# Patient Record
Sex: Female | Born: 1950 | Race: Black or African American | Hispanic: No | Marital: Married | Smoking: Never smoker
Health system: Southern US, Community
[De-identification: ages and names within clinical notes are randomized; demographics above are authoritative.]

## PROBLEM LIST (undated history)

## (undated) DIAGNOSIS — I10 Essential (primary) hypertension: Secondary | ICD-10-CM

## (undated) DIAGNOSIS — E7800 Pure hypercholesterolemia, unspecified: Secondary | ICD-10-CM

## (undated) HISTORY — PX: TUBAL LIGATION: SHX77

## (undated) HISTORY — PX: CHOLECYSTECTOMY: SHX55

## (undated) HISTORY — DX: Pure hypercholesterolemia, unspecified: E78.00

---

## 1999-02-07 ENCOUNTER — Ambulatory Visit (HOSPITAL_COMMUNITY): Admission: RE | Admit: 1999-02-07 | Discharge: 1999-02-07 | Payer: Self-pay

## 2000-01-11 ENCOUNTER — Other Ambulatory Visit: Admission: RE | Admit: 2000-01-11 | Discharge: 2000-01-11 | Payer: Self-pay | Admitting: Obstetrics and Gynecology

## 2000-03-31 ENCOUNTER — Encounter: Payer: Self-pay | Admitting: Obstetrics and Gynecology

## 2000-03-31 ENCOUNTER — Ambulatory Visit (HOSPITAL_COMMUNITY): Admission: RE | Admit: 2000-03-31 | Discharge: 2000-03-31 | Payer: Self-pay | Admitting: Obstetrics and Gynecology

## 2001-11-26 ENCOUNTER — Encounter: Payer: Self-pay | Admitting: Obstetrics and Gynecology

## 2001-11-26 ENCOUNTER — Ambulatory Visit (HOSPITAL_COMMUNITY): Admission: RE | Admit: 2001-11-26 | Discharge: 2001-11-26 | Payer: Self-pay | Admitting: Obstetrics and Gynecology

## 2001-12-11 ENCOUNTER — Other Ambulatory Visit: Admission: RE | Admit: 2001-12-11 | Discharge: 2001-12-11 | Payer: Self-pay | Admitting: *Deleted

## 2002-08-28 ENCOUNTER — Inpatient Hospital Stay (HOSPITAL_COMMUNITY): Admission: EM | Admit: 2002-08-28 | Discharge: 2002-08-31 | Payer: Self-pay | Admitting: Emergency Medicine

## 2002-08-30 ENCOUNTER — Encounter: Payer: Self-pay | Admitting: Cardiology

## 2003-12-07 ENCOUNTER — Other Ambulatory Visit: Admission: RE | Admit: 2003-12-07 | Discharge: 2003-12-07 | Payer: Self-pay | Admitting: Obstetrics and Gynecology

## 2004-08-08 ENCOUNTER — Ambulatory Visit (HOSPITAL_COMMUNITY): Admission: RE | Admit: 2004-08-08 | Discharge: 2004-08-08 | Payer: Self-pay | Admitting: Nurse Practitioner

## 2004-10-25 ENCOUNTER — Encounter: Admission: RE | Admit: 2004-10-25 | Discharge: 2004-10-25 | Payer: Self-pay | Admitting: Family Medicine

## 2004-12-05 ENCOUNTER — Ambulatory Visit (HOSPITAL_COMMUNITY): Admission: RE | Admit: 2004-12-05 | Discharge: 2004-12-05 | Payer: Self-pay | Admitting: Gastroenterology

## 2004-12-05 ENCOUNTER — Encounter (INDEPENDENT_AMBULATORY_CARE_PROVIDER_SITE_OTHER): Payer: Self-pay | Admitting: *Deleted

## 2005-06-23 ENCOUNTER — Emergency Department (HOSPITAL_COMMUNITY): Admission: EM | Admit: 2005-06-23 | Discharge: 2005-06-23 | Payer: Self-pay | Admitting: Family Medicine

## 2005-07-22 ENCOUNTER — Ambulatory Visit (HOSPITAL_COMMUNITY): Admission: RE | Admit: 2005-07-22 | Discharge: 2005-07-22 | Payer: Self-pay | Admitting: Family Medicine

## 2005-08-26 ENCOUNTER — Other Ambulatory Visit: Admission: RE | Admit: 2005-08-26 | Discharge: 2005-08-26 | Payer: Self-pay | Admitting: Obstetrics and Gynecology

## 2006-02-03 ENCOUNTER — Ambulatory Visit (HOSPITAL_COMMUNITY): Admission: RE | Admit: 2006-02-03 | Discharge: 2006-02-03 | Payer: Self-pay | Admitting: Family Medicine

## 2006-10-27 ENCOUNTER — Other Ambulatory Visit: Admission: RE | Admit: 2006-10-27 | Discharge: 2006-10-27 | Payer: Self-pay | Admitting: Obstetrics & Gynecology

## 2007-05-18 ENCOUNTER — Ambulatory Visit (HOSPITAL_COMMUNITY): Admission: RE | Admit: 2007-05-18 | Discharge: 2007-05-18 | Payer: Self-pay | Admitting: Family Medicine

## 2007-06-24 ENCOUNTER — Ambulatory Visit: Payer: Self-pay | Admitting: Cardiology

## 2007-07-01 ENCOUNTER — Ambulatory Visit: Payer: Self-pay

## 2007-10-20 ENCOUNTER — Other Ambulatory Visit: Admission: RE | Admit: 2007-10-20 | Discharge: 2007-10-20 | Payer: Self-pay | Admitting: Obstetrics and Gynecology

## 2008-04-15 ENCOUNTER — Emergency Department (HOSPITAL_COMMUNITY): Admission: EM | Admit: 2008-04-15 | Discharge: 2008-04-15 | Payer: Self-pay | Admitting: Emergency Medicine

## 2008-05-18 ENCOUNTER — Ambulatory Visit (HOSPITAL_COMMUNITY): Admission: RE | Admit: 2008-05-18 | Discharge: 2008-05-18 | Payer: Self-pay | Admitting: Internal Medicine

## 2009-06-23 ENCOUNTER — Ambulatory Visit (HOSPITAL_COMMUNITY): Admission: RE | Admit: 2009-06-23 | Discharge: 2009-06-23 | Payer: Self-pay | Admitting: Internal Medicine

## 2010-03-01 ENCOUNTER — Telehealth (INDEPENDENT_AMBULATORY_CARE_PROVIDER_SITE_OTHER): Payer: Self-pay | Admitting: *Deleted

## 2010-07-25 ENCOUNTER — Ambulatory Visit (HOSPITAL_COMMUNITY): Admission: RE | Admit: 2010-07-25 | Discharge: 2010-07-25 | Payer: Self-pay | Admitting: Internal Medicine

## 2010-11-29 NOTE — Progress Notes (Signed)
  Faxed Stress over to [facility] [phone] [facility]  Mar 01, 2010 2:20 PM

## 2011-03-12 NOTE — Assessment & Plan Note (Signed)
[facility]                            CARDIOLOGY OFFICE NOTE   Kathryn Mendez, Kathryn Mendez                MRN:          [id number]  DATE:06/24/2007                            DOB:          Aug 18, 1951    Kathryn Mendez has had chest discomfort.  She is referred by Dr.  Andi Devon for cardiac evaluation.  In the past, Kathryn Mendez  has had some chest pain.  There is no proven coronary disease.  She had  a Cardiolite in the past and a 2D echo.  Also, she has a history of  chronically elevated troponins in the range of 0.18.  She has a history  of hypertension, and peripheral edema, and has been on a diuretic.  The  patient does do physical work.  Recently, she has had localized chest  discomfort in the left anterior chest.  It comes and goes.  It may last  for a few minutes.  There was a question of some radiation to the left  arm.  She has had no nausea, vomiting, or diaphoresis.  There has been  no significant shortness of breath with it.  An EKG done last week  showed no diagnostic changes.  She is now here for further evaluation.  Other risk factors include her history of hypertension.  It is of  interest that she historically has a high HDL.   PAST MEDICAL HISTORY:   ALLERGIES:  No known drug allergies.   MEDICATIONS:  1. Aspirin 81 every other day.  2. Potassium 20.  3. Lotrel 10/20.  4. Furosemide 20 daily.  5. Toprol XL 100.  6. Vitamin D.  7. Teveten 600 mg daily.   OTHER MEDICAL PROBLEMS:  See the list below.   REVIEW OF SYSTEMS:  Other than the chest discomfort described in the  history of present illness, her review of systems is negative.   PHYSICAL EXAMINATION:  Blood pressure is 130/74 with a pulse of 64.  The patient is oriented to person, time, and place.  Affect is normal.  HEENT:  Reveals no xanthelasma.  She has no carotid bruits.  There is no  jugular venous distension.  LUNGS:  Clear.  Respiratory effort is  not labored.  CARDIAC EXAM:  Reveals an S1 with an S2.  There are no clicks or  significant murmurs.  Her abdomen is soft.  There are no masses or bruits.  She has normal  bowel sounds.  She does have 1+ edema, slightly greater in the right ankle than the  left ankle.   Her EKG from June 11, 2007 shows no acute abnormalities.   I also have labs that had been done earlier dated May 08, 2007.  Her  total LDL was 164.  Her HDL was 70.  Triglycerides were 58.  BUN was 19  with a creatinine of 0.87, potassium was 3.7, hemoglobin was 13.7.   PROBLEMS:  1. History of a chronically elevated troponin in the range of 0.18.  2. History of labile hypertension.  Blood pressure is nicely      controlled at this time.  3. History of chronic  peripheral edema, on diuretics, and this is      relatively stable.  4. History of elevated HDL.  5. Most recent elevated LDL, and this will need followup.  6. History in the past of normal left ventricular function.  7. Current chest discomfort that she has had intermittently for      several weeks.  At this point, I do not believe she has unstable      cardiac pain.  However, we need to assess further whether this      current pain is ischemic or not.  I believe she is stable for an      exercise test.  We will arrange for an adenosine Myoview scan.  I      will then see her for followup after this.     Luis Abed, MD, [facility]  Electronically Signed    JDK/MedQ  DD: 06/24/2007  DT: 06/25/2007  Job #: [id number]   cc:   Merlene Laughter. Renae Gloss, M.D.

## 2011-03-15 NOTE — Assessment & Plan Note (Signed)
[facility]                            CARDIOLOGY OFFICE NOTE   [name], [name]                MRN:          [id number]  DATE:07/01/2007                            DOB:          [birth year]    The patient has had a nuclear scan which shows no ischemia.  Review of  the raw data of her study reveals that there is uptake in her left  breast.  Etiology is not known but this is a concerning finding.  I have  called Dr. Selena Batten Shelton's office and spoken to her nurse.  I explained  this finding and the nurse will let Dr. Renae Gloss know who will follow up  next week concerning further evaluation of the patient's breast.   We are also in the process of being in touch with the patient, herself,  this afternoon to make sure that she knows that she will need breast  followup through Dr. Mathews Robinsons office.     Luis Abed, MD, [facility]  Electronically Signed    JDK/MedQ  DD: 07/02/2007  DT: 07/02/2007  Job #: [id number]

## 2011-03-15 NOTE — H&P (Signed)
NAME:  Kathryn Mendez, Kathryn Mendez NO.:  [id number]   MEDICAL RECORD NO.:  [id number]                   PATIENT TYPE:  EMS   LOCATION:  MAJO                                 FACILITY:  [facility]   PHYSICIAN:  Willa Rough, M.D. [facility]              DATE OF BIRTH:  04-02-1951   DATE OF ADMISSION:  08/27/2002  DATE OF DISCHARGE:                                HISTORY & PHYSICAL   HISTORY:  The patient has had some chest discomfort for two weeks.  She was  encouraged to come to the emergency room midday today and came in this  evening.  While here, her blood pressure is 200/100 and she is volume  overloaded with significant peripheral edema.  There is no evidence of an  acute MI. However, considering all of the issues above, it is necessary to  admit her for further evaluation and treatment.   PAST MEDICAL HISTORY:  A. ALLERGIES:  NONE KNOWN.  B.  MEDICATIONS:  She is currently on Allegra, Singulair, Prinizide  (combination of Lisinopril and Hydrochlorothiazide), and Toprol XL 25 mg  started today.  C.  Old Medical problems:  See the complete problem list below.   SOCIAL HISTORY:  The patient does not smoke or use street drugs.  She does  have occasional alcohol.   FAMILY HISTORY:  There is a family history of coronary disease with the  father having an MI in his 16s.   REVIEW OF SYSTEMS:  She has had no fevers or chills.  There has been no  obvious cough.  She has had ankle swelling.  She has had no major headaches  and no syncopal or presyncopal changes.  She has had no problems with her  vision.  There have been no major GI or GU problems.  The remainder of her  review of systems is negative.   PHYSICAL EXAMINATION:  VITAL SIGNS:  Blood pressure in the emergency room is  198/100.  Temperature is 97.8, respirations are 18, and her pulse rate is  85.  GENERAL:  Overall, she is stable.  She seems minimally uncomfortable from  her chest discomfort and has a hard time  describing exactly what her  problems are this evening.  LUNGS:  Reveal no obvious rales.  NECK:  No bruits.  CARDIAC:  S1 with an S2, but no clicks or significant murmurs.  ABDOMEN:  Mildly distended, but soft.  Bowel sounds are normal.  EXTREMITIES:  She does have 2+ peripheral edema in the lower extremities.   Chest x-ray is pending at this time.  EKG reveals nonspecific ST changes.  All of her blood studies are still pending.   IMPRESSION:  1. Hypertension.  The patient is on hypertensive meds.  She claims that her     pressure became worse when her Prinizide was changed to generic     Lisinopril/Hydrochlorothiazide and possibly worse when she was started on  Allegra and Singulair.  I did speak with her about salt and fluid intake.     She does drink an excessive amount of fluid and this may play a role in     some of her problems.  2. Allergic symptoms.  For this she was started on Allegra and Singulair.  3. Chest pressure for two weeks.  This does not seem classic for cardiac     ischemia, but will require further workup.  It is difficult to decide if     the pain is worse sitting up or lying down.  4. Peripheral edema.  This is worse over the last two weeks.  I am not sure     if this is related to changing cardiac function or purely her salt and     fluid intake and variation in her medications.  5. Significant family history of coronary disease.   The patient has the above constellation of issues.  It is still not clear to  me what her primary issue is this evening.  However, we do need to rule out  an MI and to treat her blood pressure.  Blood pressure will be treated.  She  will need other medications along with more powerful diuretics.  We will  check a series of cardiac enzymes.  Chest x-ray will be done.  Cardiolite  scan will be ordered for 08/29/2002, and a 2-D echo for 08/30/2002.  We will  talk to her about watching her salt and fluid intake over time.  When we   have the above studies done, we can decide if further cardiac workup is  necessary or merely adjusting medications and then sending her home for  careful follow up.                                                Willa Rough, M.D. [facility]    JK/MEDQ  D:  08/28/2002  T:  08/28/2002  Job:  [id number]   cc:   Olena Leatherwood [facility]

## 2011-03-15 NOTE — Discharge Summary (Signed)
NAME:  Kathryn Mendez, Kathryn Mendez NO.:  [id number]   MEDICAL RECORD NO.:  [id number]                   PATIENT TYPE:  INP   LOCATION:  3736                                 FACILITY:  [facility]   PHYSICIAN:  Willa Rough, M.D. [facility]              DATE OF BIRTH:  Sep 16, 1951   DATE OF ADMISSION:  08/27/2002  DATE OF DISCHARGE:  08/31/2002                           DISCHARGE SUMMARY - REFERRING   PROCEDURES:  Adenosine Cardiolite 08/30/2002.   REASON FOR ADMISSION:  The patient is a 60 year old female with no prior  history of heart disease who presented to [facility] Room with  progressive chest discomfort over the preceding two weeks.  She was found to  have elevated blood pressure on admission (200/100) and evidence of  significant peripheral edema.  She was admitted for rule MI and management  of uncontrolled hypertension as well as further diagnostic evaluation.  Please refer to dictated admission not for full details.   LABORATORY DATA:  CBC normal.  Sodium 134, potassium 3.0 on admission;  sodium 138, potassium 3.6 at discharge.  Normal renal function.  Glucose 137  on admission, followup within normal limits.  Normal liver enzymes.  Cardiac  enzymes: CPK-MB normal x 3; peak troponin I was 0.18 (on admission).  TSH  3.27.   Admission chest x-ray showed no active disease.   HOSPITAL COURSE:  Serial cardiac enzymes were performed revealing normal CPK-  MB levels with mildly elevated troponin I (peak 0.18) on admission.  Initial  recommendation was to proceed with noninvasive diagnostic testing: 2-D  echocardiography and pharmacologic stress testing. However, low threshold  was maintained to proceed with diagnostic coronary angiography if there were  any abnormality on the perfusion imaging study.   The 2-D echocardiogram revealed normal left ventricular function with no  significant valvular abnormalities.  The right ventricle was also within  normal  limits.   Adenosine Cardiolite was negative for ischemia, ejection fraction 81%.  There was the question of mild anterolateral graft attenuation.   The patient had no return of chest pain following admission and was  ambulating without complaints by time of discharge on hospital day #3.   Medications were adjusted at the outset with discontinuation of Prinizide  and subsequently treated with stronger diuretic, Lasix.  The patient was  initially treated with IV Lasix 40 mg and subsequently continued on oral  dose.  Recommendation at time of discharge was to cut this back to a  maintenance dose of 20 mEq q.d.   Hypokalemia (3.0) was corrected with supplementation.  Potassium was 3.6 at  time of discharge.   The patient was also started on Norvasc at 5 mg q.d.  Her blood pressure  responded nicely to these therapeutic interventions with normalization of  her blood pressure at time of discharge.   The patient also had significant improvement in her lower extremity edema  with no significant swelling noted at time of  scheduled discharge.   Final recommendations at time of discharge were to proceed with a followup  metabolic profile in one week for monitoring of potassium levels.   Following full review of all of the diagnostic test results, no further  cardiac workup was recommended.  Recommendations are to proceed with  aggressive management of blood pressure and volume status.   DISCHARGE MEDICATIONS:  1. Norvasc 5 mg q.d. (new).  2. Lasix 20 mg q.d. (new).  3. K-Dur 20 mEq q.d. (new).  4. Lisinopril 20 mg q.d.  5. Toprol XL 25 mg q.d.  6. Coated aspirin 81 mg q.d. (new).  7. Allegra 60 mg as directed.   DISCHARGE INSTRUCTIONS:  The patient is to return to baseline level of  activity as tolerated.  She is to refrain from adding salt to her diet.   The patient is instructed to follow up with Dr. Clydie Braun L. Hal Hope, of [employer], in approximately one week.   She will need a followup  metabolic profile at that time for monitoring of hypokalemia.  The patient  is scheduled to follow up with Dr. Willa Rough on Monday, November 24, at  10:45 a.m.   DISCHARGE DIAGNOSES:  1. Nonischemic chest pain.     a. Normal adenosine Cardiolite; ejection fraction 81% on November 3.     b. Normal 2-D echocardiogram.     c. Mildly elevated troponin markers of undetermined etiology.  2. Labile hypertension.  3. Peripheral edema.  4. History of elevated high density lipoprotein (HDL).     Gene Serpe, P.A. [facility]                      Willa Rough, M.D. [facility]    GS/MEDQ  D:  08/31/2002  T:  08/31/2002  Job:  [id number]   cc:   Clydie Braun L. Hal Hope, M.D.  [address] [place]  [place] [address]  Fax: [phone]

## 2011-03-15 NOTE — Op Note (Signed)
NAME:  Kathryn Mendez, Kathryn Mendez         ACCOUNT NO.:  [id number]   MEDICAL RECORD NO.:  [id number]          PATIENT TYPE:  AMB   LOCATION:  ENDO                         FACILITY:  [facility]   PHYSICIAN:  Anselmo Rod, M.D.  DATE OF BIRTH:  July 02, 1951   DATE OF PROCEDURE:  12/05/2004  DATE OF DISCHARGE:                                 OPERATIVE REPORT   PROCEDURE PERFORMED:  Colonoscopy with cold biopsies x4.   ENDOSCOPIST:  Anselmo Rod, M.D.   INSTRUMENT USED:  Olympus video colonoscope.   INDICATIONS FOR PROCEDURE:  A 60 year old, African-American female underwent  screening colonoscopy to rule out colonic polyps, masses, etc.   PREPROCEDURE PREPARATION:  Informed consent was procured from the patient.  The patient was fasted for eight hours prior to the procedure and prepped  with a bottle of magnesium citrate and a gallon of GoLYTELY the night prior  to the procedure.  Risks and benefits of the procedure, including a 10% miss  rate of cancer and polyps, were discussed with the patient as well.  The  patient received 1 g of Ancef for prophylaxis prior to the procedure.   PREPROCEDURE PHYSICAL:  VITAL SIGNS:  The patient had stable vital signs.  NECK:  Supple.  CHEST:  Clear to auscultation.  CARDIOVASCULAR:  S1, S2 regular.  ABDOMEN:  Soft, with normal bowel sounds.   DESCRIPTION OF THE PROCEDURE:  The patient was placed in the left lateral  decubitus position, sedated with 50 mg of Demerol and 5 mg of Versed in slow  incremental doses.  Once the patient was adequately sedated and maintained  on low-flow oxygen and continuous cardiac monitoring, the Olympus video  colonoscope was advanced from the rectum to the cecum.  The appendiceal  orifice and the ileocecal valve were clearly visualized and photographed.  A  small sessile polyp was biopsied from the mid-right colon, another small  sessile polyp biopsied from the rectum.  Prominent internal hemorrhoids were  appreciated on  retroflexion in the rectum.  The patient tolerated the  procedure well, without immediate complications.   IMPRESSION:  1.  Two polyps removed from the colon, one from the mid-right colon, one      from the rectum.  2.  Prominent internal hemorrhoids.  3.  No large masses or polyps seen.  4.  No evidence of diverticulosis.   RECOMMENDATIONS:  1.  Await pathology results.  2.  Avoid nonsteroidals, including aspirin, for the next two weeks.  3.  Repeat colonoscopy depending on pathology results.      JNM/MEDQ  D:  12/05/2004  T:  12/05/2004  Job:  [id number]   cc:   Saul Fordyce, PA-C   Ernestina Penna, M.D.  [address] [place]  [place] [address]  Fax: [phone]

## 2012-02-05 ENCOUNTER — Other Ambulatory Visit (HOSPITAL_COMMUNITY): Payer: Self-pay | Admitting: Internal Medicine

## 2012-02-05 DIAGNOSIS — Z1231 Encounter for screening mammogram for malignant neoplasm of breast: Secondary | ICD-10-CM

## 2012-03-03 ENCOUNTER — Ambulatory Visit (HOSPITAL_COMMUNITY)
Admission: RE | Admit: 2012-03-03 | Discharge: 2012-03-03 | Disposition: A | Discharge status: Home / Self Care | Payer: BC Managed Care – PPO | Source: Ambulatory Visit | Attending: Internal Medicine | Admitting: Internal Medicine

## 2012-03-03 DIAGNOSIS — Z1231 Encounter for screening mammogram for malignant neoplasm of breast: Secondary | ICD-10-CM | POA: Insufficient documentation

## 2013-01-22 ENCOUNTER — Other Ambulatory Visit (HOSPITAL_COMMUNITY): Payer: Self-pay | Admitting: Internal Medicine

## 2013-01-22 DIAGNOSIS — Z1231 Encounter for screening mammogram for malignant neoplasm of breast: Secondary | ICD-10-CM

## 2013-02-11 DIAGNOSIS — R739 Hyperglycemia, unspecified: Secondary | ICD-10-CM | POA: Insufficient documentation

## 2013-03-05 ENCOUNTER — Ambulatory Visit (HOSPITAL_COMMUNITY)
Admission: RE | Admit: 2013-03-05 | Discharge: 2013-03-05 | Disposition: A | Discharge status: Home / Self Care | Payer: BC Managed Care – PPO | Source: Ambulatory Visit | Attending: Internal Medicine | Admitting: Internal Medicine

## 2013-03-05 DIAGNOSIS — Z1231 Encounter for screening mammogram for malignant neoplasm of breast: Secondary | ICD-10-CM | POA: Insufficient documentation

## 2013-11-20 ENCOUNTER — Emergency Department (HOSPITAL_COMMUNITY)
Admission: EM | Admit: 2013-11-20 | Discharge: 2013-11-20 | Disposition: A | Discharge status: Home / Self Care | Payer: BC Managed Care – PPO | Source: Home / Self Care | Attending: Emergency Medicine | Admitting: Emergency Medicine

## 2013-11-20 ENCOUNTER — Encounter (HOSPITAL_COMMUNITY): Payer: Self-pay | Admitting: Emergency Medicine

## 2013-11-20 DIAGNOSIS — L509 Urticaria, unspecified: Secondary | ICD-10-CM

## 2013-11-20 HISTORY — DX: Essential (primary) hypertension: I10

## 2013-11-20 MED ORDER — PREDNISONE 10 MG PO KIT: PACK | ORAL | Status: DC

## 2013-11-20 MED ORDER — CETIRIZINE HCL 10 MG PO TABS
10.0000 mg | ORAL_TABLET | Freq: Two times a day (BID) | ORAL | Status: DC | PRN
Start: 2013-11-20 — End: 2018-11-24

## 2013-11-20 MED ORDER — PREDNISONE 20 MG PO TABS
60.0000 mg | ORAL_TABLET | Freq: Once | ORAL | Status: AC
  Administered 2013-11-20: 60 mg via ORAL

## 2013-11-20 MED ORDER — PREDNISONE 20 MG PO TABS
ORAL_TABLET | ORAL | Status: AC
  Filled 2013-11-20: qty 3

## 2013-11-20 MED ORDER — RANITIDINE HCL 150 MG PO CAPS: 150.0000 mg | ORAL_CAPSULE | Freq: Two times a day (BID) | ORAL | Status: DC | PRN

## 2013-11-20 NOTE — ED Notes (Signed)
Started with raised, pruritic rash to entire body on Friday; has bathed in [facility] and applied rubbing alcohol.  Denies any changes in soaps, lotions, etc.

## 2013-11-20 NOTE — ED Provider Notes (Signed)
Medical screening examination/treatment/procedure(s) were performed by non-physician practitioner and as supervising physician I was immediately available for consultation/collaboration.  Philipp Deputy, M.D.   Harden Mo, MD 11/20/13 [phone]

## 2013-11-20 NOTE — ED Provider Notes (Signed)
CSN: [id number]     Arrival date & time 11/20/13  0901 History   First MD Initiated Contact with Patient 11/20/13 [phone]     Chief Complaint  Patient presents with  . Urticaria   (Consider location/radiation/quality/duration/timing/severity/associated sxs/prior Treatment) HPI Comments: 63 year old female presents complaining of rash. For 2 days, she has had an itchy, raised, red rash all over her skin. It started on her arms and has since spread over her entire trunk, arms, legs. It is red and itchy. She denies shortness of breath, wheezing, diarrhea. She has no known exposure to any allergens. She has never had this before, although she did get a mild rash due to a detergent years ago.  Patient is a 63 y.o. female presenting with urticaria.  Urticaria Pertinent negatives include no chest pain, no abdominal pain and no shortness of breath.    Past Medical History  Diagnosis Date  . Hypertension    Past Surgical History  Procedure Laterality Date  . Cholecystectomy    . Tubal ligation     History reviewed. No pertinent family history. History  Substance Use Topics  . Smoking status: Never Smoker   . Smokeless tobacco: Not on file  . Alcohol Use: Yes     Comment: occasional   OB History   Grav Para Term Preterm Abortions TAB SAB Ect Mult Living                 Review of Systems  Constitutional: Negative for fever and chills.  Eyes: Negative for visual disturbance.  Respiratory: Negative for cough and shortness of breath.   Cardiovascular: Negative for chest pain, palpitations and leg swelling.  Gastrointestinal: Negative for nausea, vomiting and abdominal pain.  Endocrine: Negative for polydipsia and polyuria.  Genitourinary: Negative for dysuria, urgency and frequency.  Musculoskeletal: Negative for arthralgias and myalgias.  Skin: Positive for rash.  Neurological: Negative for dizziness, weakness and light-headedness.    Allergies  Other  Home Medications    Current Outpatient Rx  Name  Route  Sig  Dispense  Refill  . amLODipine (NORVASC) 5 MG tablet   Oral   Take 5 mg by mouth daily.         Marland Kitchen aspirin 81 MG tablet   Oral   Take 81 mg by mouth daily.         . metoprolol succinate (TOPROL-XL) 100 MG 24 hr tablet   Oral   Take 100 mg by mouth daily. Take with or immediately following a meal.         . valsartan-hydrochlorothiazide (DIOVAN-HCT) 320-25 MG per tablet   Oral   Take 1 tablet by mouth daily.         . cetirizine (ZYRTEC) 10 MG tablet   Oral   Take 1 tablet (10 mg total) by mouth 2 (two) times daily as needed (for itching or rash).   30 tablet   0   . PredniSONE 10 MG KIT      12 day taper dose pack. Use as directed   48 each   0   . ranitidine (ZANTAC) 150 MG capsule   Oral   Take 1 capsule (150 mg total) by mouth 2 (two) times daily as needed (for itching or rash).   30 capsule   0    BP 170/75  Pulse 95  Temp(Src) 97.6 F (36.4 C) (Oral)  Resp 16  SpO2 96% Physical Exam  Nursing note and vitals reviewed. Constitutional: She is oriented to  person, place, and time. Vital signs are normal. She appears well-developed and well-nourished. No distress.  HENT:  Head: Normocephalic and atraumatic.  Pulmonary/Chest: Effort normal and breath sounds normal. No accessory muscle usage. Not tachypneic. No respiratory distress. She has no wheezes.  Neurological: She is alert and oriented to person, place, and time. She has normal strength. Coordination normal.  Skin: Skin is warm and dry. Rash noted. Rash is urticarial (diffuse). She is not diaphoretic.  Psychiatric: She has a normal mood and affect. Judgment normal.    ED Course  Procedures (including critical care time) Labs Review Labs Reviewed - No data to display Imaging Review No results found.    MDM   1. Urticaria    Giving 60 mg of prednisone here, discharge and with prednisone, Zyrtec, Zantac. Followup if there is any worsening.  New  Prescriptions   CETIRIZINE (ZYRTEC) 10 MG TABLET    Take 1 tablet (10 mg total) by mouth 2 (two) times daily as needed (for itching or rash).   PREDNISONE 10 MG KIT    12 day taper dose pack. Use as directed   RANITIDINE (ZANTAC) 150 MG CAPSULE    Take 1 capsule (150 mg total) by mouth 2 (two) times daily as needed (for itching or rash).     Liam Graham, PA-C 11/20/13 [phone]

## 2013-11-20 NOTE — Discharge Instructions (Signed)

## 2014-02-15 ENCOUNTER — Other Ambulatory Visit (HOSPITAL_COMMUNITY): Payer: Self-pay | Admitting: Internal Medicine

## 2014-02-15 DIAGNOSIS — Z1231 Encounter for screening mammogram for malignant neoplasm of breast: Secondary | ICD-10-CM

## 2014-03-07 ENCOUNTER — Ambulatory Visit (HOSPITAL_COMMUNITY)
Admission: RE | Admit: 2014-03-07 | Discharge: 2014-03-07 | Disposition: A | Discharge status: Home / Self Care | Payer: BC Managed Care – PPO | Source: Ambulatory Visit | Attending: Internal Medicine | Admitting: Internal Medicine

## 2014-03-07 DIAGNOSIS — Z1231 Encounter for screening mammogram for malignant neoplasm of breast: Secondary | ICD-10-CM | POA: Insufficient documentation

## 2015-03-07 ENCOUNTER — Other Ambulatory Visit (HOSPITAL_COMMUNITY): Payer: Self-pay | Admitting: Internal Medicine

## 2015-03-07 DIAGNOSIS — Z1231 Encounter for screening mammogram for malignant neoplasm of breast: Secondary | ICD-10-CM

## 2015-03-13 ENCOUNTER — Ambulatory Visit (HOSPITAL_COMMUNITY)
Admission: RE | Admit: 2015-03-13 | Discharge: 2015-03-13 | Disposition: A | Discharge status: Home / Self Care | Payer: BLUE CROSS/BLUE SHIELD | Source: Ambulatory Visit | Attending: Internal Medicine | Admitting: Internal Medicine

## 2015-03-13 ENCOUNTER — Encounter (INDEPENDENT_AMBULATORY_CARE_PROVIDER_SITE_OTHER): Payer: Self-pay

## 2015-03-13 DIAGNOSIS — Z1231 Encounter for screening mammogram for malignant neoplasm of breast: Secondary | ICD-10-CM

## 2016-02-02 ENCOUNTER — Other Ambulatory Visit: Payer: Self-pay

## 2016-02-02 DIAGNOSIS — Z1231 Encounter for screening mammogram for malignant neoplasm of breast: Secondary | ICD-10-CM

## 2016-03-13 ENCOUNTER — Ambulatory Visit
Admission: RE | Admit: 2016-03-13 | Discharge: 2016-03-13 | Disposition: A | Discharge status: Home / Self Care | Payer: BLUE CROSS/BLUE SHIELD | Source: Ambulatory Visit

## 2016-03-13 DIAGNOSIS — Z1231 Encounter for screening mammogram for malignant neoplasm of breast: Secondary | ICD-10-CM

## 2016-08-19 ENCOUNTER — Other Ambulatory Visit: Payer: Self-pay | Admitting: Obstetrics and Gynecology

## 2016-08-19 DIAGNOSIS — D259 Leiomyoma of uterus, unspecified: Secondary | ICD-10-CM | POA: Insufficient documentation

## 2016-09-01 ENCOUNTER — Encounter (HOSPITAL_COMMUNITY): Payer: Self-pay | Admitting: Family Medicine

## 2016-09-01 ENCOUNTER — Ambulatory Visit (HOSPITAL_COMMUNITY)
Admission: EM | Admit: 2016-09-01 | Discharge: 2016-09-01 | Disposition: A | Discharge status: Home / Self Care | Payer: BLUE CROSS/BLUE SHIELD | Attending: Emergency Medicine | Admitting: Emergency Medicine

## 2016-09-01 DIAGNOSIS — R221 Localized swelling, mass and lump, neck: Secondary | ICD-10-CM

## 2016-09-01 DIAGNOSIS — R220 Localized swelling, mass and lump, head: Secondary | ICD-10-CM

## 2016-09-01 DIAGNOSIS — T783XXA Angioneurotic edema, initial encounter: Secondary | ICD-10-CM

## 2016-09-01 MED ORDER — DEXAMETHASONE SODIUM PHOSPHATE 10 MG/ML IJ SOLN
INTRAMUSCULAR | Status: AC
  Filled 2016-09-01: qty 1

## 2016-09-01 MED ORDER — DEXAMETHASONE SODIUM PHOSPHATE 10 MG/ML IJ SOLN
8.0000 mg | Freq: Once | INTRAMUSCULAR | Status: AC
  Administered 2016-09-01: 8 mg via INTRAMUSCULAR

## 2016-09-01 MED ORDER — DIPHENHYDRAMINE HCL 50 MG/ML IJ SOLN
25.0000 mg | Freq: Once | INTRAMUSCULAR | Status: AC
  Administered 2016-09-01: 25 mg via INTRAMUSCULAR

## 2016-09-01 MED ORDER — METHYLPREDNISOLONE ACETATE 40 MG/ML IJ SUSP
INTRAMUSCULAR | Status: AC
  Filled 2016-09-01: qty 1

## 2016-09-01 MED ORDER — METHYLPREDNISOLONE ACETATE 40 MG/ML IJ SUSP
40.0000 mg | Freq: Once | INTRAMUSCULAR | Status: AC
  Administered 2016-09-01: 40 mg via INTRAMUSCULAR

## 2016-09-01 MED ORDER — DIPHENHYDRAMINE HCL 50 MG/ML IJ SOLN
INTRAMUSCULAR | Status: AC
Start: 2016-09-01 — End: 2016-09-01
  Filled 2016-09-01: qty 1

## 2016-09-01 MED ORDER — PREDNISONE 10 MG PO TABS: 20.0000 mg | ORAL_TABLET | Freq: Every day | ORAL | 0 refills | Status: DC

## 2016-09-01 NOTE — ED Provider Notes (Signed)
CSN: [id number]     Arrival date & time 09/01/16  1159 History   First MD Initiated Contact with Patient 09/01/16 1215     Chief Complaint  Patient presents with  . Oral Swelling   (Consider location/radiation/quality/duration/timing/severity/associated sxs/prior Treatment) 65 year old female states that this morning she developed a sensation of swelling of the tongue and in the upper throat. Denies cough or trouble breathing. Denies sense of wheezing or peripheral swelling. Denies chest pain, shortness of breath or rash. She states a similar sensation of swelling occurred about a week ago and lasted 2-3 days. She states it was not quite as bad as she perceives this to be today. She has a history of hypertension and takes 3 medications, amlodipine, Toprol XL 100 mg and valsartan/hydrochlorothiazide 320-25. She denies ever having a similar reaction to medications, foods or other known substances. She otherwise feels generally well.      Past Medical History:  Diagnosis Date  . Hypertension    Past Surgical History:  Procedure Laterality Date  . CHOLECYSTECTOMY    . TUBAL LIGATION     History reviewed. No pertinent family history. Social History  Substance Use Topics  . Smoking status: Never Smoker  . Smokeless tobacco: Never Used  . Alcohol use Yes     Comment: occasional   OB History    No data available     Review of Systems  Constitutional: Negative.  Negative for fatigue and fever.  HENT: Negative for congestion, ear discharge, mouth sores, nosebleeds, sinus pressure, sore throat and trouble swallowing.        Sense of swelling to the right lower jaw upper right anterior neck.  Eyes: Negative.   Respiratory: Negative.  Negative for cough, choking, chest tightness, shortness of breath, wheezing and stridor.   Cardiovascular: Negative for chest pain and leg swelling.  Gastrointestinal: Negative.   Skin: Negative.  Negative for rash.  Neurological: Negative.      Allergies  Other  Home Medications   Prior to Admission medications   Medication Sig Start Date End Date Taking? Authorizing Provider  amLODipine (NORVASC) 5 MG tablet Take 5 mg by mouth daily.    Historical Provider, MD  aspirin 81 MG tablet Take 81 mg by mouth daily.    Historical Provider, MD  cetirizine (ZYRTEC) 10 MG tablet Take 1 tablet (10 mg total) by mouth 2 (two) times daily as needed (for itching or rash). 11/20/13   Liam Graham, PA-C  metoprolol succinate (TOPROL-XL) 100 MG 24 hr tablet Take 100 mg by mouth daily. Take with or immediately following a meal.    Historical Provider, MD  PredniSONE 10 MG KIT 12 day taper dose pack. Use as directed 11/20/13   Liam Graham, PA-C  ranitidine (ZANTAC) 150 MG capsule Take 1 capsule (150 mg total) by mouth 2 (two) times daily as needed (for itching or rash). 11/20/13   Freeman Caldron Baker, PA-C  valsartan-hydrochlorothiazide (DIOVAN-HCT) 320-25 MG per tablet Take 1 tablet by mouth daily.    Historical Provider, MD   Meds Ordered and Administered this Visit   Medications  dexamethasone (DECADRON) injection 8 mg (8 mg Intramuscular Given 09/01/16 1331)  methylPREDNISolone acetate (DEPO-MEDROL) injection 40 mg (40 mg Intramuscular Given 09/01/16 1332)  diphenhydrAMINE (BENADRYL) injection 25 mg (25 mg Intramuscular Given 09/01/16 1333)    BP 165/84   Pulse 68   Temp 97.5 F (36.4 C)   Resp 18   SpO2 100%  No data found.  Physical Exam  Constitutional: She is oriented to person, place, and time. She appears well-developed and well-nourished. No distress.  HENT:  Head: Normocephalic and atraumatic.  Right Ear: External ear normal.  Left Ear: External ear normal.  Nose: Nose normal.  Mouth/Throat: Oropharynx is clear and moist. No oropharyngeal exudate.  Initial exam of the tongue reveals no obvious swelling. More detailed exam revealed slight widening of the tongue on the left and right, greater on the right. This is  relatively nominal. The oropharynx is clear with evidence of clear PND. Soft palate without swelling erythema. The uvula with no edema, swelling or enlargement. Airway is widely patent. No stridor or other abnormal airway sounds. No swelling of the lips.  Eyes: Conjunctivae and EOM are normal.  Neck: Normal range of motion. Neck supple. No tracheal deviation present. No thyromegaly present.  No observed swelling or masses to the anterior neck.  Cardiovascular: Normal rate, regular rhythm, normal heart sounds and intact distal pulses.   Pulmonary/Chest: Effort normal and breath sounds normal. No respiratory distress. She has no wheezes. She has no rales. She exhibits no tenderness.  Musculoskeletal: She exhibits no edema.  Lymphadenopathy:    She has no cervical adenopathy.  Neurological: She is alert and oriented to person, place, and time. She exhibits normal muscle tone.  Skin: Skin is warm and dry. Capillary refill takes less than 2 seconds.  Psychiatric: She has a normal mood and affect. Her behavior is normal.  Nursing note and vitals reviewed.   Urgent Care Course   Clinical Course     Procedures (including critical care time)  Labs Review Labs Reviewed - No data to display  Imaging Review No results found.   Visual Acuity Review  Right Eye Distance:   Left Eye Distance:   Bilateral Distance:    Right Eye Near:   Left Eye Near:    Bilateral Near:         MDM   1. Angioedema, initial encounter    Prior to the patient's leaving she was observed standing at the counter for several minutes. The provider was performing a procedure and could not discharge at that time. The patient decided to leave without additional instructions. She was stable. No problems with breathing or airway. No additional symptoms. After the patient was examined approximately 30 minutes after sitting on the first time she was advised that it may be her medication causing the swelling of the  tongue and throat. She was asked to discuss this with her PCP as to whether she should continue this medicine or not. She was advised not to take it today or tomorrow. No other known stimulus, medication or food could be recalled for this reaction. To take prednisone daily for the next 4-5 days. 4 increase in swelling, tongue thickening, trouble breathing, cough, fever, rash or any respiratory difficulty seek medical attention promptly, call 911 or go to the emergency department. These instructions were given verbally however she did not wait to receive the written instructions.     Janne Napoleon, NP 09/01/16 1419

## 2016-09-01 NOTE — Discharge Instructions (Addendum)
After the patient was examined approximately 30 minutes after sitting on the first time she was advised that it may be her medication causing the swelling of the tongue and throat. She was asked to discuss this with her PCP as to whether she should continue this medicine or not. She was advised not to take it today or tomorrow. No other known stimulus, medication or food could be recalled for this reaction. To take prednisone daily for the next 4-5 days. 4 increase in swelling, tongue thickening, trouble breathing, cough, fever, rash or any respiratory difficulty seek medical attention promptly, call 911 or go to the emergency department.

## 2016-09-01 NOTE — ED Triage Notes (Addendum)
Pt here for tongue and throat swelling. sts started at church this am. sts that she used some new toothpaste this am. Denies any new meds. Denies eating this am. sts that she has a similar episode last week after eating a dried blueberry sts that it is hard to breathe and swallow.

## 2016-11-07 DIAGNOSIS — N182 Chronic kidney disease, stage 2 (mild): Secondary | ICD-10-CM | POA: Diagnosis not present

## 2016-11-07 DIAGNOSIS — H2513 Age-related nuclear cataract, bilateral: Secondary | ICD-10-CM | POA: Diagnosis not present

## 2016-11-07 DIAGNOSIS — I129 Hypertensive chronic kidney disease with stage 1 through stage 4 chronic kidney disease, or unspecified chronic kidney disease: Secondary | ICD-10-CM | POA: Diagnosis not present

## 2016-11-07 DIAGNOSIS — H524 Presbyopia: Secondary | ICD-10-CM | POA: Diagnosis not present

## 2016-12-25 DIAGNOSIS — R252 Cramp and spasm: Secondary | ICD-10-CM | POA: Diagnosis not present

## 2016-12-25 DIAGNOSIS — I129 Hypertensive chronic kidney disease with stage 1 through stage 4 chronic kidney disease, or unspecified chronic kidney disease: Secondary | ICD-10-CM | POA: Diagnosis not present

## 2016-12-25 DIAGNOSIS — Z79899 Other long term (current) drug therapy: Secondary | ICD-10-CM | POA: Diagnosis not present

## 2016-12-25 DIAGNOSIS — N182 Chronic kidney disease, stage 2 (mild): Secondary | ICD-10-CM | POA: Diagnosis not present

## 2017-03-12 DIAGNOSIS — I129 Hypertensive chronic kidney disease with stage 1 through stage 4 chronic kidney disease, or unspecified chronic kidney disease: Secondary | ICD-10-CM | POA: Diagnosis not present

## 2017-03-12 DIAGNOSIS — Z6831 Body mass index (BMI) 31.0-31.9, adult: Secondary | ICD-10-CM | POA: Diagnosis not present

## 2017-03-12 DIAGNOSIS — N182 Chronic kidney disease, stage 2 (mild): Secondary | ICD-10-CM | POA: Diagnosis not present

## 2017-03-12 DIAGNOSIS — R7309 Other abnormal glucose: Secondary | ICD-10-CM | POA: Diagnosis not present

## 2017-08-05 DIAGNOSIS — Z23 Encounter for immunization: Secondary | ICD-10-CM | POA: Diagnosis not present

## 2017-08-05 DIAGNOSIS — Z0000 Encounter for general adult medical examination without abnormal findings: Secondary | ICD-10-CM | POA: Diagnosis not present

## 2017-08-05 DIAGNOSIS — N182 Chronic kidney disease, stage 2 (mild): Secondary | ICD-10-CM | POA: Diagnosis not present

## 2017-08-05 DIAGNOSIS — R7309 Other abnormal glucose: Secondary | ICD-10-CM | POA: Diagnosis not present

## 2017-08-05 DIAGNOSIS — I129 Hypertensive chronic kidney disease with stage 1 through stage 4 chronic kidney disease, or unspecified chronic kidney disease: Secondary | ICD-10-CM | POA: Diagnosis not present

## 2017-08-05 DIAGNOSIS — E559 Vitamin D deficiency, unspecified: Secondary | ICD-10-CM | POA: Diagnosis not present

## 2017-08-13 ENCOUNTER — Other Ambulatory Visit: Payer: Self-pay | Admitting: Internal Medicine

## 2017-08-13 DIAGNOSIS — E2839 Other primary ovarian failure: Secondary | ICD-10-CM

## 2017-08-13 DIAGNOSIS — Z1231 Encounter for screening mammogram for malignant neoplasm of breast: Secondary | ICD-10-CM

## 2017-09-04 ENCOUNTER — Ambulatory Visit
Admission: RE | Admit: 2017-09-04 | Discharge: 2017-09-04 | Disposition: A | Discharge status: Home / Self Care | Payer: Medicare Other | Source: Ambulatory Visit | Attending: Internal Medicine | Admitting: Internal Medicine

## 2017-09-04 DIAGNOSIS — Z1231 Encounter for screening mammogram for malignant neoplasm of breast: Secondary | ICD-10-CM | POA: Diagnosis not present

## 2017-09-04 DIAGNOSIS — E2839 Other primary ovarian failure: Secondary | ICD-10-CM

## 2017-09-04 DIAGNOSIS — Z780 Asymptomatic menopausal state: Secondary | ICD-10-CM | POA: Diagnosis not present

## 2017-09-23 DIAGNOSIS — Z79899 Other long term (current) drug therapy: Secondary | ICD-10-CM | POA: Diagnosis not present

## 2017-09-23 DIAGNOSIS — Z7982 Long term (current) use of aspirin: Secondary | ICD-10-CM | POA: Diagnosis not present

## 2017-09-23 DIAGNOSIS — E785 Hyperlipidemia, unspecified: Secondary | ICD-10-CM | POA: Diagnosis not present

## 2017-11-10 DIAGNOSIS — H2513 Age-related nuclear cataract, bilateral: Secondary | ICD-10-CM | POA: Diagnosis not present

## 2017-11-10 DIAGNOSIS — H524 Presbyopia: Secondary | ICD-10-CM | POA: Diagnosis not present

## 2018-02-03 DIAGNOSIS — M79672 Pain in left foot: Secondary | ICD-10-CM | POA: Diagnosis not present

## 2018-02-03 DIAGNOSIS — E785 Hyperlipidemia, unspecified: Secondary | ICD-10-CM | POA: Diagnosis not present

## 2018-02-03 DIAGNOSIS — M79675 Pain in left toe(s): Secondary | ICD-10-CM | POA: Diagnosis not present

## 2018-02-03 DIAGNOSIS — R7309 Other abnormal glucose: Secondary | ICD-10-CM | POA: Diagnosis not present

## 2018-02-03 DIAGNOSIS — I10 Essential (primary) hypertension: Secondary | ICD-10-CM | POA: Diagnosis not present

## 2018-04-09 DIAGNOSIS — Z86010 Personal history of colonic polyps: Secondary | ICD-10-CM | POA: Diagnosis not present

## 2018-04-09 DIAGNOSIS — Z1211 Encounter for screening for malignant neoplasm of colon: Secondary | ICD-10-CM | POA: Diagnosis not present

## 2018-04-09 DIAGNOSIS — K5730 Diverticulosis of large intestine without perforation or abscess without bleeding: Secondary | ICD-10-CM | POA: Diagnosis not present

## 2018-04-15 DIAGNOSIS — D125 Benign neoplasm of sigmoid colon: Secondary | ICD-10-CM | POA: Diagnosis not present

## 2018-04-15 DIAGNOSIS — Z86010 Personal history of colonic polyps: Secondary | ICD-10-CM | POA: Diagnosis not present

## 2018-04-15 DIAGNOSIS — K5730 Diverticulosis of large intestine without perforation or abscess without bleeding: Secondary | ICD-10-CM | POA: Diagnosis not present

## 2018-04-15 DIAGNOSIS — Z1211 Encounter for screening for malignant neoplasm of colon: Secondary | ICD-10-CM | POA: Diagnosis not present

## 2018-04-15 DIAGNOSIS — K635 Polyp of colon: Secondary | ICD-10-CM | POA: Diagnosis not present

## 2018-05-05 DIAGNOSIS — K641 Second degree hemorrhoids: Secondary | ICD-10-CM | POA: Diagnosis not present

## 2018-08-17 ENCOUNTER — Other Ambulatory Visit: Payer: Self-pay | Admitting: Internal Medicine

## 2018-08-17 DIAGNOSIS — Z1231 Encounter for screening mammogram for malignant neoplasm of breast: Secondary | ICD-10-CM

## 2018-08-31 DIAGNOSIS — Z23 Encounter for immunization: Secondary | ICD-10-CM | POA: Diagnosis not present

## 2018-09-10 ENCOUNTER — Ambulatory Visit (INDEPENDENT_AMBULATORY_CARE_PROVIDER_SITE_OTHER): Payer: Medicare Other | Admitting: Internal Medicine

## 2018-09-10 ENCOUNTER — Ambulatory Visit (INDEPENDENT_AMBULATORY_CARE_PROVIDER_SITE_OTHER): Payer: Medicare Other

## 2018-09-10 ENCOUNTER — Encounter: Payer: Self-pay | Admitting: Internal Medicine

## 2018-09-10 VITALS — BP 142/82 | HR 76 | Temp 97.8°F | Ht 61.5 in | Wt 178.8 lb

## 2018-09-10 DIAGNOSIS — Z0000 Encounter for general adult medical examination without abnormal findings: Secondary | ICD-10-CM | POA: Diagnosis not present

## 2018-09-10 DIAGNOSIS — I10 Essential (primary) hypertension: Secondary | ICD-10-CM

## 2018-09-10 DIAGNOSIS — Z7982 Long term (current) use of aspirin: Secondary | ICD-10-CM | POA: Diagnosis not present

## 2018-09-10 DIAGNOSIS — I119 Hypertensive heart disease without heart failure: Secondary | ICD-10-CM | POA: Insufficient documentation

## 2018-09-10 DIAGNOSIS — Z23 Encounter for immunization: Secondary | ICD-10-CM | POA: Diagnosis not present

## 2018-09-10 DIAGNOSIS — J309 Allergic rhinitis, unspecified: Secondary | ICD-10-CM | POA: Insufficient documentation

## 2018-09-10 DIAGNOSIS — E782 Mixed hyperlipidemia: Secondary | ICD-10-CM | POA: Insufficient documentation

## 2018-09-10 LAB — CMP14 + ANION GAP
ALBUMIN: 4 g/dL (ref 3.6–4.8)
ALT: 22 IU/L (ref 0–32)
AST: 19 IU/L (ref 0–40)
Albumin/Globulin Ratio: 1.4 (ref 1.2–2.2)
Alkaline Phosphatase: 80 IU/L (ref 39–117)
Anion Gap: 17 mmol/L (ref 10.0–18.0)
BILIRUBIN TOTAL: 0.5 mg/dL (ref 0.0–1.2)
BUN / CREAT RATIO: 14 (ref 12–28)
BUN: 14 mg/dL (ref 8–27)
CO2: 26 mmol/L (ref 20–29)
Calcium: 9 mg/dL (ref 8.7–10.3)
Chloride: 103 mmol/L (ref 96–106)
Creatinine, Ser: 1 mg/dL (ref 0.57–1.00)
GFR calc non Af Amer: 58 mL/min/{1.73_m2} — ABNORMAL LOW (ref >59)
GFR, EST AFRICAN AMERICAN: 67 mL/min/{1.73_m2} (ref >59)
GLOBULIN, TOTAL: 2.9 g/dL (ref 1.5–4.5)
Glucose: 100 mg/dL — ABNORMAL HIGH (ref 65–99)
POTASSIUM: 3.5 mmol/L (ref 3.5–5.2)
SODIUM: 146 mmol/L — AB (ref 134–144)
TOTAL PROTEIN: 6.9 g/dL (ref 6.0–8.5)

## 2018-09-10 LAB — CBC
Hematocrit: 41.4 % (ref 34.0–46.6)
Hemoglobin: 13.8 g/dL (ref 11.1–15.9)
MCH: 27.8 pg (ref 26.6–33.0)
MCHC: 33.3 g/dL (ref 31.5–35.7)
MCV: 84 fL (ref 79–97)
PLATELETS: 356 10*3/uL (ref 150–450)
RBC: 4.96 x10E6/uL (ref 3.77–5.28)
RDW: 12.6 % (ref 12.3–15.4)
WBC: 5.5 10*3/uL (ref 3.4–10.8)

## 2018-09-10 LAB — POCT UA - MICROALBUMIN
Albumin/Creatinine Ratio, Urine, POC: 300
Creatinine, POC: 200 mg/dL
Microalbumin Ur, POC: 150 mg/L

## 2018-09-10 LAB — LIPID PANEL
CHOLESTEROL TOTAL: 224 mg/dL — AB (ref 100–199)
Chol/HDL Ratio: 2.8 ratio (ref 0.0–4.4)
HDL: 79 mg/dL (ref >39)
LDL CALC: 128 mg/dL — AB (ref 0–99)
TRIGLYCERIDES: 83 mg/dL (ref 0–149)
VLDL CHOLESTEROL CAL: 17 mg/dL (ref 5–40)

## 2018-09-10 LAB — POCT URINALYSIS DIPSTICK
BILIRUBIN UA: NEGATIVE
Glucose, UA: NEGATIVE
KETONES UA: NEGATIVE
Leukocytes, UA: NEGATIVE
Nitrite, UA: NEGATIVE
PH UA: 7 (ref 5.0–8.0)
Protein, UA: POSITIVE — AB
RBC UA: NEGATIVE
Spec Grav, UA: 1.02 (ref 1.010–1.025)
UROBILINOGEN UA: 0.2 U/dL

## 2018-09-10 NOTE — Progress Notes (Signed)
Subjective:   Kathryn Mendez is a 67 y.o. female who presents for Medicare Annual (Subsequent) preventive examination.  Review of Systems:  n/a Cardiac Risk Factors include: advanced age (>88men, >54 women);hypertension;obesity (BMI >30kg/m2)     Objective:     Vitals: BP (!) 142/82 (BP Location: Left Arm)   Pulse 76   Temp 97.8 F (36.6 C)   Ht 5' 1.5" (1.562 m)   Wt 178 lb 12.8 oz (81.1 kg)   SpO2 95%   BMI 33.24 kg/m   Body mass index is 33.24 kg/m.  Advanced Directives 09/10/2018  Does Patient Have a Medical Advance Directive? No  Would patient like information on creating a medical advance directive? Yes (MAU/Ambulatory/Procedural Areas - Information given)    Tobacco Social History   Tobacco Use  Smoking Status Never Smoker  Smokeless Tobacco Never Used     Counseling given: Not Answered   Clinical Intake:  Pre-visit preparation completed: Yes  Pain : No/denies pain Pain Score: 0-No pain     Nutritional Status: BMI > 30  Obese Nutritional Risks: None Diabetes: No  How often do you need to have someone help you when you read instructions, pamphlets, or other written materials from your doctor or pharmacy?: 1 - Never What is the last grade level you completed in school?: 11th grade  Interpreter Needed?: No  Information entered by :: NAllen LPN  Past Medical History:  Diagnosis Date  . Hypertension    Past Surgical History:  Procedure Laterality Date  . CHOLECYSTECTOMY    . TUBAL LIGATION     Family History  Problem Relation Age of Onset  . Diabetes Mother   . Hypertension Mother   . Diabetes Sister    Social History   Socioeconomic History  . Marital status: Married    Spouse name: Not on file  . Number of children: Not on file  . Years of education: Not on file  . Highest education level: Not on file  Occupational History  . Not on file  Social Needs  . Financial resource strain: Not hard at all  . Food insecurity:    Worry: Never true    Inability: Never true  . Transportation needs:    Medical: No    Non-medical: No  Tobacco Use  . Smoking status: Never Smoker  . Smokeless tobacco: Never Used  Substance and Sexual Activity  . Alcohol use: Not Currently    Comment: occasional  . Drug use: No  . Sexual activity: Not Currently  Lifestyle  . Physical activity:    Days per week: 3 days    Minutes per session: 50 min  . Stress: Not at all  Relationships  . Social connections:    Talks on phone: Not on file    Gets together: Not on file    Attends religious service: Not on file    Active member of club or organization: Not on file    Attends meetings of clubs or organizations: Not on file    Relationship status: Not on file  Other Topics Concern  . Not on file  Social History Narrative  . Not on file    Outpatient Encounter Medications as of 09/10/2018  Medication Sig  . amLODipine (NORVASC) 5 MG tablet Take 5 mg by mouth daily.  Marland Kitchen aspirin 81 MG tablet Take 81 mg by mouth daily.  . cetirizine (ZYRTEC) 10 MG tablet Take 1 tablet (10 mg total) by mouth 2 (two) times daily  as needed (for itching or rash).  . metoprolol succinate (TOPROL-XL) 100 MG 24 hr tablet Take 100 mg by mouth daily. Take with or immediately following a meal.  . rosuvastatin (CRESTOR) 5 MG tablet Take 5 mg by mouth daily.  . valsartan-hydrochlorothiazide (DIOVAN-HCT) 320-25 MG per tablet Take 1 tablet by mouth daily.  . [DISCONTINUED] predniSONE (DELTASONE) 10 MG tablet Take 2 tablets (20 mg total) by mouth daily.  . [DISCONTINUED] ranitidine (ZANTAC) 150 MG capsule Take 1 capsule (150 mg total) by mouth 2 (two) times daily as needed (for itching or rash).   No facility-administered encounter medications on file as of 09/10/2018.     Activities of Daily Living In your present state of health, do you have any difficulty performing the following activities: 09/10/2018  Hearing? N  Vision? N  Difficulty concentrating or  making decisions? N  Walking or climbing stairs? N  Dressing or bathing? N  Doing errands, shopping? N  Preparing Food and eating ? N  Using the Toilet? N  In the past six months, have you accidently leaked urine? N  Do you have problems with loss of bowel control? N  Managing your Medications? N  Managing your Finances? N  Housekeeping or managing your Housekeeping? N  Some recent data might be hidden    Patient Care Team: Glendale Chard, MD as PCP - General (Internal Medicine) Rutherford Guys, MD as Consulting Physician (Ophthalmology)    Assessment:   This is a routine wellness examination for Laneshia.  Exercise Activities and Dietary recommendations Current Exercise Habits: Home exercise routine, Type of exercise: walking, Time (Minutes): 45, Frequency (Times/Week): 3, Weekly Exercise (Minutes/Week): 135, Intensity: Moderate, Exercise limited by: None identified  Goals    . Weight (lb) < 200 lb (90.7 kg) (pt-stated)     Just watching weight       Fall Risk Fall Risk  09/10/2018  Falls in the past year? 0  Risk for fall due to : Medication side effect   Is the patient's home free of loose throw rugs in walkways, pet beds, electrical cords, etc?   yes      Grab bars in the bathroom? no      Handrails on the stairs? n/a      Adequate lighting?   yes  Timed Get Up and Go performed: n/a  Depression Screen PHQ 2/9 Scores 09/10/2018  PHQ - 2 Score 0     Cognitive Function     6CIT Screen 09/10/2018  What Year? 0 points  What month? 0 points  What time? 0 points  Count back from 20 0 points  Months in reverse 4 points  Repeat phrase 2 points  Total Score 6    There is no immunization history for the selected administration types on file for this patient.  Qualifies for Shingles Vaccine? yes  Screening Tests Health Maintenance  Topic Date Due  . Hepatitis C Screening  27-May-1951  . PNA vac Low Risk Adult (1 of 2 - PCV13) 07/05/2016  . MAMMOGRAM   09/05/2019  . TETANUS/TDAP  08/03/2023  . COLONOSCOPY  04/15/2028  . INFLUENZA VACCINE  Completed  . DEXA SCAN  Completed    Cancer Screenings: Lung: Low Dose CT Chest recommended if Age 53-80 years, 30 pack-year currently smoking OR have quit w/in 15years. Patient does not qualify. Breast:  Up to date on Mammogram? Yes   Up to date of Bone Density/Dexa? Yes Colorectal: up to date  Additional Screenings: : Hepatitis  C Screening:      Plan:    Prevnar 13 sent to pharmacy. Patient wants to watch weight.  I have personally reviewed and noted the following in the patient's chart:   . Medical and social history . Use of alcohol, tobacco or illicit drugs  . Current medications and supplements . Functional ability and status . Nutritional status . Physical activity . Advanced directives . List of other physicians . Hospitalizations, surgeries, and ER visits in previous 12 months . Vitals . Screenings to include cognitive, depression, and falls . Referrals and appointments  In addition, I have reviewed and discussed with patient certain preventive protocols, quality metrics, and best practice recommendations. A written personalized care plan for preventive services as well as general preventive health recommendations were provided to patient.     Kellie Simmering, LPN  16/07/9603

## 2018-09-10 NOTE — Patient Instructions (Addendum)
1- Make sure you always take your blood pressure medication 1st thing in the morning, since this is the time heart attacks and strokes occur.  2-  CALL us TOMORROW WITH A BLOOD PRESSURE READING AND AGAIN NEXT WEEK WITH 2 BLOOD PRESSURE READING AFTER YOU TAKE YOUR MEDICATION  3- check your blood pressure at least ones a week 3 hours or after taking your medication

## 2018-09-10 NOTE — Addendum Note (Signed)
Addended by: Luana Shu on: 09/10/2018 12:06 PM   Modules accepted: Orders

## 2018-09-10 NOTE — Patient Instructions (Signed)
Ms. Kathryn Mendez , Thank you for taking time to come for your Medicare Wellness Visit. I appreciate your ongoing commitment to your health goals. Please review the following plan we discussed and let me know if I can assist you in the future.   Screening recommendations/referrals: Colonoscopy: 03/2018 Mammogram: 08/2017 Bone Density: 08/2017 Recommended yearly ophthalmology/optometry visit for glaucoma screening and checkup Recommended yearly dental visit for hygiene and checkup  Vaccinations: Influenza vaccine: 08/2018 Pneumococcal vaccine: prevnar 13 sent to pharmacy Tdap vaccine: 07/2013 Shingles vaccine: dec    Advanced directives: Advance directive discussed with you today. I have provided a copy for you to complete at home and have notarized. Once this is complete please bring a copy in to our office so we can scan it into your chart.   Conditions/risks identified: Obesity: walks regularly. Goal to watch weight Next appointment: 12/14/2018 at [facility] 67 Years and Older, Female Preventive care refers to lifestyle choices and visits with your health care provider that can promote health and wellness. What does preventive care include?  A yearly physical exam. This is also called an annual well check.  Dental exams once or twice a year.  Routine eye exams. Ask your health care provider how often you should have your eyes checked.  Personal lifestyle choices, including:  Daily care of your teeth and gums.  Regular physical activity.  Eating a healthy diet.  Avoiding tobacco and drug use.  Limiting alcohol use.  Practicing safe sex.  Taking low-dose aspirin every day.  Taking vitamin and mineral supplements as recommended by your health care provider. What happens during an annual well check? The services and screenings done by your health care provider during your annual well check will depend on your age, overall health, lifestyle risk factors, and  family history of disease. Counseling  Your health care provider may ask you questions about your:  Alcohol use.  Tobacco use.  Drug use.  Emotional well-being.  Home and relationship well-being.  Sexual activity.  Eating habits.  History of falls.  Memory and ability to understand (cognition).  Work and work Statistician.  Reproductive health. Screening  You may have the following tests or measurements:  Height, weight, and BMI.  Blood pressure.  Lipid and cholesterol levels. These may be checked every 5 years, or more frequently if you are over 67 years old.  Skin check.  Lung cancer screening. You may have this screening every year starting at age 67 if you have a 30-pack-year history of smoking and currently smoke or have quit within the past 15 years.  Fecal occult blood test (FOBT) of the stool. You may have this test every year starting at age 67.  Flexible sigmoidoscopy or colonoscopy. You may have a sigmoidoscopy every 5 years or a colonoscopy every 10 years starting at age 67.  Hepatitis C blood test.  Hepatitis B blood test.  Sexually transmitted disease (STD) testing.  Diabetes screening. This is done by checking your blood sugar (glucose) after you have not eaten for a while (fasting). You may have this done every 1-3 years.  Bone density scan. This is done to screen for osteoporosis. You may have this done starting at age 67.  Mammogram. This may be done every 1-2 years. Talk to your health care provider about how often you should have regular mammograms. Talk with your health care provider about your test results, treatment options, and if necessary, the need for more tests. Vaccines  Your health  care provider may recommend certain vaccines, such as:  Influenza vaccine. This is recommended every year.  Tetanus, diphtheria, and acellular pertussis (Tdap, Td) vaccine. You may need a Td booster every 10 years.  Zoster vaccine. You may need this  after age 67.  Pneumococcal 13-valent conjugate (PCV13) vaccine. One dose is recommended after age 67.  Pneumococcal polysaccharide (PPSV23) vaccine. One dose is recommended after age 67. Talk to your health care provider about which screenings and vaccines you need and how often you need them. This information is not intended to replace advice given to you by your health care provider. Make sure you discuss any questions you have with your health care provider. Document Released: 11/10/2015 Document Revised: 07/03/2016 Document Reviewed: 08/15/2015 Elsevier Interactive Patient Education  2017 [place] Prevention in the Home Falls can cause injuries. They can happen to people of all ages. There are many things you can do to make your home safe and to help prevent falls. What can I do on the outside of my home?  Regularly fix the edges of walkways and driveways and fix any cracks.  Remove anything that might make you trip as you walk through a door, such as a raised step or threshold.  Trim any bushes or trees on the path to your home.  Use bright outdoor lighting.  Clear any walking paths of anything that might make someone trip, such as rocks or tools.  Regularly check to see if handrails are loose or broken. Make sure that both sides of any steps have handrails.  Any raised decks and porches should have guardrails on the edges.  Have any leaves, snow, or ice cleared regularly.  Use sand or salt on walking paths during winter.  Clean up any spills in your garage right away. This includes oil or grease spills. What can I do in the bathroom?  Use night lights.  Install grab bars by the toilet and in the tub and shower. Do not use towel bars as grab bars.  Use non-skid mats or decals in the tub or shower.  If you need to sit down in the shower, use a plastic, non-slip stool.  Keep the floor dry. Clean up any water that spills on the floor as soon as it  happens.  Remove soap buildup in the tub or shower regularly.  Attach bath mats securely with double-sided non-slip rug tape.  Do not have throw rugs and other things on the floor that can make you trip. What can I do in the bedroom?  Use night lights.  Make sure that you have a light by your bed that is easy to reach.  Do not use any sheets or blankets that are too big for your bed. They should not hang down onto the floor.  Have a firm chair that has side arms. You can use this for support while you get dressed.  Do not have throw rugs and other things on the floor that can make you trip. What can I do in the kitchen?  Clean up any spills right away.  Avoid walking on wet floors.  Keep items that you use a lot in easy-to-reach places.  If you need to reach something above you, use a strong step stool that has a grab bar.  Keep electrical cords out of the way.  Do not use floor polish or wax that makes floors slippery. If you must use wax, use non-skid floor wax.  Do not have  throw rugs and other things on the floor that can make you trip. What can I do with my stairs?  Do not leave any items on the stairs.  Make sure that there are handrails on both sides of the stairs and use them. Fix handrails that are broken or loose. Make sure that handrails are as long as the stairways.  Check any carpeting to make sure that it is firmly attached to the stairs. Fix any carpet that is loose or worn.  Avoid having throw rugs at the top or bottom of the stairs. If you do have throw rugs, attach them to the floor with carpet tape.  Make sure that you have a light switch at the top of the stairs and the bottom of the stairs. If you do not have them, ask someone to add them for you. What else can I do to help prevent falls?  Wear shoes that:  Do not have high heels.  Have rubber bottoms.  Are comfortable and fit you well.  Are closed at the toe. Do not wear sandals.  If you  use a stepladder:  Make sure that it is fully opened. Do not climb a closed stepladder.  Make sure that both sides of the stepladder are locked into place.  Ask someone to hold it for you, if possible.  Clearly mark and make sure that you can see:  Any grab bars or handrails.  First and last steps.  Where the edge of each step is.  Use tools that help you move around (mobility aids) if they are needed. These include:  Canes.  Walkers.  Scooters.  Crutches.  Turn on the lights when you go into a dark area. Replace any light bulbs as soon as they burn out.  Set up your furniture so you have a clear path. Avoid moving your furniture around.  If any of your floors are uneven, fix them.  If there are any pets around you, be aware of where they are.  Review your medicines with your doctor. Some medicines can make you feel dizzy. This can increase your chance of falling. Ask your doctor what other things that you can do to help prevent falls. This information is not intended to replace advice given to you by your health care provider. Make sure you discuss any questions you have with your health care provider. Document Released: 08/10/2009 Document Revised: 03/21/2016 Document Reviewed: 11/18/2014 Elsevier Interactive Patient Education  2017 [employer].

## 2018-09-10 NOTE — Progress Notes (Signed)
Subjective:     Patient ID: Kathryn Mendez , female    DOB: August 04, 1951 , 67 y.o.   MRN: [id number]   CC- HTN check  HPI  BP's ran around 120's/ 80's. She did not take her blood pressure medicine this am  Past Medical History:  Diagnosis Date  . Hypertension      Family History  Problem Relation Age of Onset  . Diabetes Mother   . Hypertension Mother   . Diabetes Sister      Current Outpatient Medications:  .  amLODipine (NORVASC) 5 MG tablet, Take 5 mg by mouth daily., Disp: , Rfl:  .  aspirin 81 MG tablet, Take 81 mg by mouth daily., Disp: , Rfl:  .  cetirizine (ZYRTEC) 10 MG tablet, Take 1 tablet (10 mg total) by mouth 2 (two) times daily as needed (for itching or rash)., Disp: 30 tablet, Rfl: 0 .  metoprolol succinate (TOPROL-XL) 100 MG 24 hr tablet, Take 100 mg by mouth daily. Take with or immediately following a meal., Disp: , Rfl:  .  predniSONE (DELTASONE) 10 MG tablet, Take 2 tablets (20 mg total) by mouth daily. (Patient not taking: Reported on 09/10/2018), Disp: 12 tablet, Rfl: 0 .  ranitidine (ZANTAC) 150 MG capsule, Take 1 capsule (150 mg total) by mouth 2 (two) times daily as needed (for itching or rash). (Patient not taking: Reported on 09/10/2018), Disp: 30 capsule, Rfl: 0 .  rosuvastatin (CRESTOR) 5 MG tablet, Take 5 mg by mouth daily., Disp: , Rfl:  .  valsartan-hydrochlorothiazide (DIOVAN-HCT) 320-25 MG per tablet, Take 1 tablet by mouth daily., Disp: , Rfl:    Allergies  Allergen Reactions  . Other     Food allergies     Review of Systems  Constitutional: Negative for diaphoresis.  HENT: Negative for nosebleeds and tinnitus.   Respiratory: Negative for chest tightness and shortness of breath.   Cardiovascular: Negative for chest pain, palpitations and leg swelling.  Gastrointestinal: Negative for constipation and diarrhea.  Endocrine: Negative for polydipsia and polyphagia.  Genitourinary: Negative for dysuria, frequency and urgency.   Neurological: Negative for facial asymmetry, weakness and headaches.     Today's Vitals   09/10/18 0912  BP: (!) 142/82  Pulse: 76  Temp: 97.8 F (36.6 C)  TempSrc: Oral  SpO2: 95%  Weight: 178 lb 12.8 oz (81.1 kg)  Height: 5' 1.5" (1.562 m)   Body mass index is 33.24 kg/m.   Objective:  Physical Exam  Constitutional: She is oriented to person, place, and time. She appears well-developed and well-nourished. No distress.  HENT:  Head: Normocephalic and atraumatic.  Right Ear: External ear normal.  Left Ear: External ear normal.  Nose: Nose normal.  Eyes: Conjunctivae are normal. Right eye exhibits no discharge. Left eye exhibits no discharge. No scleral icterus.  Neck: Neck supple. No thyromegaly present.  No carotid bruits bilaterally  Cardiovascular: Normal rate and regular rhythm.  No murmur heard. Pulmonary/Chest: Effort normal and breath sounds normal. No respiratory distress.  Musculoskeletal: Normal range of motion. She exhibits no edema.  Lymphadenopathy:    She has no cervical adenopathy.  Neurological: She is alert and oriented to person, place, and time.  Skin: Skin is warm and dry. Capillary refill takes less than 2 seconds. No rash noted. She is not diaphoretic.  Psychiatric: She has a normal mood and affect. Her behavior is normal. Judgment and thought content normal.  Nursing note reviewed.   Assessment And Plan:  1. Essential  hypertension-  Not well controlled right now. She was asked to call us with 3 BP readings starting tomorrow. - Lipid Profile - CMP14 + Anion Gap - CBC no Diff  FU in 3 months. May stay on current dose for now.  Roshawn Ayala RODRIGUEZ-SOUTHWORTH, PA-C

## 2018-09-14 ENCOUNTER — Other Ambulatory Visit: Payer: Self-pay

## 2018-09-15 ENCOUNTER — Encounter: Payer: Self-pay | Admitting: Internal Medicine

## 2018-09-23 ENCOUNTER — Ambulatory Visit: Payer: Medicare Other

## 2018-09-28 ENCOUNTER — Other Ambulatory Visit: Payer: Self-pay | Admitting: Internal Medicine

## 2018-10-27 ENCOUNTER — Other Ambulatory Visit: Payer: Self-pay | Admitting: Internal Medicine

## 2018-11-04 ENCOUNTER — Ambulatory Visit (INDEPENDENT_AMBULATORY_CARE_PROVIDER_SITE_OTHER): Payer: Medicare Other | Admitting: Nurse Practitioner

## 2018-11-04 ENCOUNTER — Encounter: Payer: Self-pay | Admitting: Nurse Practitioner

## 2018-11-04 VITALS — BP 132/70 | HR 65 | Temp 97.4°F | Ht 62.2 in | Wt 178.0 lb

## 2018-11-04 DIAGNOSIS — M545 Low back pain, unspecified: Secondary | ICD-10-CM

## 2018-11-04 DIAGNOSIS — S39012A Strain of muscle, fascia and tendon of lower back, initial encounter: Secondary | ICD-10-CM | POA: Diagnosis not present

## 2018-11-04 DIAGNOSIS — T148XXA Other injury of unspecified body region, initial encounter: Secondary | ICD-10-CM

## 2018-11-04 LAB — POCT URINALYSIS DIPSTICK
BILIRUBIN UA: NEGATIVE
GLUCOSE UA: NEGATIVE
KETONES UA: NEGATIVE
Leukocytes, UA: NEGATIVE
Nitrite, UA: NEGATIVE
Protein, UA: POSITIVE — AB
SPEC GRAV UA: 1.015 (ref 1.010–1.025)
Urobilinogen, UA: NEGATIVE E.U./dL — AB
pH, UA: 7 (ref 5.0–8.0)

## 2018-11-04 MED ORDER — CYCLOBENZAPRINE HCL 10 MG PO TABS: 10.0000 mg | ORAL_TABLET | Freq: Three times a day (TID) | ORAL | 0 refills | Status: DC | PRN

## 2018-11-04 MED ORDER — TRIAMCINOLONE ACETONIDE 40 MG/ML IJ SUSP
40.0000 mg | Freq: Once | INTRAMUSCULAR | Status: AC
  Administered 2018-11-04: 40 mg via INTRAMUSCULAR

## 2018-11-04 MED ORDER — KETOROLAC TROMETHAMINE 60 MG/2ML IM SOLN
60.0000 mg | Freq: Once | INTRAMUSCULAR | Status: AC
  Administered 2018-11-04: 60 mg via INTRAMUSCULAR

## 2018-11-04 NOTE — Progress Notes (Signed)
Subjective:     Patient ID: Kathryn Mendez , female    DOB: [birth year] , 68 y.o.   MRN: [id number]   Chief Complaint  Patient presents with  . Back Pain    HPI  Back Pain  The current episode started in the past 7 days. The problem occurs constantly. The problem has been gradually worsening since onset. The pain is present in the lumbar spine. Quality: "just hurting pain" The pain is the same all the time. The symptoms are aggravated by lying down, sitting and twisting. Pertinent negatives include no fever, headaches, paresthesias or tingling. Risk factors include sedentary lifestyle. She has tried nothing for the symptoms.     Past Medical History:  Diagnosis Date  . Hypertension      Family History  Problem Relation Age of Onset  . Diabetes Mother   . Hypertension Mother   . Diabetes Sister      Current Outpatient Medications:  .  amLODipine (NORVASC) 5 MG tablet, TAKE 1 TABLET BY MOUTH DAILY, Disp: 90 tablet, Rfl: 0 .  aspirin 81 MG tablet, Take 81 mg by mouth daily., Disp: , Rfl:  .  cetirizine (ZYRTEC) 10 MG tablet, Take 1 tablet (10 mg total) by mouth 2 (two) times daily as needed (for itching or rash)., Disp: 30 tablet, Rfl: 0 .  hydrochlorothiazide (HYDRODIURIL) 25 MG tablet, TAKE 1 TABLET BY MOUTH EVERY DAY, Disp: 90 tablet, Rfl: 0 .  metoprolol succinate (TOPROL-XL) 100 MG 24 hr tablet, Take 100 mg by mouth daily. Take with or immediately following a meal., Disp: , Rfl:  .  rosuvastatin (CRESTOR) 5 MG tablet, Take 5 mg by mouth daily., Disp: , Rfl:    Allergies  Allergen Reactions  . Other     Food allergies     Review of Systems  Constitutional: Negative for fever.  Musculoskeletal: Positive for back pain. Negative for arthralgias and myalgias.  Neurological: Negative for tingling, headaches and paresthesias.      Today's Vitals   11/04/18 1210  BP: 132/70  Pulse: 65  Temp: (!) 97.4 F (36.3 C)  TempSrc: Oral  SpO2: 98%  Weight: 178 lb (80.7  kg)  Height: 5' 2.2" (1.58 m)  PainSc: 10-Worst pain ever  PainLoc: Back   Body mass index is 32.35 kg/m.   Objective:  Physical Exam Constitutional:      Appearance: Normal appearance.  Cardiovascular:     Rate and Rhythm: Normal rate and regular rhythm.     Pulses: Normal pulses.     Heart sounds: Normal heart sounds.  Pulmonary:     Effort: Pulmonary effort is normal.     Breath sounds: Normal breath sounds.  Musculoskeletal:        General: Tenderness (left upper and lower back with slight tension) present.  Neurological:     General: No focal deficit present.     Mental Status: She is alert and oriented to person, place, and time.         Assessment And Plan:     1. Muscle strain  Tenderness to left lateral back with tension to shoulder areas  Encouraged to stretch regularly and provided limited amount of muscle relaxers.  - ketorolac (TORADOL) injection 60 mg - triamcinolone acetonide (KENALOG-40) injection 40 mg - cyclobenzaprine (FLEXERIL) 10 MG tablet; Take 1 tablet (10 mg total) by mouth 3 (three) times daily as needed for muscle spasms.  Dispense: 30 tablet; Refill: 0    Minette Brine, FNP

## 2018-11-04 NOTE — Patient Instructions (Signed)
Muscle Strain A muscle strain is an injury that happens when a muscle is stretched longer than normal. This can happen during a fall, sports, or lifting. This can tear some muscle fibers. Usually, recovery from muscle strain takes 1-2 weeks. Complete healing normally takes 5-6 weeks. This condition is first treated with PRICE therapy. This involves:  Protecting your muscle from being injured again.  Resting your injured muscle.  Icing your injured muscle.  Applying pressure (compression) to your injured muscle. This may be done with a splint or elastic bandage.  Raising (elevating) your injured muscle. Your doctor may also recommend medicine for pain. Follow these instructions at home: If you have a splint:  Wear the splint as told by your doctor. Take it off only as told by your doctor.  Loosen the splint if your fingers or toes tingle, get numb, or turn cold and blue.  Keep the splint clean.  If the splint is not waterproof: ? Do not let it get wet. ? Cover it with a watertight covering when you take a bath or a shower. Managing pain, stiffness, and swelling   If directed, put ice on your injured area. ? If you have a removable splint, take it off as told by your doctor. ? Put ice in a plastic bag. ? Place a towel between your skin and the bag. ? Leave the ice on for 20 minutes, 2-3 times a day.  Move your fingers or toes often. This helps to avoid stiffness and lessen swelling.  Raise your injured area above the level of your heart while you are sitting or lying down.  Wear an elastic bandage as told by your doctor. Make sure it is not too tight. General instructions  Take over-the-counter and prescription medicines only as told by your doctor.  Limit your activity. Rest your injured muscle as told by your doctor. Your doctor may say that gentle movements are okay.  If physical therapy was prescribed, do exercises as told by your doctor.  Do not put pressure on any  part of the splint until it is fully hardened. This may take many hours.  Do not use any products that contain nicotine or tobacco, such as cigarettes and e-cigarettes. These can delay bone healing. If you need help quitting, ask your doctor.  Warm up before you exercise. This helps to prevent more muscle strains.  Ask your doctor when it is safe to drive if you have a splint.  Keep all follow-up visits as told by your doctor. This is important. Contact a doctor if:  You have more pain or swelling in your injured area. Get help right away if:  You have any of these problems in your injured area: ? You have numbness. ? You have tingling. ? You lose a lot of strength. Summary  A muscle strain is an injury that happens when a muscle is stretched longer than normal.  This condition is first treated with PRICE therapy. This includes protecting, resting, icing, adding pressure, and raising your injury.  Limit your activity. Rest your injured muscle as told by your doctor. Your doctor may say that gentle movements are okay.  Warm up before you exercise. This helps to prevent more muscle strains. This information is not intended to replace advice given to you by your health care provider. Make sure you discuss any questions you have with your health care provider. Document Released: 07/23/2008 Document Revised: 11/20/2016 Document Reviewed: 11/20/2016 Elsevier Interactive Patient Education  2019 Elsevier   Inc.  

## 2018-11-04 NOTE — Addendum Note (Signed)
Addended by: Luana Shu on: 11/04/2018 04:50 PM   Modules accepted: Orders

## 2018-11-06 ENCOUNTER — Ambulatory Visit
Admission: RE | Admit: 2018-11-06 | Discharge: 2018-11-06 | Disposition: A | Discharge status: Home / Self Care | Payer: Medicare Other | Source: Ambulatory Visit | Attending: Internal Medicine | Admitting: Internal Medicine

## 2018-11-06 DIAGNOSIS — Z1231 Encounter for screening mammogram for malignant neoplasm of breast: Secondary | ICD-10-CM | POA: Diagnosis not present

## 2018-11-10 ENCOUNTER — Telehealth: Payer: Self-pay

## 2018-11-10 DIAGNOSIS — H2513 Age-related nuclear cataract, bilateral: Secondary | ICD-10-CM | POA: Diagnosis not present

## 2018-11-10 DIAGNOSIS — H524 Presbyopia: Secondary | ICD-10-CM | POA: Diagnosis not present

## 2018-11-10 NOTE — Telephone Encounter (Signed)
Patient called stating her back pain as not improved and she wants to know of she should come back in or what do you suggest? Please advise   Returned pt call and advised her we can refer her to physical therapy. Pt stated she has started the muscle relaxer and the pan has improved some so she would like to give it a couple ore days. YRL,RMA

## 2018-11-13 NOTE — Telephone Encounter (Signed)
Okay noted

## 2018-11-24 ENCOUNTER — Encounter: Payer: Self-pay | Admitting: Internal Medicine

## 2018-11-24 ENCOUNTER — Ambulatory Visit (INDEPENDENT_AMBULATORY_CARE_PROVIDER_SITE_OTHER): Payer: Medicare Other | Admitting: Internal Medicine

## 2018-11-24 VITALS — BP 112/74 | HR 72 | Temp 97.6°F | Ht 61.8 in | Wt 176.2 lb

## 2018-11-24 DIAGNOSIS — K640 First degree hemorrhoids: Secondary | ICD-10-CM

## 2018-11-24 DIAGNOSIS — R195 Other fecal abnormalities: Secondary | ICD-10-CM | POA: Diagnosis not present

## 2018-11-24 DIAGNOSIS — K921 Melena: Secondary | ICD-10-CM | POA: Diagnosis not present

## 2018-11-24 DIAGNOSIS — R109 Unspecified abdominal pain: Secondary | ICD-10-CM | POA: Diagnosis not present

## 2018-11-24 DIAGNOSIS — Z1211 Encounter for screening for malignant neoplasm of colon: Secondary | ICD-10-CM

## 2018-11-24 LAB — POCT URINALYSIS DIPSTICK
Bilirubin, UA: NEGATIVE
Blood, UA: NEGATIVE
GLUCOSE UA: NEGATIVE
KETONES UA: NEGATIVE
Leukocytes, UA: NEGATIVE
NITRITE UA: NEGATIVE
PROTEIN UA: NEGATIVE
Spec Grav, UA: 1.02 (ref 1.010–1.025)
Urobilinogen, UA: 0.2 E.U./dL
pH, UA: 6.5 (ref 5.0–8.0)

## 2018-11-24 LAB — CBC
HEMOGLOBIN: 14 g/dL (ref 11.1–15.9)
Hematocrit: 42.6 % (ref 34.0–46.6)
MCH: 27.6 pg (ref 26.6–33.0)
MCHC: 32.9 g/dL (ref 31.5–35.7)
MCV: 84 fL (ref 79–97)
PLATELETS: 386 10*3/uL (ref 150–450)
RBC: 5.07 x10E6/uL (ref 3.77–5.28)
RDW: 12.6 % (ref 11.7–15.4)
WBC: 6.8 10*3/uL (ref 3.4–10.8)

## 2018-11-24 LAB — POC HEMOCCULT BLD/STL (OFFICE/1-CARD/DIAGNOSTIC)
FECAL OCCULT BLD: POSITIVE — AB
OCCULT BLOOD DATE: 3302020

## 2018-11-24 MED ORDER — HYDROCORTISONE 2.5 % RE CREA: TOPICAL_CREAM | RECTAL | 1 refills | Status: AC

## 2018-11-24 NOTE — Progress Notes (Signed)
Subjective:     Patient ID: Kathryn Mendez , female    DOB: 03/17/1951 , 68 y.o.   MRN: [id number]   Chief Complaint  Patient presents with  . Blood In Stools    HPI  She is here today for further evaluation of blood in her stools.  She first noticed this over the weekend and then again today. She denies recent constipation and/or diarrhea. She reports having bowel movement on Saturday, she noticed blood both on the toilet paper and in the toilet. There was no associated abdominal discomfort/cramping. She admits to eating a salad with nuts over the weekend. She is not sure if this contributed to her symptoms.     Past Medical History:  Diagnosis Date  . Hypertension      Family History  Problem Relation Age of Onset  . Diabetes Mother   . Hypertension Mother   . Diabetes Sister      Current Outpatient Medications:  .  amLODipine (NORVASC) 5 MG tablet, TAKE 1 TABLET BY MOUTH DAILY, Disp: 90 tablet, Rfl: 0 .  aspirin 81 MG tablet, Take 81 mg by mouth daily., Disp: , Rfl:  .  cyclobenzaprine (FLEXERIL) 10 MG tablet, Take 1 tablet (10 mg total) by mouth 3 (three) times daily as needed for muscle spasms., Disp: 30 tablet, Rfl: 0 .  hydrochlorothiazide (HYDRODIURIL) 25 MG tablet, TAKE 1 TABLET BY MOUTH EVERY DAY, Disp: 90 tablet, Rfl: 0 .  metoprolol succinate (TOPROL-XL) 100 MG 24 hr tablet, Take 100 mg by mouth daily. Take with or immediately following a meal., Disp: , Rfl:  .  rosuvastatin (CRESTOR) 5 MG tablet, Take 5 mg by mouth daily., Disp: , Rfl:  .  hydrocortisone (ANUSOL-HC) 2.5 % rectal cream, Apply rectally 2 times daily, Disp: 30 g, Rfl: 1   Allergies  Allergen Reactions  . Other     Food allergies     Review of Systems  Constitutional: Negative.   Respiratory: Negative.   Cardiovascular: Negative.   Gastrointestinal: Positive for blood in stool.  Neurological: Negative.   Psychiatric/Behavioral: Negative.      Today's Vitals   11/24/18 1456  BP:  112/74  Pulse: 72  Temp: 97.6 F (36.4 C)  TempSrc: Oral  Weight: 176 lb 3.2 oz (79.9 kg)  Height: 5' 1.8" (1.57 m)   Body mass index is 32.44 kg/m.   Objective:  Physical Exam Vitals signs and nursing note reviewed.  Constitutional:      Appearance: Normal appearance.  HENT:     Head: Normocephalic and atraumatic.  Cardiovascular:     Rate and Rhythm: Normal rate and regular rhythm.     Heart sounds: Normal heart sounds.  Pulmonary:     Effort: Pulmonary effort is normal.     Breath sounds: Normal breath sounds.  Genitourinary:    Rectum: Guaiac result positive.     Comments: hemorrhoids Skin:    General: Skin is warm.  Neurological:     General: No focal deficit present.     Mental Status: She is alert.         Assessment And Plan:     1. Hematochezia  I will check CBC today. Her sx are suggestive of diverticular disease. There was diverticulosis, along with small polyp noted on her June 2019 colonoscopy. Her results were reviewed in full detail during her visit.  I will check labs as listed below. I do also plan to refer her to GI for further evluation.   -  CBC no Diff - POC Hemoccult Bld/Stl (1-Cd Office Dx)  2. Grade I hemorrhoids  Rx anusol was sent to the pharmacy. She is encouraged to use as directed.   3. Right flank pain  I will check urinalysis today. If hematuria is present, I will schedule her for renal u/s.  4. Encounter for Hemoccult screening  Stool is guaiac positive.  [name], MD

## 2018-11-26 NOTE — Addendum Note (Signed)
Addended by: Michelle Nasuti on: 11/26/2018 07:58 AM   Modules accepted: Orders

## 2018-12-09 ENCOUNTER — Telehealth: Payer: Self-pay

## 2018-12-09 NOTE — Telephone Encounter (Signed)
Left the pt a message to call back for her lab results. 

## 2018-12-09 NOTE — Telephone Encounter (Signed)
-----   Message from Glendale Chard, MD sent at 12/03/2018  8:07 AM EST ----- Your blood count is normal. How are you feeling? Your urine was normal as well.

## 2018-12-14 ENCOUNTER — Other Ambulatory Visit: Payer: Self-pay

## 2018-12-14 ENCOUNTER — Ambulatory Visit (INDEPENDENT_AMBULATORY_CARE_PROVIDER_SITE_OTHER): Payer: Medicare Other | Admitting: Internal Medicine

## 2018-12-14 ENCOUNTER — Encounter: Payer: Self-pay | Admitting: Internal Medicine

## 2018-12-14 VITALS — BP 136/84 | HR 71 | Temp 97.7°F | Ht 61.8 in | Wt 177.6 lb

## 2018-12-14 DIAGNOSIS — R0683 Snoring: Secondary | ICD-10-CM | POA: Insufficient documentation

## 2018-12-14 DIAGNOSIS — N182 Chronic kidney disease, stage 2 (mild): Secondary | ICD-10-CM

## 2018-12-14 DIAGNOSIS — Z6832 Body mass index (BMI) 32.0-32.9, adult: Secondary | ICD-10-CM

## 2018-12-14 DIAGNOSIS — E6609 Other obesity due to excess calories: Secondary | ICD-10-CM | POA: Diagnosis not present

## 2018-12-14 DIAGNOSIS — R5383 Other fatigue: Secondary | ICD-10-CM | POA: Diagnosis not present

## 2018-12-14 DIAGNOSIS — I129 Hypertensive chronic kidney disease with stage 1 through stage 4 chronic kidney disease, or unspecified chronic kidney disease: Secondary | ICD-10-CM | POA: Diagnosis not present

## 2018-12-14 MED ORDER — ROSUVASTATIN CALCIUM 5 MG PO TABS: 5.0000 mg | ORAL_TABLET | Freq: Every day | ORAL | 1 refills | Status: DC

## 2018-12-14 NOTE — Patient Instructions (Signed)

## 2018-12-14 NOTE — Progress Notes (Signed)
Subjective:     Patient ID: Kathryn Mendez , female    DOB: 12-10-1950 , 68 y.o.   MRN: [id number]   Chief Complaint  Patient presents with  . Hypertension    HPI  Hypertension  This is a chronic problem. The current episode started more than 1 year ago. The problem has been gradually improving since onset. The problem is controlled. Associated symptoms include malaise/fatigue. Hypertensive end-organ damage includes kidney disease.     Past Medical History:  Diagnosis Date  . Hypertension      Family History  Problem Relation Age of Onset  . Diabetes Mother   . Hypertension Mother   . Diabetes Sister      Current Outpatient Medications:  .  amLODipine (NORVASC) 5 MG tablet, TAKE 1 TABLET BY MOUTH DAILY, Disp: 90 tablet, Rfl: 0 .  aspirin 81 MG tablet, Take 81 mg by mouth daily., Disp: , Rfl:  .  cyclobenzaprine (FLEXERIL) 10 MG tablet, Take 1 tablet (10 mg total) by mouth 3 (three) times daily as needed for muscle spasms., Disp: 30 tablet, Rfl: 0 .  hydrochlorothiazide (HYDRODIURIL) 25 MG tablet, TAKE 1 TABLET BY MOUTH EVERY DAY, Disp: 90 tablet, Rfl: 0 .  hydrocortisone (ANUSOL-HC) 2.5 % rectal cream, Apply rectally 2 times daily, Disp: 30 g, Rfl: 1 .  metoprolol succinate (TOPROL-XL) 100 MG 24 hr tablet, Take 100 mg by mouth daily. Take with or immediately following a meal., Disp: , Rfl:  .  rosuvastatin (CRESTOR) 5 MG tablet, Take 1 tablet (5 mg total) by mouth daily., Disp: 90 tablet, Rfl: 1   Allergies  Allergen Reactions  . Other     Food allergies       Review of Systems  Constitutional: Positive for malaise/fatigue.  Respiratory: Negative.   Cardiovascular: Negative.   Gastrointestinal: Negative.   Neurological: Negative.   Psychiatric/Behavioral: Negative.      Today's Vitals   12/14/18 0905  BP: 136/84  Pulse: 71  Temp: 97.7 F (36.5 C)  TempSrc: Oral  Weight: 177 lb 9.6 oz (80.6 kg)  Height: 5' 1.8" (1.57 m)  PainSc: 0-No pain   Body  mass index is 32.69 kg/m.   Objective:  Physical Exam Vitals signs and nursing note reviewed.  Constitutional:      Appearance: Normal appearance.  HENT:     Head: Normocephalic and atraumatic.  Cardiovascular:     Rate and Rhythm: Normal rate and regular rhythm.     Heart sounds: Normal heart sounds.  Pulmonary:     Effort: Pulmonary effort is normal.     Breath sounds: Normal breath sounds.  Skin:    General: Skin is warm.  Neurological:     General: No focal deficit present.     Mental Status: She is alert.  Psychiatric:        Mood and Affect: Mood normal.        Behavior: Behavior normal.         Assessment And Plan:     1. Hypertensive nephropathy  Controlled. She will continue with current meds for now. She verbalizes understanding that optimal bp is less than 130/80.  She is encouraged to incorporate more exercise into her daily routine.   2. Chronic renal disease, stage II  Chronic. I will check bmet at her next visit.   3. Snoring  I will refer her for sleep study.   - Ambulatory referral to Neurology  4. Fatigue, unspecified type  Possibly related to  OSA. She is encouraged to stay well hydrated. She is agreeable with pursuing sleep study.   5. Class 1 obesity due to excess calories with serious comorbidity and body mass index (BMI) of 32.0 to 32.9 in adult  Importance of achieving optimal weight to decrease risk of cardiovascular disease and cancers was discussed with the patient in full detail. She is encouraged to start slowly - start with 10 minutes twice daily at least three to four days per week and to gradually build to 30 minutes five days weekly. She was given tips to incorporate more activity into her daily routine - take stairs when possible, park farther away from her job, grocery stores, etc.      [name], MD

## 2018-12-31 ENCOUNTER — Other Ambulatory Visit: Payer: Self-pay | Admitting: Internal Medicine

## 2019-01-19 ENCOUNTER — Institutional Professional Consult (permissible substitution): Payer: Medicare Other | Admitting: Neurology

## 2019-02-02 ENCOUNTER — Telehealth: Payer: Self-pay | Admitting: Neurology

## 2019-02-02 NOTE — Telephone Encounter (Signed)
Due to current COVID 19 pandemic, our office is severely reducing in office visits for at least the next 2 weeks, in order to minimize the risk to our patients and healthcare providers. Our staff will contact you for next steps.I called the pt to switch her appt to a virtual visit. Pt declined and stated she would rather push her appt out.

## 2019-02-03 ENCOUNTER — Other Ambulatory Visit: Payer: Self-pay

## 2019-02-03 MED ORDER — AMLODIPINE BESYLATE 5 MG PO TABS: 5.0000 mg | ORAL_TABLET | Freq: Every day | ORAL | 0 refills | Status: DC

## 2019-02-04 ENCOUNTER — Other Ambulatory Visit: Payer: Self-pay | Admitting: Internal Medicine

## 2019-03-01 ENCOUNTER — Institutional Professional Consult (permissible substitution): Payer: Medicare Other | Admitting: Neurology

## 2019-03-15 ENCOUNTER — Ambulatory Visit (INDEPENDENT_AMBULATORY_CARE_PROVIDER_SITE_OTHER): Payer: Medicare Other | Admitting: Internal Medicine

## 2019-03-15 ENCOUNTER — Other Ambulatory Visit: Payer: Self-pay

## 2019-03-15 ENCOUNTER — Encounter: Payer: Self-pay | Admitting: Internal Medicine

## 2019-03-15 VITALS — BP 126/84 | HR 74 | Temp 97.6°F | Ht 61.8 in | Wt 171.4 lb

## 2019-03-15 DIAGNOSIS — E6609 Other obesity due to excess calories: Secondary | ICD-10-CM

## 2019-03-15 DIAGNOSIS — N182 Chronic kidney disease, stage 2 (mild): Secondary | ICD-10-CM | POA: Diagnosis not present

## 2019-03-15 DIAGNOSIS — R7309 Other abnormal glucose: Secondary | ICD-10-CM

## 2019-03-15 DIAGNOSIS — E7800 Pure hypercholesterolemia, unspecified: Secondary | ICD-10-CM | POA: Diagnosis not present

## 2019-03-15 DIAGNOSIS — I129 Hypertensive chronic kidney disease with stage 1 through stage 4 chronic kidney disease, or unspecified chronic kidney disease: Secondary | ICD-10-CM | POA: Diagnosis not present

## 2019-03-15 DIAGNOSIS — Z6831 Body mass index (BMI) 31.0-31.9, adult: Secondary | ICD-10-CM

## 2019-03-15 NOTE — Progress Notes (Signed)
Subjective:     Patient ID: Kathryn Mendez , female    DOB: Mar 07, 1951 , 68 y.o.   MRN: [id number]   Chief Complaint  Patient presents with  . Hypertension    HPI  Hypertension  This is a chronic problem. The current episode started more than 1 year ago. The problem has been gradually improving since onset. The problem is controlled. Pertinent negatives include no blurred vision, chest pain, palpitations or shortness of breath. Risk factors for coronary artery disease include dyslipidemia, post-menopausal state, sedentary lifestyle and obesity. The current treatment provides moderate improvement. Hypertensive end-organ damage includes kidney disease.     Past Medical History:  Diagnosis Date  . Hypertension      Family History  Problem Relation Age of Onset  . Diabetes Mother   . Hypertension Mother   . Diabetes Sister      Current Outpatient Medications:  .  amLODipine (NORVASC) 5 MG tablet, Take 1 tablet (5 mg total) by mouth daily., Disp: 90 tablet, Rfl: 0 .  aspirin 81 MG tablet, Take 81 mg by mouth daily., Disp: , Rfl:  .  hydrochlorothiazide (HYDRODIURIL) 25 MG tablet, TAKE 1 TABLET BY MOUTH EVERY DAY, Disp: 90 tablet, Rfl: 0 .  hydrocortisone (ANUSOL-HC) 2.5 % rectal cream, Apply rectally 2 times daily, Disp: 30 g, Rfl: 1 .  metoprolol succinate (TOPROL-XL) 100 MG 24 hr tablet, TAKE 1 TABLET BY MOUTH DAILY, Disp: 90 tablet, Rfl: 0 .  rosuvastatin (CRESTOR) 5 MG tablet, Take 1 tablet (5 mg total) by mouth daily., Disp: 90 tablet, Rfl: 1 .  cyclobenzaprine (FLEXERIL) 10 MG tablet, Take 1 tablet (10 mg total) by mouth 3 (three) times daily as needed for muscle spasms. (Patient not taking: Reported on 03/15/2019), Disp: 30 tablet, Rfl: 0   Allergies  Allergen Reactions  . Other     Food allergies     Review of Systems  Constitutional: Negative.   Eyes: Negative for blurred vision.  Respiratory: Negative.  Negative for shortness of breath.   Cardiovascular:  Negative.  Negative for chest pain and palpitations.  Gastrointestinal: Negative.   Neurological: Negative.   Psychiatric/Behavioral: Negative.      Today's Vitals   03/15/19 0925  BP: 126/84  Pulse: 74  Temp: 97.6 F (36.4 C)  TempSrc: Oral  Weight: 171 lb 6.4 oz (77.7 kg)  Height: 5' 1.8" (1.57 m)  PainSc: 0-No pain   Body mass index is 31.55 kg/m.   Objective:  Physical Exam Vitals signs and nursing note reviewed.  Constitutional:      Appearance: Normal appearance.  HENT:     Head: Normocephalic and atraumatic.  Cardiovascular:     Rate and Rhythm: Normal rate and regular rhythm.     Heart sounds: Normal heart sounds.  Pulmonary:     Effort: Pulmonary effort is normal.     Breath sounds: Normal breath sounds.  Skin:    General: Skin is warm.  Neurological:     General: No focal deficit present.     Mental Status: She is alert.  Psychiatric:        Mood and Affect: Mood normal.        Behavior: Behavior normal.         Assessment And Plan:     1. Hypertensive nephropathy  Well controlled.  She will continue with current meds. She is encouraged to avoid adding salt to her foods.   - Lipid panel - CMP14+EGFR  2. Chronic renal  disease, stage II  Chronic. I will check a GFR, Cr today.  She is encouraged to stay well hydrated.   3. Pure hypercholesterolemia  I will check fasting lipid panel and LFTs. She will continue with current meds. She is encouraged to avoid fried foods and to incorporate more exercise into her daily routine.   4. Other abnormal glucose  HER A1C HAS BEEN ELEVATED IN THE PAST. I WILL CHECK AN A1C, BMET TODAY. SHE WAS ENCOURAGED TO AVOID SUGARY BEVERAGES AND PROCESSED FOODS INCLUDNG BREADS, RICE AND PASTA.  - Hemoglobin A1c  5. Class 1 obesity due to excess calories with serious comorbidity and body mass index (BMI) of 31.0 to 31.9 in adult  She was congratulated on her 6 pound weight loss since Feb 2020. She is encouraged to  keep up the great work. She is advised to strive for 150 minutes per week of regular exercise.   [name], MD    THE PATIENT IS ENCOURAGED TO PRACTICE SOCIAL DISTANCING DUE TO THE COVID-19 PANDEMIC.

## 2019-03-15 NOTE — Patient Instructions (Signed)

## 2019-03-16 LAB — CMP14+EGFR
ALT: 16 IU/L (ref 0–32)
AST: 17 IU/L (ref 0–40)
Albumin/Globulin Ratio: 1.5 (ref 1.2–2.2)
Albumin: 4 g/dL (ref 3.8–4.8)
Alkaline Phosphatase: 66 IU/L (ref 39–117)
BUN/Creatinine Ratio: 14 (ref 12–28)
BUN: 12 mg/dL (ref 8–27)
Bilirubin Total: 0.5 mg/dL (ref 0.0–1.2)
CO2: 24 mmol/L (ref 20–29)
Calcium: 9.2 mg/dL (ref 8.7–10.3)
Chloride: 103 mmol/L (ref 96–106)
Creatinine, Ser: 0.86 mg/dL (ref 0.57–1.00)
GFR calc Af Amer: 81 mL/min/{1.73_m2} (ref >59)
GFR calc non Af Amer: 70 mL/min/{1.73_m2} (ref >59)
Globulin, Total: 2.6 g/dL (ref 1.5–4.5)
Glucose: 99 mg/dL (ref 65–99)
Potassium: 3.5 mmol/L (ref 3.5–5.2)
Sodium: 140 mmol/L (ref 134–144)
Total Protein: 6.6 g/dL (ref 6.0–8.5)

## 2019-03-16 LAB — LIPID PANEL
Chol/HDL Ratio: 2.5 ratio (ref 0.0–4.4)
Cholesterol, Total: 203 mg/dL — ABNORMAL HIGH (ref 100–199)
HDL: 82 mg/dL (ref >39)
LDL Calculated: 109 mg/dL — ABNORMAL HIGH (ref 0–99)
Triglycerides: 60 mg/dL (ref 0–149)
VLDL Cholesterol Cal: 12 mg/dL (ref 5–40)

## 2019-03-16 LAB — HEMOGLOBIN A1C
Est. average glucose Bld gHb Est-mCnc: 114 mg/dL
Hgb A1c MFr Bld: 5.6 % (ref 4.8–5.6)

## 2019-03-17 ENCOUNTER — Telehealth: Payer: Self-pay | Admitting: Neurology

## 2019-03-17 NOTE — Telephone Encounter (Signed)
Due to current COVID 19 pandemic, our office is severely reducing in office visits until further notice, in order to minimize the risk to our patients and healthcare providers.   Called patient to offer an in office visit since she has previously declined a virtual visit. Patient accepted an in office visit for 5/27 at 9 am with some precautions. Patient understands that upon arrival she will have her temperature checked/will be screened for COVID. Patient is aware that only one person is allowed in office with her if necessary and will also have temp checked.

## 2019-03-24 ENCOUNTER — Other Ambulatory Visit: Payer: Self-pay

## 2019-03-24 ENCOUNTER — Encounter: Payer: Self-pay | Admitting: Neurology

## 2019-03-24 ENCOUNTER — Ambulatory Visit (INDEPENDENT_AMBULATORY_CARE_PROVIDER_SITE_OTHER): Payer: Medicare Other | Admitting: Neurology

## 2019-03-24 VITALS — BP 190/87 | HR 71 | Temp 97.3°F | Ht 62.0 in | Wt 173.0 lb

## 2019-03-24 DIAGNOSIS — E669 Obesity, unspecified: Secondary | ICD-10-CM | POA: Diagnosis not present

## 2019-03-24 DIAGNOSIS — R0683 Snoring: Secondary | ICD-10-CM

## 2019-03-24 DIAGNOSIS — G478 Other sleep disorders: Secondary | ICD-10-CM

## 2019-03-24 DIAGNOSIS — G479 Sleep disorder, unspecified: Secondary | ICD-10-CM

## 2019-03-24 DIAGNOSIS — R51 Headache: Secondary | ICD-10-CM | POA: Diagnosis not present

## 2019-03-24 DIAGNOSIS — R519 Headache, unspecified: Secondary | ICD-10-CM

## 2019-03-24 DIAGNOSIS — G4700 Insomnia, unspecified: Secondary | ICD-10-CM

## 2019-03-24 NOTE — Progress Notes (Signed)
Subjective:    Patient ID: TRISTON LISANTI is a 68 y.o. female.  HPI     Star Age, MD, PhD [facility] [address] [place], [place] [address]  Dear Dr. Baird Cancer, I saw your patient, Austyn Perriello, upon your kind request in my sleep clinic today for initial consultation of her sleep disorder, in particular, concern for underlying obstructive sleep apnea.  The patient is unaccompanied today.  As you know, Ms. Westerhold is a 68 year old right-handed woman with an underlying medical history of hypertension, hyperlipidemia and mild obesity, who reports snoring and excessive daytime somnolence.  I reviewed your office note from 12/14/2018. Her Epworth sleepiness score is 5 out of 24, fatigue severity score is 23 out of 63.  She is married and lives with her husband.  They have 2 children.  She is retired.  She is a non-smoker and does not utilize alcohol and drinks caffeine in the form of coffee, 1 cup/day on average.  She is retired from working in [employer] information [employer] manager for about 40 years.  She has no pets at the house, she does not currently have a TV in her bedroom, she sleeps in a separate bedroom and then her husband who snores and has sleep apnea.  Bedtime is around 12:30 AM or later.  Her rise time is 8 AM or sooner.  She does not have night to night nocturia but has had the occasional morning headache.  She occasionally naps during the day.  She is not aware of any family history of OSA.  She has trouble going to sleep sometimes and trouble maintaining sleep.  She does not typically wake up fully rested.  She is on 3 blood pressure medications, of note her blood pressure is elevated today.    Her Past Medical History Is Significant For: Past Medical History:  Diagnosis Date  . High cholesterol   . Hypertension     Her Past Surgical History Is Significant For: Past Surgical History:  Procedure Laterality Date  . CHOLECYSTECTOMY    . TUBAL  LIGATION      Her Family History Is Significant For: Family History  Problem Relation Age of Onset  . Diabetes Mother   . Hypertension Mother   . Diabetes Sister     Her Social History Is Significant For: Social History   Socioeconomic History  . Marital status: Married    Spouse name: Not on file  . Number of children: Not on file  . Years of education: Not on file  . Highest education level: Not on file  Occupational History  . Not on file  Social Needs  . Financial resource strain: Not hard at all  . Food insecurity:    Worry: Never true    Inability: Never true  . Transportation needs:    Medical: No    Non-medical: No  Tobacco Use  . Smoking status: Never Smoker  . Smokeless tobacco: Never Used  Substance and Sexual Activity  . Alcohol use: Not Currently    Comment: occasional  . Drug use: No  . Sexual activity: Not Currently  Lifestyle  . Physical activity:    Days per week: 3 days    Minutes per session: 50 min  . Stress: Not at all  Relationships  . Social connections:    Talks on phone: Not on file    Gets together: Not on file    Attends religious service: Not on file    Active  member of club or organization: Not on file    Attends meetings of clubs or organizations: Not on file    Relationship status: Not on file  Other Topics Concern  . Not on file  Social History Narrative  . Not on file    Her Allergies Are:  Allergies  Allergen Reactions  . Other     Food allergies  :   Her Current Medications Are:  Outpatient Encounter Medications as of 03/24/2019  Medication Sig  . amLODipine (NORVASC) 5 MG tablet Take 1 tablet (5 mg total) by mouth daily.  Marland Kitchen aspirin 81 MG tablet Take 81 mg by mouth daily.  . cyclobenzaprine (FLEXERIL) 10 MG tablet Take 1 tablet (10 mg total) by mouth 3 (three) times daily as needed for muscle spasms.  . hydrochlorothiazide (HYDRODIURIL) 25 MG tablet TAKE 1 TABLET BY MOUTH EVERY DAY  . hydrocortisone (ANUSOL-HC)  2.5 % rectal cream Apply rectally 2 times daily  . metoprolol succinate (TOPROL-XL) 100 MG 24 hr tablet TAKE 1 TABLET BY MOUTH DAILY  . rosuvastatin (CRESTOR) 5 MG tablet Take 1 tablet (5 mg total) by mouth daily.   No facility-administered encounter medications on file as of 68/27/2020.   :  Review of Systems:  Out of a complete 14 point review of systems, all are reviewed and negative with the exception of these symptoms as listed below:  Review of Systems  Neurological:       Pt presents today to discuss her sleep. Pt has never had a sleep study but does endorse snoring.  Epworth Sleepiness Scale 0= would never doze 1= slight chance of dozing 2= moderate chance of dozing 3= high chance of dozing  Sitting and reading: 2 Watching TV: 1 Sitting inactive in a public place (ex. Theater or meeting): 0 As a passenger in a car for an hour without a break: 0 Lying down to rest in the afternoon: 2 Sitting and talking to someone: 0 Sitting quietly after lunch (no alcohol): 0 In a car, while stopped in traffic: 0 Total: 5     Objective:  Neurological Exam  Physical Exam Physical Examination:   Vitals:   03/24/19 0905  BP: (!) 190/87  Pulse: 71  Temp: (!) 97.3 F (36.3 C)    General Examination: The patient is a very pleasant 68 y.o. female in no acute distress. She appears well-developed and well-nourished and well groomed.   HEENT: Normocephalic, atraumatic, pupils are equal, round and reactive to light and accommodation. She wears corrective eyeglasses. Extraocular tracking is good without limitation to gaze excursion or nystagmus noted. Normal smooth pursuit is noted. Hearing is grossly intact. Face is symmetric with normal facial animation. Speech is clear with no dysarthria noted. There is no hypophonia. There is no lip, neck/head, jaw or voice tremor. Neck is supple with full range of passive and active motion. There are no carotid bruits on auscultation. Oropharynx exam  reveals: mild mouth dryness, adequate dental hygiene and moderate airway crowding, due to Small airway entry, redundant soft palate, longer Tongue.  Mallampati is class III.  Tonsils are on the smaller side.  She has a mild overbite.  Tongue protrudes centrally in palate elevates symmetrically. Neck size is 13.25 inches. Nasal inspection reveals no significant nasal mucosal bogginess or redness and no septal deviation.   Chest: Clear to auscultation without wheezing, rhonchi or crackles noted.  Heart: S1+S2+0, regular and normal without murmurs, rubs or gallops noted.   Abdomen: Soft, non-tender and non-distended with  normal bowel sounds appreciated on auscultation.  Extremities: There is trace Puffiness around both ankles.  Skin: Warm and dry without trophic changes noted.  Musculoskeletal: exam reveals no obvious joint deformities, tenderness or joint swelling or erythema.   Neurologically:  Mental status: The patient is awake, alert and oriented in all 4 spheres. Her immediate and remote memory, attention, language skills and fund of knowledge are appropriate. There is no evidence of aphasia, agnosia, apraxia or anomia. Speech is clear with normal prosody and enunciation. Thought process is linear. Mood is normal and affect is normal.  Cranial nerves II - XII are as described above under HEENT exam. In addition: shoulder shrug is normal with equal shoulder height noted. Motor exam: Normal bulk, strength and tone is noted. There is no drift, tremor or rebound. Romberg is negative. Fine motor skills and coordination: intact with normal finger taps, normal hand movements, normal rapid alternating patting, normal foot taps and normal foot agility.  Cerebellar testing: No dysmetria or intention tremor on finger to nose testing. Heel to shin is unremarkable bilaterally. There is no truncal or gait ataxia.  Sensory exam: intact to light touch in the upper and lower extremities.  Gait, station and  balance: She stands easily. No veering to one side is noted. No leaning to one side is noted. Posture is age-appropriate and stance is narrow based. Gait shows normal stride length and normal pace. No problems turning are noted. Tandem walk is unremarkable.            Assessment and Plan:  In summary, CHASTA DESHPANDE is a very pleasant 68 y.o.-year old female with an underlying medical history of hypertension, hyperlipidemia and mild obesity, whose history and physical exam are concerning for obstructive sleep apnea (OSA). I had a long chat with the patient about my findings and the diagnosis of OSA, its prognosis and treatment options. We talked about medical treatments, surgical interventions and non-pharmacological approaches. I explained in particular the risks and ramifications of untreated moderate to severe OSA, especially with respect to developing cardiovascular disease down the Road, including congestive heart failure, difficult to treat hypertension, cardiac arrhythmias, or stroke. Even type 2 diabetes has, in part, been linked to untreated OSA. Symptoms of untreated OSA include daytime sleepiness, memory problems, mood irritability and mood disorder such as depression and anxiety, lack of energy, as well as recurrent headaches, especially morning headaches. We talked about trying to maintain a healthy lifestyle in general, as well as the importance of weight control. I encouraged the patient to eat healthy, exercise daily and keep well hydrated, to keep a scheduled bedtime and wake time routine, to not skip any meals and eat healthy snacks in between meals. I advised the patient not to drive when feeling sleepy. I recommended the following at this time: sleep study.   I explained the sleep test procedure to the patient and also outlined possible surgical and non-surgical treatment options of OSA, including the use of a custom-made dental device (which would require a referral to a specialist  dentist or oral surgeon), upper airway surgical options, such as pillar implants, radiofrequency surgery, tongue base surgery, and UPPP (which would involve a referral to an ENT surgeon). Rarely, jaw surgery such as mandibular advancement may be considered.  I also explained the CPAP treatment option to the patient, who indicated that she would be willing to try CPAP if the need arises. I explained the importance of being compliant with PAP treatment, not only for insurance  purposes but primarily to improve Her symptoms, and for the patient's long term health benefit, including to reduce Her cardiovascular risks. I answered all her questions today and the patient was in agreement. I plan to see her back after the sleep study is completed and encouraged her to call with any interim questions, concerns, problems or updates.   Thank you very much for allowing me to participate in the care of this nice patient. If I can be of any further assistance to you please do not hesitate to call me at [phone].  Sincerely,   Star Age, MD, PhD

## 2019-03-24 NOTE — Patient Instructions (Signed)

## 2019-04-05 ENCOUNTER — Institutional Professional Consult (permissible substitution): Payer: Medicare Other | Admitting: Neurology

## 2019-04-07 ENCOUNTER — Other Ambulatory Visit: Payer: Self-pay | Admitting: Internal Medicine

## 2019-04-09 ENCOUNTER — Telehealth: Payer: Self-pay

## 2019-04-09 NOTE — Telephone Encounter (Signed)
Patient called requesting a refill on HCTZ 25mg  I returned pt call and notified Dr.Sanders sent over the refill on 06/10. YRL,RMA

## 2019-04-29 ENCOUNTER — Ambulatory Visit (INDEPENDENT_AMBULATORY_CARE_PROVIDER_SITE_OTHER): Payer: Medicare Other | Admitting: Neurology

## 2019-04-29 ENCOUNTER — Other Ambulatory Visit: Payer: Self-pay

## 2019-04-29 DIAGNOSIS — G4733 Obstructive sleep apnea (adult) (pediatric): Secondary | ICD-10-CM | POA: Diagnosis not present

## 2019-04-29 DIAGNOSIS — G479 Sleep disorder, unspecified: Secondary | ICD-10-CM

## 2019-04-29 DIAGNOSIS — R0683 Snoring: Secondary | ICD-10-CM

## 2019-04-29 DIAGNOSIS — E669 Obesity, unspecified: Secondary | ICD-10-CM

## 2019-04-29 DIAGNOSIS — G478 Other sleep disorders: Secondary | ICD-10-CM

## 2019-04-29 DIAGNOSIS — G4700 Insomnia, unspecified: Secondary | ICD-10-CM

## 2019-04-29 DIAGNOSIS — G4720 Circadian rhythm sleep disorder, unspecified type: Secondary | ICD-10-CM

## 2019-04-29 DIAGNOSIS — R519 Headache, unspecified: Secondary | ICD-10-CM

## 2019-05-05 ENCOUNTER — Telehealth: Payer: Self-pay

## 2019-05-05 NOTE — Procedures (Signed)
PATIENT'S NAME:  Kathryn Mendez, Kathryn Mendez DOB:      [birth year]      MR#:    [id number]     DATE OF RECORDING: 04/29/2019 REFERRING M.D.:  Glendale Chard MD Study Performed:   Baseline Polysomnogram HISTORY: 68 year old right-handed woman with an underlying medical history of hypertension, hyperlipidemia and mild obesity, who reports snoring and excessive daytime somnolence. The patient endorsed the Epworth Sleepiness Scale at 5 points. The patient's weight 173 pounds with a height of 62 (inches), resulting in a BMI of 31.6 kg/m2. The patient's neck circumference measured 13.2 inches.  CURRENT MEDICATIONS: Norvasc, ASA 81mg , Flexeril, Hydrodiuril, Toprol-XL, Crestor   PROCEDURE:  This is a multichannel digital polysomnogram utilizing the Somnostar 11.2 system.  Electrodes and sensors were applied and monitored per AASM Specifications.   EEG, EOG, Chin and Limb EMG, were sampled at 200 Hz.  ECG, Snore and Nasal Pressure, Thermal Airflow, Respiratory Effort, CPAP Flow and Pressure, Oximetry was sampled at 50 Hz. Digital video and audio were recorded.      BASELINE STUDY  Lights Out was at 22:01 and Lights On at 05:06.  Total recording time (TRT) was 426 minutes, with a total sleep time (TST) of 202.5 minutes.   The patient's sleep latency was 79 minutes, which is delayed. REM latency was 180.5 minutes, which is delayed.  The sleep efficiency was 47.5%, which is markedly reduced.     SLEEP ARCHITECTURE: WASO (Wake after sleep onset) was 144.5 minutes with several longer periods of wakefulness. There were 19 minutes in Stage N1, 100 minutes Stage N2, 62 minutes Stage N3 and 21.5 minutes in Stage REM.  The percentage of Stage N1 was 9.4%, which is slightly increased, Stage N2 was 49.4%, which is normal, Stage N3 was 30.6%, which is increased, and Stage R (REM sleep) was 10.6%, which is reduced. The arousals were noted as: 4 were spontaneous, 0 were associated with PLMs, 1 were associated with respiratory  events.  RESPIRATORY ANALYSIS:  There were a total of 18 respiratory events:  13 obstructive apneas, 0 central apneas and 0 mixed apneas with a total of 13 apneas and an apnea index (AI) of 3.9 /hour. There were 5 hypopneas with a hypopnea index of 1.5 /hour. The patient also had 0 respiratory event related arousals (RERAs).      The total APNEA/HYPOPNEA INDEX (AHI) was 5.3 /hour and the total RESPIRATORY DISTURBANCE INDEX was 5.3 /hour.  16 events occurred in REM sleep and 0 events in NREM. The REM AHI was 44.7 /hour, versus a non-REM AHI of .7. The patient spent 0 minutes of total sleep time in the supine position and 203 minutes in non-supine.. The supine AHI was 0.0 versus a non-supine AHI of 5.4.  OXYGEN SATURATION & C02:  The Wake baseline 02 saturation was 97%, with the lowest being 86%. Time spent below 89% saturation equaled 5 minutes.  PERIODIC LIMB MOVEMENTS: The patient had a total of 0 Periodic Limb Movements.  The Periodic Limb Movement (PLM) index was 0 and the PLM Arousal index was 0/hour.  Audio and video analysis did not show any abnormal or unusual movements, behaviors, phonations or vocalizations. The patient took 1 bathroom break. Intermittent mild snoring was noted. The EKG was in keeping with normal sinus rhythm (NSR).   Post-study, the patient indicated that sleep was worse than usual.   IMPRESSION:  1. Obstructive Sleep Apnea (OSA) 2. Dysfunctions associated with sleep stages or arousal from sleep  RECOMMENDATIONS:  1. This sleep  study shows overall borderline obstructive sleep apnea, with an AHI of 5.3/hour and O2 nadir of 86%. She has, essentially, isolated REM related OSA. The absence of supine sleep and reduced percentage of REM sleep may underestimate her AHI and O2 nadir. Treatment with positive airway pressure can be considered with autoPAP, if desired by patient. Treatment options otherwise include weight loss and avoidance of the supine sleep position or a  dental device. These different avenues will be discussed with the patient. The patient will be seen in follow up in sleep clinic, if necessary.  2. This study shows sleep fragmentation and abnormal sleep stage percentages; these are nonspecific findings and per se do not signify an intrinsic sleep disorder or a cause for the patient's sleep-related symptoms. Causes include (but are not limited to) the first night effect of the sleep study, circadian rhythm disturbances, medication effect or an underlying mood disorder or medical problem.  3. The patient should be cautioned not to drive, work at heights, or operate dangerous or heavy equipment when tired or sleepy. Review and reiteration of good sleep hygiene measures should be pursued with any patient. 4. The patient will be advised to follow up with the referring provider, who will be notified of the test results.  I certify that I have reviewed the entire raw data recording prior to the issuance of this report in accordance with the Standards of Accreditation of the American Academy of Sleep Medicine (AASM)    Star Age, MD, PhD Diplomat, American Board of Neurology and Sleep Medicine (Neurology and Sleep Medicine)

## 2019-05-05 NOTE — Progress Notes (Signed)
Patient referred by Dr. Baird Cancer, seen by me on 03/24/19, diagnostic PSG on 04/29/19.    Please call and notify the patient that the recent sleep study did confirm the diagnosis of obstructive sleep apnea. OSA is overall borderline, with an AHI of 5.3/hour and O2 nadir of 86%. She has, essentially, isolated REM related OSA. The absence of supine sleep and reduced percentage of REM sleep may underestimate her AHI and O2 nadir. Treatment with positive airway pressure can be considered with autoPAP, if desired by patient. Treatment options otherwise include weight loss and avoidance of the supine sleep position or a dental device.  We can start autoPAP to see if she feels better after treatment. I have not put an order in yet. Please let me know, if she would like to have an autoPAP machine at home, through a DME company. The DME representative will educate her on how to use the machine, how to put the mask on, etc. We would need a FU in sleep clinic for 10 weeks post-PAP set up, please arrange that with me or one of our NPs. Thanks,   Star Age, MD, PhD [facility])

## 2019-05-05 NOTE — Telephone Encounter (Signed)
-----   Message from Star Age, MD sent at 05/05/2019  9:32 AM EDT ----- Patient referred by Dr. Baird Cancer, seen by me on 03/24/19, diagnostic PSG on 04/29/19.    Please call and notify the patient that the recent sleep study did confirm the diagnosis of obstructive sleep apnea. OSA is overall borderline, with an AHI of 5.3/hour and O2 nadir of 86%. She has, essentially, isolated REM related OSA. The absence of supine sleep and reduced percentage of REM sleep may underestimate her AHI and O2 nadir. Treatment with positive airway pressure can be considered with autoPAP, if desired by patient. Treatment options otherwise include weight loss and avoidance of the supine sleep position or a dental device.  We can start autoPAP to see if she feels better after treatment. I have not put an order in yet. Please let me know, if she would like to have an autoPAP machine at home, through a DME company. The DME representative will educate her on how to use the machine, how to put the mask on, etc. We would need a FU in sleep clinic for 10 weeks post-PAP set up, please arrange that with me or one of our NPs. Thanks,   Star Age, MD, PhD [facility])

## 2019-05-05 NOTE — Telephone Encounter (Signed)
I called pt, discussed her sleep study results and recommendations. Pt will consider an auto pap and let us know if she decides to pursue that option. Pt verbalized understanding of results. Pt had no questions at this time but was encouraged to call back if questions arise.

## 2019-05-07 ENCOUNTER — Other Ambulatory Visit: Payer: Self-pay

## 2019-05-07 MED ORDER — AMLODIPINE BESYLATE 5 MG PO TABS: 5.0000 mg | ORAL_TABLET | Freq: Every day | ORAL | 1 refills | Status: DC

## 2019-05-10 ENCOUNTER — Other Ambulatory Visit: Payer: Self-pay

## 2019-05-13 ENCOUNTER — Other Ambulatory Visit: Payer: Self-pay | Admitting: Internal Medicine

## 2019-07-16 DIAGNOSIS — Z23 Encounter for immunization: Secondary | ICD-10-CM | POA: Diagnosis not present

## 2019-07-27 ENCOUNTER — Other Ambulatory Visit: Payer: Self-pay

## 2019-07-27 MED ORDER — HYDROCHLOROTHIAZIDE 25 MG PO TABS: 25.0000 mg | ORAL_TABLET | Freq: Every day | ORAL | 0 refills | Status: DC

## 2019-09-01 ENCOUNTER — Other Ambulatory Visit: Payer: Self-pay

## 2019-09-01 MED ORDER — METOPROLOL SUCCINATE ER 100 MG PO TB24: 100.0000 mg | ORAL_TABLET | Freq: Every day | ORAL | 0 refills | Status: DC

## 2019-09-16 ENCOUNTER — Ambulatory Visit (INDEPENDENT_AMBULATORY_CARE_PROVIDER_SITE_OTHER): Payer: Medicare Other

## 2019-09-16 ENCOUNTER — Other Ambulatory Visit: Payer: Self-pay

## 2019-09-16 ENCOUNTER — Ambulatory Visit (INDEPENDENT_AMBULATORY_CARE_PROVIDER_SITE_OTHER): Payer: Medicare Other | Admitting: Internal Medicine

## 2019-09-16 ENCOUNTER — Encounter: Payer: Self-pay | Admitting: Internal Medicine

## 2019-09-16 VITALS — BP 142/82 | HR 71 | Temp 98.2°F | Ht 62.2 in | Wt 175.2 lb

## 2019-09-16 VITALS — BP 142/82 | HR 71 | Temp 98.2°F | Ht 62.2 in | Wt 175.0 lb

## 2019-09-16 DIAGNOSIS — E6609 Other obesity due to excess calories: Secondary | ICD-10-CM | POA: Diagnosis not present

## 2019-09-16 DIAGNOSIS — Z6831 Body mass index (BMI) 31.0-31.9, adult: Secondary | ICD-10-CM

## 2019-09-16 DIAGNOSIS — I10 Essential (primary) hypertension: Secondary | ICD-10-CM

## 2019-09-16 DIAGNOSIS — Z0000 Encounter for general adult medical examination without abnormal findings: Secondary | ICD-10-CM | POA: Diagnosis not present

## 2019-09-16 DIAGNOSIS — N182 Chronic kidney disease, stage 2 (mild): Secondary | ICD-10-CM | POA: Diagnosis not present

## 2019-09-16 DIAGNOSIS — E7800 Pure hypercholesterolemia, unspecified: Secondary | ICD-10-CM

## 2019-09-16 DIAGNOSIS — I129 Hypertensive chronic kidney disease with stage 1 through stage 4 chronic kidney disease, or unspecified chronic kidney disease: Secondary | ICD-10-CM

## 2019-09-16 LAB — POCT UA - MICROALBUMIN
Creatinine, POC: 200 mg/dL
Microalbumin Ur, POC: 150 mg/L

## 2019-09-16 MED ORDER — HYDROCHLOROTHIAZIDE 25 MG PO TABS: 25.0000 mg | ORAL_TABLET | Freq: Every day | ORAL | 1 refills | Status: DC

## 2019-09-16 MED ORDER — METOPROLOL SUCCINATE ER 100 MG PO TB24: 100.0000 mg | ORAL_TABLET | Freq: Every day | ORAL | 1 refills | Status: DC

## 2019-09-16 MED ORDER — AMLODIPINE BESYLATE 5 MG PO TABS: 5.0000 mg | ORAL_TABLET | Freq: Every day | ORAL | 1 refills | Status: DC

## 2019-09-16 NOTE — Progress Notes (Signed)
Subjective:     Patient ID: Kathryn Mendez , female    DOB: 1951/01/15 , 68 y.o.   MRN: [id number]   Chief Complaint  Patient presents with  . Hypertension    HPI  Hypertension This is a chronic problem. The current episode started more than 1 year ago. The problem has been gradually improving since onset. The problem is controlled. Pertinent negatives include no blurred vision, chest pain, palpitations or shortness of breath. Risk factors for coronary artery disease include dyslipidemia, post-menopausal state, sedentary lifestyle and obesity. The current treatment provides moderate improvement. Hypertensive end-organ damage includes kidney disease.     Past Medical History:  Diagnosis Date  . High cholesterol   . Hypertension      Family History  Problem Relation Age of Onset  . Diabetes Mother   . Hypertension Mother   . Diabetes Sister      Current Outpatient Medications:  .  amLODipine (NORVASC) 5 MG tablet, Take 1 tablet (5 mg total) by mouth daily., Disp: 90 tablet, Rfl: 1 .  aspirin 81 MG tablet, Take 81 mg by mouth daily., Disp: , Rfl:  .  cyclobenzaprine (FLEXERIL) 10 MG tablet, Take 1 tablet (10 mg total) by mouth 3 (three) times daily as needed for muscle spasms. (Patient not taking: Reported on 09/16/2019), Disp: 30 tablet, Rfl: 0 .  hydrochlorothiazide (HYDRODIURIL) 25 MG tablet, Take 1 tablet (25 mg total) by mouth daily., Disp: 90 tablet, Rfl: 0 .  hydrocortisone (ANUSOL-HC) 2.5 % rectal cream, Apply rectally 2 times daily (Patient not taking: Reported on 09/16/2019), Disp: 30 g, Rfl: 1 .  metoprolol succinate (TOPROL-XL) 100 MG 24 hr tablet, Take 1 tablet (100 mg total) by mouth daily. Take with or immediately following a meal., Disp: 90 tablet, Rfl: 0 .  rosuvastatin (CRESTOR) 5 MG tablet, Take 1 tablet (5 mg total) by mouth daily., Disp: 90 tablet, Rfl: 1   Allergies  Allergen Reactions  . Other     Food allergies     Review of Systems   Constitutional: Negative.   Eyes: Negative for blurred vision.  Respiratory: Negative.  Negative for shortness of breath.   Cardiovascular: Negative.  Negative for chest pain and palpitations.  Gastrointestinal: Negative.   Neurological: Negative.   Psychiatric/Behavioral: Negative.      Today's Vitals   09/16/19 0857  BP: (!) 142/82  Pulse: 71  Temp: 98.2 F (36.8 C)  TempSrc: Oral  Weight: 175 lb (79.4 kg)  Height: 5' 2.2" (1.58 m)  PainSc: 0-No pain   Body mass index is 31.8 kg/m.   Objective:  Physical Exam Vitals signs and nursing note reviewed.  Constitutional:      Appearance: Normal appearance.  HENT:     Head: Normocephalic and atraumatic.  Cardiovascular:     Rate and Rhythm: Normal rate and regular rhythm.     Heart sounds: Normal heart sounds.  Pulmonary:     Effort: Pulmonary effort is normal.     Breath sounds: Normal breath sounds.  Skin:    General: Skin is warm.  Neurological:     General: No focal deficit present.     Mental Status: She is alert.  Psychiatric:        Mood and Affect: Mood normal.        Behavior: Behavior normal.         Assessment And Plan:     1. Hypertensive nephropathy  Fair control. She will continue with current meds for  now. She is encouraged to avoid adding salt to her foods. She is also encouraged to incorporate more exercise into her daily routine.   2. Chronic renal disease, stage II  Chronic, I will check GFR, Cr today. She is encouraged to stay well hydrated.   - CMP14+EGFR  3. Pure hypercholesterolemia  Chronic. She is encouraged to avoid fried foods, increase her fish intake. Increase her fiber intake and to increase exercise to 150 minutes per week.   - Lipid panel  4. Class 1 obesity due to excess calories with serious comorbidity and body mass index (BMI) of 31.0 to 31.9 in adult  Importance of achieving optimal weight to decrease risk of cardiovascular disease and cancers was discussed with the  patient in full detail. She is encouraged to start slowly - start with 10 minutes twice daily at least three to four days per week and to gradually build to 30 minutes five days weekly. She was given tips to incorporate more activity into her daily routine - take stairs when possible, park farther away from her job, grocery stores, etc.    [name], MD    THE PATIENT IS ENCOURAGED TO PRACTICE SOCIAL DISTANCING DUE TO THE COVID-19 PANDEMIC.

## 2019-09-16 NOTE — Progress Notes (Signed)
Subjective:   Kathryn Mendez is a 68 y.o. female who presents for Medicare Annual (Subsequent) preventive examination.  Review of Systems:  n/a Cardiac Risk Factors include: advanced age (>43men, >21 women);hypertension;dyslipidemia;obesity (BMI >30kg/m2)     Objective:     Vitals: BP (!) 142/82 (BP Location: Left Arm, Patient Position: Sitting, Cuff Size: Normal)   Pulse 71   Temp 98.2 F (36.8 C) (Oral)   Ht 5' 2.2" (1.58 m)   Wt 175 lb 3.2 oz (79.5 kg)   SpO2 98%   BMI 31.84 kg/m   Body mass index is 31.84 kg/m.  Advanced Directives 09/16/2019 09/10/2018  Does Patient Have a Medical Advance Directive? No No  Would patient like information on creating a medical advance directive? No - Patient declined Yes (MAU/Ambulatory/Procedural Areas - Information given)    Tobacco Social History   Tobacco Use  Smoking Status Never Smoker  Smokeless Tobacco Never Used     Counseling given: Not Answered   Clinical Intake:  Pre-visit preparation completed: Yes  Pain : No/denies pain     Nutritional Status: BMI > 30  Obese Nutritional Risks: None Diabetes: No  How often do you need to have someone help you when you read instructions, pamphlets, or other written materials from your doctor or pharmacy?: 1 - Never What is the last grade level you completed in school?: 11th grade  Interpreter Needed?: No  Information entered by :: NAllen LPN  Past Medical History:  Diagnosis Date  . High cholesterol   . Hypertension    Past Surgical History:  Procedure Laterality Date  . CHOLECYSTECTOMY    . TUBAL LIGATION     Family History  Problem Relation Age of Onset  . Diabetes Mother   . Hypertension Mother   . Diabetes Sister    Social History   Socioeconomic History  . Marital status: Married    Spouse name: Not on file  . Number of children: Not on file  . Years of education: Not on file  . Highest education level: Not on file  Occupational History   . Occupation: retired  Scientific laboratory technician  . Financial resource strain: Not hard at all  . Food insecurity    Worry: Never true    Inability: Never true  . Transportation needs    Medical: No    Non-medical: No  Tobacco Use  . Smoking status: Never Smoker  . Smokeless tobacco: Never Used  Substance and Sexual Activity  . Alcohol use: Not Currently    Comment: occasional  . Drug use: No  . Sexual activity: Not Currently  Lifestyle  . Physical activity    Days per week: 3 days    Minutes per session: 50 min  . Stress: Not at all  Relationships  . Social Herbalist on phone: Not on file    Gets together: Not on file    Attends religious service: Not on file    Active member of club or organization: Not on file    Attends meetings of clubs or organizations: Not on file    Relationship status: Not on file  Other Topics Concern  . Not on file  Social History Narrative  . Not on file    Outpatient Encounter Medications as of 09/16/2019  Medication Sig  . aspirin 81 MG tablet Take 81 mg by mouth daily.  . rosuvastatin (CRESTOR) 5 MG tablet Take 1 tablet (5 mg total) by mouth daily.  . [  DISCONTINUED] amLODipine (NORVASC) 5 MG tablet Take 1 tablet (5 mg total) by mouth daily.  . [DISCONTINUED] hydrochlorothiazide (HYDRODIURIL) 25 MG tablet Take 1 tablet (25 mg total) by mouth daily.  . [DISCONTINUED] metoprolol succinate (TOPROL-XL) 100 MG 24 hr tablet Take 1 tablet (100 mg total) by mouth daily. Take with or immediately following a meal.  . cyclobenzaprine (FLEXERIL) 10 MG tablet Take 1 tablet (10 mg total) by mouth 3 (three) times daily as needed for muscle spasms. (Patient not taking: Reported on 09/16/2019)  . hydrocortisone (ANUSOL-HC) 2.5 % rectal cream Apply rectally 2 times daily (Patient not taking: Reported on 09/16/2019)   No facility-administered encounter medications on file as of 09/16/2019.     Activities of Daily Living In your present state of health, do  you have any difficulty performing the following activities: 09/16/2019  Hearing? N  Vision? N  Difficulty concentrating or making decisions? N  Walking or climbing stairs? N  Dressing or bathing? N  Doing errands, shopping? N  Preparing Food and eating ? N  Using the Toilet? N  In the past six months, have you accidently leaked urine? N  Do you have problems with loss of bowel control? N  Managing your Medications? N  Managing your Finances? N  Housekeeping or managing your Housekeeping? N  Some recent data might be hidden    Patient Care Team: Glendale Chard, MD as PCP - General (Internal Medicine) Rutherford Guys, MD as Consulting Physician (Ophthalmology)    Assessment:   This is a routine wellness examination for Kathryn Mendez.  Exercise Activities and Dietary recommendations Current Exercise Habits: Home exercise routine, Type of exercise: walking, Time (Minutes): 50, Frequency (Times/Week): 3, Weekly Exercise (Minutes/Week): 150  Goals    . Patient Stated     09/16/2019, just to stay healthy    . Weight (lb) < 200 lb (90.7 kg) (pt-stated)     Just watching weight       Fall Risk Fall Risk  09/16/2019 03/15/2019 12/14/2018 11/04/2018 09/14/2018  Falls in the past year? 0 0 0 0 0  Comment - - - - Emmi Telephone Survey: data to providers prior to load  Risk for fall due to : Medication side effect - - - -  Follow up Falls evaluation completed;Education provided;Falls prevention discussed - - - -   Is the patient's home free of loose throw rugs in walkways, pet beds, electrical cords, etc?   yes      Grab bars in the bathroom? yes      Handrails on the stairs?   n/a      Adequate lighting?   yes  Timed Get Up and Go performed: n/a  Depression Screen PHQ 2/9 Scores 09/16/2019 03/15/2019 12/14/2018 11/04/2018  PHQ - 2 Score 0 0 0 0  PHQ- 9 Score 0 - - -     Cognitive Function     6CIT Screen 09/16/2019 09/10/2018  What Year? 0 points 0 points  What month? 0 points 0  points  What time? 0 points 0 points  Count back from 20 0 points 0 points  Months in reverse 0 points 4 points  Repeat phrase 4 points 2 points  Total Score 4 6    Immunization History  Administered Date(s) Administered  . Influenza, High Dose Seasonal PF 07/16/2019    Qualifies for Shingles Vaccine? yes  Screening Tests Health Maintenance  Topic Date Due  . PNA vac Low Risk Adult (2 of 2 - PPSV23) 09/15/2020 (  Originally 07/11/2017)  . MAMMOGRAM  11/06/2020  . TETANUS/TDAP  08/03/2023  . COLONOSCOPY  04/15/2028  . INFLUENZA VACCINE  Completed  . DEXA SCAN  Completed  . Hepatitis C Screening  Completed    Cancer Screenings: Lung: Low Dose CT Chest recommended if Age 65-80 years, 30 pack-year currently smoking OR have quit w/in 15years. Patient does not qualify. Breast:  Up to date on Mammogram? Yes   Up to date of Bone Density/Dexa? Yes Colorectal: up to date  Additional Screenings: : Hepatitis C Screening: 03/14/2014     Plan:    Patient would like to stay healthy.   I have personally reviewed and noted the following in the patient's chart:   . Medical and social history . Use of alcohol, tobacco or illicit drugs  . Current medications and supplements . Functional ability and status . Nutritional status . Physical activity . Advanced directives . List of other physicians . Hospitalizations, surgeries, and ER visits in previous 12 months . Vitals . Screenings to include cognitive, depression, and falls . Referrals and appointments  In addition, I have reviewed and discussed with patient certain preventive protocols, quality metrics, and best practice recommendations. A written personalized care plan for preventive services as well as general preventive health recommendations were provided to patient.     Kellie Simmering, LPN  [phone]

## 2019-09-16 NOTE — Patient Instructions (Signed)
Kathryn Mendez , Thank you for taking time to come for your Medicare Wellness Visit. I appreciate your ongoing commitment to your health goals. Please review the following plan we discussed and let me know if I can assist you in the future.   Screening recommendations/referrals: Colonoscopy: 03/2018 Mammogram: 10/2018 Bone Density: 08/2017 Recommended yearly ophthalmology/optometry visit for glaucoma screening and checkup Recommended yearly dental visit for hygiene and checkup  Vaccinations: Influenza vaccine: 06/2019 Pneumococcal vaccine: 06/2013 Tdap vaccine: 07/2013 Shingles vaccine: discussed    Advanced directives: Advance directive discussed with you today. Even though you declined this today please call our office should you change your mind and we can give you the proper paperwork for you to fill out.   Conditions/risks identified: obesity  Next appointment: 12/22/2019 at 9:45   Preventive Care 65 Years and Older, Female Preventive care refers to lifestyle choices and visits with your health care provider that can promote health and wellness. What does preventive care include?  A yearly physical exam. This is also called an annual well check.  Dental exams once or twice a year.  Routine eye exams. Ask your health care provider how often you should have your eyes checked.  Personal lifestyle choices, including:  Daily care of your teeth and gums.  Regular physical activity.  Eating a healthy diet.  Avoiding tobacco and drug use.  Limiting alcohol use.  Practicing safe sex.  Taking low-dose aspirin every day.  Taking vitamin and mineral supplements as recommended by your health care provider. What happens during an annual well check? The services and screenings done by your health care provider during your annual well check will depend on your age, overall health, lifestyle risk factors, and family history of disease. Counseling  Your health care provider may  ask you questions about your:  Alcohol use.  Tobacco use.  Drug use.  Emotional well-being.  Home and relationship well-being.  Sexual activity.  Eating habits.  History of falls.  Memory and ability to understand (cognition).  Work and work Statistician.  Reproductive health. Screening  You may have the following tests or measurements:  Height, weight, and BMI.  Blood pressure.  Lipid and cholesterol levels. These may be checked every 5 years, or more frequently if you are over 106 years old.  Skin check.  Lung cancer screening. You may have this screening every year starting at age 58 if you have a 30-pack-year history of smoking and currently smoke or have quit within the past 15 years.  Fecal occult blood test (FOBT) of the stool. You may have this test every year starting at age 76.  Flexible sigmoidoscopy or colonoscopy. You may have a sigmoidoscopy every 5 years or a colonoscopy every 10 years starting at age 16.  Hepatitis C blood test.  Hepatitis B blood test.  Sexually transmitted disease (STD) testing.  Diabetes screening. This is done by checking your blood sugar (glucose) after you have not eaten for a while (fasting). You may have this done every 1-3 years.  Bone density scan. This is done to screen for osteoporosis. You may have this done starting at age 16.  Mammogram. This may be done every 1-2 years. Talk to your health care provider about how often you should have regular mammograms. Talk with your health care provider about your test results, treatment options, and if necessary, the need for more tests. Vaccines  Your health care provider may recommend certain vaccines, such as:  Influenza vaccine. This is recommended every  year.  Tetanus, diphtheria, and acellular pertussis (Tdap, Td) vaccine. You may need a Td booster every 10 years.  Zoster vaccine. You may need this after age 68.  Pneumococcal 13-valent conjugate (PCV13) vaccine. One  dose is recommended after age 5.  Pneumococcal polysaccharide (PPSV23) vaccine. One dose is recommended after age 48. Talk to your health care provider about which screenings and vaccines you need and how often you need them. This information is not intended to replace advice given to you by your health care provider. Make sure you discuss any questions you have with your health care provider. Document Released: 11/10/2015 Document Revised: 07/03/2016 Document Reviewed: 08/15/2015 Elsevier Interactive Patient Education  2017 [place] Prevention in the Home Falls can cause injuries. They can happen to people of all ages. There are many things you can do to make your home safe and to help prevent falls. What can I do on the outside of my home?  Regularly fix the edges of walkways and driveways and fix any cracks.  Remove anything that might make you trip as you walk through a door, such as a raised step or threshold.  Trim any bushes or trees on the path to your home.  Use bright outdoor lighting.  Clear any walking paths of anything that might make someone trip, such as rocks or tools.  Regularly check to see if handrails are loose or broken. Make sure that both sides of any steps have handrails.  Any raised decks and porches should have guardrails on the edges.  Have any leaves, snow, or ice cleared regularly.  Use sand or salt on walking paths during winter.  Clean up any spills in your garage right away. This includes oil or grease spills. What can I do in the bathroom?  Use night lights.  Install grab bars by the toilet and in the tub and shower. Do not use towel bars as grab bars.  Use non-skid mats or decals in the tub or shower.  If you need to sit down in the shower, use a plastic, non-slip stool.  Keep the floor dry. Clean up any water that spills on the floor as soon as it happens.  Remove soap buildup in the tub or shower regularly.  Attach bath  mats securely with double-sided non-slip rug tape.  Do not have throw rugs and other things on the floor that can make you trip. What can I do in the bedroom?  Use night lights.  Make sure that you have a light by your bed that is easy to reach.  Do not use any sheets or blankets that are too big for your bed. They should not hang down onto the floor.  Have a firm chair that has side arms. You can use this for support while you get dressed.  Do not have throw rugs and other things on the floor that can make you trip. What can I do in the kitchen?  Clean up any spills right away.  Avoid walking on wet floors.  Keep items that you use a lot in easy-to-reach places.  If you need to reach something above you, use a strong step stool that has a grab bar.  Keep electrical cords out of the way.  Do not use floor polish or wax that makes floors slippery. If you must use wax, use non-skid floor wax.  Do not have throw rugs and other things on the floor that can make you trip. What can  I do with my stairs?  Do not leave any items on the stairs.  Make sure that there are handrails on both sides of the stairs and use them. Fix handrails that are broken or loose. Make sure that handrails are as long as the stairways.  Check any carpeting to make sure that it is firmly attached to the stairs. Fix any carpet that is loose or worn.  Avoid having throw rugs at the top or bottom of the stairs. If you do have throw rugs, attach them to the floor with carpet tape.  Make sure that you have a light switch at the top of the stairs and the bottom of the stairs. If you do not have them, ask someone to add them for you. What else can I do to help prevent falls?  Wear shoes that:  Do not have high heels.  Have rubber bottoms.  Are comfortable and fit you well.  Are closed at the toe. Do not wear sandals.  If you use a stepladder:  Make sure that it is fully opened. Do not climb a closed  stepladder.  Make sure that both sides of the stepladder are locked into place.  Ask someone to hold it for you, if possible.  Clearly mark and make sure that you can see:  Any grab bars or handrails.  First and last steps.  Where the edge of each step is.  Use tools that help you move around (mobility aids) if they are needed. These include:  Canes.  Walkers.  Scooters.  Crutches.  Turn on the lights when you go into a dark area. Replace any light bulbs as soon as they burn out.  Set up your furniture so you have a clear path. Avoid moving your furniture around.  If any of your floors are uneven, fix them.  If there are any pets around you, be aware of where they are.  Review your medicines with your doctor. Some medicines can make you feel dizzy. This can increase your chance of falling. Ask your doctor what other things that you can do to help prevent falls. This information is not intended to replace advice given to you by your health care provider. Make sure you discuss any questions you have with your health care provider. Document Released: 08/10/2009 Document Revised: 03/21/2016 Document Reviewed: 11/18/2014 Elsevier Interactive Patient Education  2017 [employer].

## 2019-09-16 NOTE — Patient Instructions (Signed)
Take amlodipine and hydrochlorothiazide in AM  Take metoprolol in PM    DASH Eating Plan DASH stands for "Dietary Approaches to Stop Hypertension." The DASH eating plan is a healthy eating plan that has been shown to reduce high blood pressure (hypertension). It may also reduce your risk for type 2 diabetes, heart disease, and stroke. The DASH eating plan may also help with weight loss. What are tips for following this plan?  General guidelines  Avoid eating more than 2,300 mg (milligrams) of salt (sodium) a day. If you have hypertension, you may need to reduce your sodium intake to 1,500 mg a day.  Limit alcohol intake to no more than 1 drink a day for nonpregnant women and 2 drinks a day for men. One drink equals 12 oz of beer, 5 oz of wine, or 1 oz of hard liquor.  Work with your health care provider to maintain a healthy body weight or to lose weight. Ask what an ideal weight is for you.  Get at least 30 minutes of exercise that causes your heart to beat faster (aerobic exercise) most days of the week. Activities may include walking, swimming, or biking.  Work with your health care provider or diet and nutrition specialist (dietitian) to adjust your eating plan to your individual calorie needs. Reading food labels   Check food labels for the amount of sodium per serving. Choose foods with less than 5 percent of the Daily Value of sodium. Generally, foods with less than 300 mg of sodium per serving fit into this eating plan.  To find whole grains, look for the word "whole" as the first word in the ingredient list. Shopping  Buy products labeled as "low-sodium" or "no salt added."  Buy fresh foods. Avoid canned foods and premade or frozen meals. Cooking  Avoid adding salt when cooking. Use salt-free seasonings or herbs instead of table salt or sea salt. Check with your health care provider or pharmacist before using salt substitutes.  Do not fry foods. Cook foods using healthy  methods such as baking, boiling, grilling, and broiling instead.  Cook with heart-healthy oils, such as olive, canola, soybean, or sunflower oil. Meal planning  Eat a balanced diet that includes: ? 5 or more servings of fruits and vegetables each day. At each meal, try to fill half of your plate with fruits and vegetables. ? Up to 6-8 servings of whole grains each day. ? Less than 6 oz of lean meat, poultry, or fish each day. A 3-oz serving of meat is about the same size as a deck of cards. One egg equals 1 oz. ? 2 servings of low-fat dairy each day. ? A serving of nuts, seeds, or beans 5 times each week. ? Heart-healthy fats. Healthy fats called Omega-3 fatty acids are found in foods such as flaxseeds and coldwater fish, like sardines, salmon, and mackerel.  Limit how much you eat of the following: ? Canned or prepackaged foods. ? Food that is high in trans fat, such as fried foods. ? Food that is high in saturated fat, such as fatty meat. ? Sweets, desserts, sugary drinks, and other foods with added sugar. ? Full-fat dairy products.  Do not salt foods before eating.  Try to eat at least 2 vegetarian meals each week.  Eat more home-cooked food and less restaurant, buffet, and fast food.  When eating at a restaurant, ask that your food be prepared with less salt or no salt, if possible. What foods are recommended?  The items listed may not be a complete list. Talk with your dietitian about what dietary choices are best for you. Grains Whole-grain or whole-wheat bread. Whole-grain or whole-wheat pasta. Brown rice. [place]. Bulgur. Whole-grain and low-sodium cereals. Pita bread. Low-fat, low-sodium crackers. Whole-wheat flour tortillas. Vegetables Fresh or frozen vegetables (raw, steamed, roasted, or grilled). Low-sodium or reduced-sodium tomato and vegetable juice. Low-sodium or reduced-sodium tomato sauce and tomato paste. Low-sodium or reduced-sodium canned  vegetables. Fruits All fresh, dried, or frozen fruit. Canned fruit in natural juice (without added sugar). Meat and other protein foods Skinless chicken or [place]. Ground chicken or [place]. Pork with fat trimmed off. Fish and seafood. Egg whites. Dried beans, peas, or lentils. Unsalted nuts, nut butters, and seeds. Unsalted canned beans. Lean cuts of beef with fat trimmed off. Low-sodium, lean deli meat. Dairy Low-fat (1%) or fat-free (skim) milk. Fat-free, low-fat, or reduced-fat cheeses. Nonfat, low-sodium ricotta or cottage cheese. Low-fat or nonfat yogurt. Low-fat, low-sodium cheese. Fats and oils Soft margarine without trans fats. Vegetable oil. Low-fat, reduced-fat, or light mayonnaise and salad dressings (reduced-sodium). Canola, safflower, olive, soybean, and sunflower oils. Avocado. Seasoning and other foods Herbs. Spices. Seasoning mixes without salt. Unsalted popcorn and pretzels. Fat-free sweets. What foods are not recommended? The items listed may not be a complete list. Talk with your dietitian about what dietary choices are best for you. Grains Baked goods made with fat, such as croissants, muffins, or some breads. Dry pasta or rice meal packs. Vegetables Creamed or fried vegetables. Vegetables in a cheese sauce. Regular canned vegetables (not low-sodium or reduced-sodium). Regular canned tomato sauce and paste (not low-sodium or reduced-sodium). Regular tomato and vegetable juice (not low-sodium or reduced-sodium). Angie Fava. Olives. Fruits Canned fruit in a light or heavy syrup. Fried fruit. Fruit in cream or butter sauce. Meat and other protein foods Fatty cuts of meat. Ribs. Fried meat. Berniece Salines. Sausage. Bologna and other processed lunch meats. Salami. Fatback. Hotdogs. Bratwurst. Salted nuts and seeds. Canned beans with added salt. Canned or smoked fish. Whole eggs or egg yolks. Chicken or [place] with skin. Dairy Whole or 2% milk, cream, and half-and-half. Whole or full-fat  cream cheese. Whole-fat or sweetened yogurt. Full-fat cheese. Nondairy creamers. Whipped toppings. Processed cheese and cheese spreads. Fats and oils Butter. Stick margarine. Lard. Shortening. Ghee. Bacon fat. Tropical oils, such as coconut, palm kernel, or palm oil. Seasoning and other foods Salted popcorn and pretzels. Onion salt, garlic salt, seasoned salt, table salt, and sea salt. Worcestershire sauce. Tartar sauce. Barbecue sauce. Teriyaki sauce. Soy sauce, including reduced-sodium. Steak sauce. Canned and packaged gravies. Fish sauce. Oyster sauce. Cocktail sauce. Horseradish that you find on the shelf. Ketchup. Mustard. Meat flavorings and tenderizers. Bouillon cubes. Hot sauce and Tabasco sauce. Premade or packaged marinades. Premade or packaged taco seasonings. Relishes. Regular salad dressings. Where to find more information:  National Heart, Lung, and [place]: https://wilson-eaton.com/  American Heart Association: www.heart.org Summary  The DASH eating plan is a healthy eating plan that has been shown to reduce high blood pressure (hypertension). It may also reduce your risk for type 2 diabetes, heart disease, and stroke.  With the DASH eating plan, you should limit salt (sodium) intake to 2,300 mg a day. If you have hypertension, you may need to reduce your sodium intake to 1,500 mg a day.  When on the DASH eating plan, aim to eat more fresh fruits and vegetables, whole grains, lean proteins, low-fat dairy, and heart-healthy fats.  Work with your health care  provider or diet and nutrition specialist (dietitian) to adjust your eating plan to your individual calorie needs. This information is not intended to replace advice given to you by your health care provider. Make sure you discuss any questions you have with your health care provider. Document Released: 10/03/2011 Document Revised: 09/26/2017 Document Reviewed: 10/07/2016 Elsevier Patient Education  2020 [employer].

## 2019-09-17 LAB — CMP14+EGFR
ALT: 12 IU/L (ref 0–32)
AST: 17 IU/L (ref 0–40)
Albumin/Globulin Ratio: 1.4 (ref 1.2–2.2)
Albumin: 3.8 g/dL (ref 3.8–4.8)
Alkaline Phosphatase: 72 IU/L (ref 39–117)
BUN/Creatinine Ratio: 17 (ref 12–28)
BUN: 17 mg/dL (ref 8–27)
Bilirubin Total: 0.4 mg/dL (ref 0.0–1.2)
CO2: 25 mmol/L (ref 20–29)
Calcium: 9.2 mg/dL (ref 8.7–10.3)
Chloride: 104 mmol/L (ref 96–106)
Creatinine, Ser: 0.99 mg/dL (ref 0.57–1.00)
GFR calc Af Amer: 68 mL/min/{1.73_m2} (ref >59)
GFR calc non Af Amer: 59 mL/min/{1.73_m2} — ABNORMAL LOW (ref >59)
Globulin, Total: 2.8 g/dL (ref 1.5–4.5)
Glucose: 104 mg/dL — ABNORMAL HIGH (ref 65–99)
Potassium: 3.5 mmol/L (ref 3.5–5.2)
Sodium: 143 mmol/L (ref 134–144)
Total Protein: 6.6 g/dL (ref 6.0–8.5)

## 2019-09-17 LAB — LIPID PANEL
Chol/HDL Ratio: 2.2 ratio (ref 0.0–4.4)
Cholesterol, Total: 182 mg/dL (ref 100–199)
HDL: 81 mg/dL (ref >39)
LDL Chol Calc (NIH): 90 mg/dL (ref 0–99)
Triglycerides: 54 mg/dL (ref 0–149)
VLDL Cholesterol Cal: 11 mg/dL (ref 5–40)

## 2019-10-01 ENCOUNTER — Other Ambulatory Visit: Payer: Self-pay | Admitting: Internal Medicine

## 2019-10-01 DIAGNOSIS — Z1231 Encounter for screening mammogram for malignant neoplasm of breast: Secondary | ICD-10-CM

## 2019-11-03 ENCOUNTER — Ambulatory Visit (INDEPENDENT_AMBULATORY_CARE_PROVIDER_SITE_OTHER): Payer: Medicare Other

## 2019-11-03 ENCOUNTER — Ambulatory Visit (INDEPENDENT_AMBULATORY_CARE_PROVIDER_SITE_OTHER): Payer: Medicare Other | Admitting: Podiatry

## 2019-11-03 ENCOUNTER — Other Ambulatory Visit: Payer: Self-pay

## 2019-11-03 ENCOUNTER — Encounter: Payer: Self-pay | Admitting: Podiatry

## 2019-11-03 VITALS — BP 160/70

## 2019-11-03 DIAGNOSIS — M79672 Pain in left foot: Secondary | ICD-10-CM

## 2019-11-03 DIAGNOSIS — M722 Plantar fascial fibromatosis: Secondary | ICD-10-CM | POA: Diagnosis not present

## 2019-11-03 DIAGNOSIS — M19072 Primary osteoarthritis, left ankle and foot: Secondary | ICD-10-CM | POA: Diagnosis not present

## 2019-11-03 MED ORDER — COLCHICINE 0.6 MG PO TABS: 0.6000 mg | ORAL_TABLET | Freq: Every day | ORAL | 0 refills | Status: DC

## 2019-11-03 NOTE — Patient Instructions (Signed)
Plantar Fasciitis (Heel Spur Syndrome) with Rehab The plantar fascia is a fibrous, ligament-like, soft-tissue structure that spans the bottom of the foot. Plantar fasciitis is a condition that causes pain in the foot due to inflammation of the tissue. SYMPTOMS   Pain and tenderness on the underneath side of the foot.  Pain that worsens with standing or walking. CAUSES  Plantar fasciitis is caused by irritation and injury to the plantar fascia on the underneath side of the foot. Common mechanisms of injury include:  Direct trauma to bottom of the foot.  Damage to a small nerve that runs under the foot where the main fascia attaches to the heel bone.  Stress placed on the plantar fascia due to bone spurs. RISK INCREASES WITH:   Activities that place stress on the plantar fascia (running, jumping, pivoting, or cutting).  Poor strength and flexibility.  Improperly fitted shoes.  Tight calf muscles.  Flat feet.  Failure to warm-up properly before activity.  Obesity. PREVENTION  Warm up and stretch properly before activity.  Allow for adequate recovery between workouts.  Maintain physical fitness:  Strength, flexibility, and endurance.  Cardiovascular fitness.  Maintain a health body weight.  Avoid stress on the plantar fascia.  Wear properly fitted shoes, including arch supports for individuals who have flat feet.  PROGNOSIS  If treated properly, then the symptoms of plantar fasciitis usually resolve without surgery. However, occasionally surgery is necessary.  RELATED COMPLICATIONS   Recurrent symptoms that may result in a chronic condition.  Problems of the lower back that are caused by compensating for the injury, such as limping.  Pain or weakness of the foot during push-off following surgery.  Chronic inflammation, scarring, and partial or complete fascia tear, occurring more often from repeated injections.  TREATMENT  Treatment initially involves the  use of ice and medication to help reduce pain and inflammation. The use of strengthening and stretching exercises may help reduce pain with activity, especially stretches of the Achilles tendon. These exercises may be performed at home or with a therapist. Your caregiver may recommend that you use heel cups of arch supports to help reduce stress on the plantar fascia. Occasionally, corticosteroid injections are given to reduce inflammation. If symptoms persist for greater than 6 months despite non-surgical (conservative), then surgery may be recommended.   MEDICATION   If pain medication is necessary, then nonsteroidal anti-inflammatory medications, such as aspirin and ibuprofen, or other minor pain relievers, such as acetaminophen, are often recommended.  Do not take pain medication within 7 days before surgery.  Prescription pain relievers may be given if deemed necessary by your caregiver. Use only as directed and only as much as you need.  Corticosteroid injections may be given by your caregiver. These injections should be reserved for the most serious cases, because they may only be given a certain number of times.  HEAT AND COLD  Cold treatment (icing) relieves pain and reduces inflammation. Cold treatment should be applied for 10 to 15 minutes every 2 to 3 hours for inflammation and pain and immediately after any activity that aggravates your symptoms. Use ice packs or massage the area with a piece of ice (ice massage).  Heat treatment may be used prior to performing the stretching and strengthening activities prescribed by your caregiver, physical therapist, or athletic trainer. Use a heat pack or soak the injury in warm water.  SEEK IMMEDIATE MEDICAL CARE IF:  Treatment seems to offer no benefit, or the condition worsens.  Any medications   produce adverse side effects.  EXERCISES- RANGE OF MOTION (ROM) AND STRETCHING EXERCISES - Plantar Fasciitis (Heel Spur Syndrome) These exercises  may help you when beginning to rehabilitate your injury. Your symptoms may resolve with or without further involvement from your physician, physical therapist or athletic trainer. While completing these exercises, remember:   Restoring tissue flexibility helps normal motion to return to the joints. This allows healthier, less painful movement and activity.  An effective stretch should be held for at least 30 seconds.  A stretch should never be painful. You should only feel a gentle lengthening or release in the stretched tissue.  RANGE OF MOTION - Toe Extension, Flexion  Sit with your right / left leg crossed over your opposite knee.  Grasp your toes and gently pull them back toward the top of your foot. You should feel a stretch on the bottom of your toes and/or foot.  Hold this stretch for 10 seconds.  Now, gently pull your toes toward the bottom of your foot. You should feel a stretch on the top of your toes and or foot.  Hold this stretch for 10 seconds. Repeat  times. Complete this stretch 3 times per day.   RANGE OF MOTION - Ankle Dorsiflexion, Active Assisted  Remove shoes and sit on a chair that is preferably not on a carpeted surface.  Place right / left foot under knee. Extend your opposite leg for support.  Keeping your heel down, slide your right / left foot back toward the chair until you feel a stretch at your ankle or calf. If you do not feel a stretch, slide your bottom forward to the edge of the chair, while still keeping your heel down.  Hold this stretch for 10 seconds. Repeat 3 times. Complete this stretch 2 times per day.   STRETCH  Gastroc, Standing  Place hands on wall.  Extend right / left leg, keeping the front knee somewhat bent.  Slightly point your toes inward on your back foot.  Keeping your right / left heel on the floor and your knee straight, shift your weight toward the wall, not allowing your back to arch.  You should feel a gentle stretch  in the right / left calf. Hold this position for 10 seconds. Repeat 3 times. Complete this stretch 2 times per day.  STRETCH  Soleus, Standing  Place hands on wall.  Extend right / left leg, keeping the other knee somewhat bent.  Slightly point your toes inward on your back foot.  Keep your right / left heel on the floor, bend your back knee, and slightly shift your weight over the back leg so that you feel a gentle stretch deep in your back calf.  Hold this position for 10 seconds. Repeat 3 times. Complete this stretch 2 times per day.  STRETCH  Gastrocsoleus, Standing  Note: This exercise can place a lot of stress on your foot and ankle. Please complete this exercise only if specifically instructed by your caregiver.   Place the ball of your right / left foot on a step, keeping your other foot firmly on the same step.  Hold on to the wall or a rail for balance.  Slowly lift your other foot, allowing your body weight to press your heel down over the edge of the step.  You should feel a stretch in your right / left calf.  Hold this position for 10 seconds.  Repeat this exercise with a slight bend in your right /   left knee. Repeat 3 times. Complete this stretch 2 times per day.   STRENGTHENING EXERCISES - Plantar Fasciitis (Heel Spur Syndrome)  These exercises may help you when beginning to rehabilitate your injury. They may resolve your symptoms with or without further involvement from your physician, physical therapist or athletic trainer. While completing these exercises, remember:   Muscles can gain both the endurance and the strength needed for everyday activities through controlled exercises.  Complete these exercises as instructed by your physician, physical therapist or athletic trainer. Progress the resistance and repetitions only as guided.  STRENGTH - Towel Curls  Sit in a chair positioned on a non-carpeted surface.  Place your foot on a towel, keeping your heel  on the floor.  Pull the towel toward your heel by only curling your toes. Keep your heel on the floor. Repeat 3 times. Complete this exercise 2 times per day.  STRENGTH - Ankle Inversion  Secure one end of a rubber exercise band/tubing to a fixed object (table, pole). Loop the other end around your foot just before your toes.  Place your fists between your knees. This will focus your strengthening at your ankle.  Slowly, pull your big toe up and in, making sure the band/tubing is positioned to resist the entire motion.  Hold this position for 10 seconds.  Have your muscles resist the band/tubing as it slowly pulls your foot back to the starting position. Repeat 3 times. Complete this exercises 2 times per day.  Document Released: 10/14/2005 Document Revised: 01/06/2012 Document Reviewed: 01/26/2009 [employer] Patient Information 2014 [employer].      Information for patients with Gout  Gout defined-Gout occurs when urate crystals accumulate in your joint causing the inflammation and intense pain of gout attack.  Urate crystals can form when you have high levels of uric acid in your blood.  Your body produces uric acid when it breaks down prurines-substances that are found naturally in your body, as well as in certain foods such as organ meats, anchioves, herring, asparagus, and mushrooms.  Normally uric acid dissolves in your blood and passes through your kidneys into your urine.  But sometimes your body either produces too much uric acid or your kidneys excrete too little uric acid.  When this happens, uric acid can build up, forming sharp needle-like urate crystals in a joint or surrounding tissue that cause pain, inflammation and swelling.    Gout is characterized by sudden, severe attacks of pain, redness and tenderness in joints, often the joint at the base of the big toe.  Gout is complex form of arthritis that can affect anyone.  Men are more likely to get gout but women  become increasingly more susceptible to gout after menopause.  An acute attack of gout can wake you up in the middle of the night with the sensation that your big toe is on fire.  The affected joint is hot, swollen and so tender that even the weight or the sheet on it may seem intolerable.  If you experience symptoms of an acute gout attack it is important to your doctor as soon as the symptoms start.  Gout that goes untreated can lead to worsening pain and joint damage.  Risk Factors:  You are more likely to develop gout if you have high levels of uric acid in your body.    Factors that increase the uric acid level in your body include:  Lifestyle factors.  Excessive alcohol use-generally more than two drinks a day   for men and more than one for women increase the risk of gout.  Medical conditions.  Certain conditions make it more likely that you will develop gout.  These include hypertension, and chronic conditions such as diabetes, high levels of fat and cholesterol in the blood, and narrowing of the arteries.  Certain medications.  The uses of Thiazide diuretics- commonly used to treat hypertension and low dose aspirin can also increase uric acid levels.  Family history of gout.  If other members of your family have had gout, you are more likely to develop the disease.  Age and sex. Gout occurs more often in men than it does in women, primarily because women tend to have lower uric acid levels than men do.  Men are more likely to develop gout earlier usually between the ages of 40-50- whereas women generally develop signs and symptoms after menopause.    Tests and diagnosis:  Tests to help diagnose gout may include:  Blood test.  Your doctor may recommend a blood test to measure the uric acid level in your blood .  Blood tests can be misleading, though.  Some people have high uric acid levels but never experience gout.  And some people have signs and symptoms of gout, but don't have  unusual levels of uric acid in their blood.  Joint fluid test.  Your doctor may use a needle to draw fluid from your affected joint.  When examined under the microscope, your joint fluid may reveal urate crystals.  Treatment:  Treatment for gout usually involves medications.  What medications you and your doctor choose will be based on your current health and other medications you currently take.  Gout medications can be used to treat acute gout attacks and prevent future attacks as well as reduce your risk of complications from gout such as the development of tophi from urate crystal deposits.  Alternative medicine:   Certain foods have been studied for their potential to lower uric acid levels, including:  Coffee.  Studies have found an association between coffee drinking (regular and decaf) and lower uric acid levels.  The evidence is not enough to encourage non-coffee drinkers to start, but it may give clues to new ways of treating gout in the future.  Vitamin C.  Supplements containing vitamin C may reduce the levels of uric acid in your blood.  However, vitamin as a treatment for gout. Don't assume that if a little vitamin C is good, than lots is better.  Megadoses of vitamin C may increase your bodies uric acid levels.  Cherries.  Cherries have been associated with lower levels of uric acid in studies, but it isn't clear if they have any effect on gout signs and symptoms.  Eating more cherries and other dar-colored fruits, such as blackberries, blueberries, purple grapes and raspberries, may be a safe way to support your gout treatment.    Lifestyle/Diet Recommendations:   Drink 8 to 16 cups ( about 2 to 4 liters) of fluid each day, with at least half being water.  Avoid alcohol  Eat a moderate amount of protein, preferably from healthy sources, such as low-fat or fat-free dairy, tofu, eggs, and nut butters.  Limit you daily intake of meat, fish, and poultry to 4 to 6  ounces.  Avoid high fat meats and desserts.  Decrease you intake of shellfish, beef, lamb, pork, eggs and cheese.  Choose a good source of vitamin C daily such as citrus fruits, strawberries, broccoli,  brussel sprouts,   papaya, and cantaloupe.   Choose a good source of vitamin A every other day such as yellow fruits, or dark green/yellow vegetables.  Avoid drastic weigh reduction or fasting.  If weigh loss is desired lose it over a period of several months.  See "dietary considerations.." chart for specific food recommendations.  Dietary Considerations for people with Gout  Food with negligible purine content (0-15 mg of purine nitrogen per 100 grams food)  May use as desired except on calorie variations  Non fat milk Cocoa Cereals (except in list II) Hard candies  Buttermilk Carbonated drinks Vegetables (except in list II) Sherbet  Coffee Fruits Sugar Honey  Tea Cottage Cheese Gelatin-jell-o Salt  Fruit juice Breads Angel food Cake   Herbs/spices Jams/Jellies Carnation Instant Breakfast    Foods that do not contain excessive purine content, but must be limited due to fat content  Cream Eggs Oil and Salad Dressing  Half and Half Peanut Butter Chocolate  Whole Milk Cakes Potato Chips  Butter Ice Cream Fried Foods  Cheese Nuts Waffles, pancakes   List II: Food with moderate purine content (50-150 mg of purine nitrogen per 100 grams of food)  Limit total amount each day to 5 oz. cooked Lean meat, other than those on list III   Poultry, other than those on list III Fish, other than those on list III   Seafood, other than those on list III  These foods may be used occasionally  Peas Lentils Bran  Spinach Oatmeal Dried Beans and Peas  Asparagus Wheat Germ Mushrooms   Additional information about meat choices  Choose fish and poultry, particularly without skin, often.  Select lean, well trimmed cuts of meat.  Avoid all fatty meats, bacon , sausage, fried meats, fried fish, or  poultry, luncheon meats, cold cuts, hot dogs, meats canned or frozen in gravy, spareribs and frozen and packaged prepared meats.   List III: Foods with HIGH purine content / Foods to AVOID (150-800 mg of purine nitrogen per 100 grams of food)  Anchovies Herring Meat Broths  Liver Mackerel Meat Extracts  Kidney Scallops Meat Drippings  Sardines Wild Game Mincemeat  Sweetbreads Goose Gravy  Heart Tongue Yeast, baker's and brewers   Commercial soups made with any of the foods listed in List II or List III  In addition avoid all alcoholic beverages 

## 2019-11-04 ENCOUNTER — Encounter: Payer: Self-pay | Admitting: Podiatry

## 2019-11-04 NOTE — Progress Notes (Signed)
Subjective:  Patient ID: Kathryn Mendez, female    DOB: [birth year],  MRN: [id number]  Chief Complaint  Patient presents with  . Foot Pain    pt is here for left foot pain located on the medial side, going on since sunday, pain is elevated to the touch, pt also states that she has a hard time wearing shoes    69 y.o. female presents with the above complaint.  Patient presents with pain to the heel as well as pain to the first big toe.  On the left side.  She states that the pain came on since all of a sudden started last Sunday has been causing her a lot of pain.  She states the left heel pain has been present for a couple of months now and has progressively gotten worse.  She states it hurts when she is getting out of bed and taking the first day.  She has not tried anything to seek any treatment for the heel pain.  She states that the first metatarsophalangeal joint pain started last Sunday and has progressively gotten worse.  Shooting achy there is redness associated with it with a little bit of swelling.  She states that painful when wearing shoes.  She does not have any history of gout's.  She states that she had slightly increase in diet with red meat over the last week due to the holidays.  She denies any other acute complaints.   Review of Systems: Negative except as noted in the HPI. Denies N/V/F/Ch.  Past Medical History:  Diagnosis Date  . High cholesterol   . Hypertension     Current Outpatient Medications:  .  amLODipine (NORVASC) 5 MG tablet, Take 1 tablet (5 mg total) by mouth daily., Disp: 90 tablet, Rfl: 1 .  aspirin 81 MG tablet, Take 81 mg by mouth daily., Disp: , Rfl:  .  cyclobenzaprine (FLEXERIL) 10 MG tablet, Take 1 tablet (10 mg total) by mouth 3 (three) times daily as needed for muscle spasms., Disp: 30 tablet, Rfl: 0 .  ergocalciferol (VITAMIN D2) 1.25 MG (50000 UT) capsule, ergocalciferol (vitamin D2) 1,250 mcg (50,000 unit) capsule  TAKE ONE CAPSULE BY MOUTH  ON TUESDAYS AND FRIDAYS, Disp: , Rfl:  .  hydrochlorothiazide (HYDRODIURIL) 25 MG tablet, Take 1 tablet (25 mg total) by mouth daily., Disp: 90 tablet, Rfl: 1 .  hydrocortisone (ANUSOL-HC) 2.5 % rectal cream, Apply rectally 2 times daily, Disp: 30 g, Rfl: 1 .  metoprolol succinate (TOPROL-XL) 100 MG 24 hr tablet, Take 1 tablet (100 mg total) by mouth daily. Take with or immediately following a meal., Disp: 90 tablet, Rfl: 1 .  rosuvastatin (CRESTOR) 5 MG tablet, Take 1 tablet (5 mg total) by mouth daily., Disp: 90 tablet, Rfl: 1 .  colchicine 0.6 MG tablet, Take 1 tablet (0.6 mg total) by mouth daily., Disp: 30 tablet, Rfl: 0  Social History   Tobacco Use  Smoking Status Never Smoker  Smokeless Tobacco Never Used    Allergies  Allergen Reactions  . Other     Food allergies   Objective:   Vitals:   11/03/19 0933  BP: (!) 160/70   There is no height or weight on file to calculate BMI. Constitutional Well developed. Well nourished.  Vascular Dorsalis pedis pulses palpable bilaterally. Posterior tibial pulses palpable bilaterally. Capillary refill normal to all digits.  No cyanosis or clubbing noted. Pedal hair growth normal.  Neurologic Normal speech. Oriented to person, place, and time. Epicritic  sensation to light touch grossly present bilaterally.  Dermatologic Nails well groomed and normal in appearance. No open wounds. No skin lesions.  Orthopedic: Normal joint ROM without pain or crepitus bilaterally. No visible deformities. Tender to palpation at the calcaneal tuber left. No pain with calcaneal squeeze left. Ankle ROM full range of motion left. Silfverskiold Test: negative left.   Radiographs: Taken and reviewed. No acute fractures or dislocations. No evidence of stress fracture.  Plantar heel spur absent. Posterior heel spur absent.  There is decrease in first metatarsal joint space nature.  There is a mild dorsal spur.  These findings are consistent with mild  arthritic changes to the first metatarsophalangeal joint.  There is an increasing mid medial eminence/hypertrophy.  Sesamoid position is any good alignment.  Assessment:   1. Foot pain, left    Plan:  Patient was evaluated and treated and all questions answered.  Plantar Fasciitis, left - XR reviewed as above.  - Educated on icing and stretching. Instructions given.  - Injection delivered to the plantar fascia as below. 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  Patient denies any history of gout.  This may be an isolated event however I educated the patient that if this continues to happen patient will need to follow-up with a rheumatologist for long-term management of gout. -I also believe that patient will benefit from a steroid injection to help decrease the acute inflammation of the joint. -Colchicine was also sent to the pharmacy to help decrease the inflammatory component. -A steroid injection was performed at left first metatarsophalangeal joint using 1% plain Lidocaine and 10 mg of Kenalog. This was well tolerated.   Procedure: Injection Tendon/Ligament Location: Left plantar fascia at the glabrous junction; medial approach. Skin Prep: alcohol Injectate: 0.5 cc 0.5% marcaine plain, 0.5 cc of 1% Lidocaine, 0.5 cc kenalog 10. Disposition: Patient tolerated procedure well. Injection site dressed with a band-aid.  Return in about 4 weeks (around 12/01/2019) for Plantar fasciitis.

## 2019-11-05 ENCOUNTER — Other Ambulatory Visit: Payer: Self-pay | Admitting: Podiatry

## 2019-11-05 DIAGNOSIS — M722 Plantar fascial fibromatosis: Secondary | ICD-10-CM

## 2019-11-11 DIAGNOSIS — H2513 Age-related nuclear cataract, bilateral: Secondary | ICD-10-CM | POA: Diagnosis not present

## 2019-11-16 ENCOUNTER — Other Ambulatory Visit: Payer: Self-pay

## 2019-11-16 ENCOUNTER — Ambulatory Visit
Admission: RE | Admit: 2019-11-16 | Discharge: 2019-11-16 | Disposition: A | Discharge status: Home / Self Care | Payer: Medicare Other | Source: Ambulatory Visit | Attending: Internal Medicine | Admitting: Internal Medicine

## 2019-11-16 DIAGNOSIS — Z1231 Encounter for screening mammogram for malignant neoplasm of breast: Secondary | ICD-10-CM | POA: Diagnosis not present

## 2019-12-01 ENCOUNTER — Ambulatory Visit (INDEPENDENT_AMBULATORY_CARE_PROVIDER_SITE_OTHER): Payer: Medicare Other | Admitting: Podiatry

## 2019-12-01 ENCOUNTER — Other Ambulatory Visit: Payer: Self-pay

## 2019-12-01 DIAGNOSIS — M722 Plantar fascial fibromatosis: Secondary | ICD-10-CM

## 2019-12-01 DIAGNOSIS — M79672 Pain in left foot: Secondary | ICD-10-CM | POA: Diagnosis not present

## 2019-12-01 DIAGNOSIS — M19072 Primary osteoarthritis, left ankle and foot: Secondary | ICD-10-CM | POA: Diagnosis not present

## 2019-12-02 ENCOUNTER — Encounter: Payer: Self-pay | Admitting: Podiatry

## 2019-12-02 NOTE — Progress Notes (Signed)
Subjective:  Patient ID: Kathryn Mendez, female    DOB: 1951-04-14,  MRN: [id number]  No chief complaint on file.   69 y.o. female presents with the above complaint.  Patient is following up for left plantar fasciitis as well as first metatarsophalangeal joint injections.  Patient states her pain has completely resolved.  She does not have any more pain.  She has been doing her stretching exercises.  She also has been wearing her surgical shoe.  Her pain scale is 2 out of 10.  She has been wearing her ankle brace which has considerably helped.  She denies any other acute complaints.  She also does not have any pain at the first metatarsophalangeal joint with range of motion.   Review of Systems: Negative except as noted in the HPI. Denies N/V/F/Ch.  Past Medical History:  Diagnosis Date  . High cholesterol   . Hypertension     Current Outpatient Medications:  .  amLODipine (NORVASC) 5 MG tablet, Take 1 tablet (5 mg total) by mouth daily., Disp: 90 tablet, Rfl: 1 .  aspirin 81 MG tablet, Take 81 mg by mouth daily., Disp: , Rfl:  .  colchicine 0.6 MG tablet, Take 1 tablet (0.6 mg total) by mouth daily., Disp: 30 tablet, Rfl: 0 .  cyclobenzaprine (FLEXERIL) 10 MG tablet, Take 1 tablet (10 mg total) by mouth 3 (three) times daily as needed for muscle spasms., Disp: 30 tablet, Rfl: 0 .  ergocalciferol (VITAMIN D2) 1.25 MG (50000 UT) capsule, ergocalciferol (vitamin D2) 1,250 mcg (50,000 unit) capsule  TAKE ONE CAPSULE BY MOUTH ON TUESDAYS AND FRIDAYS, Disp: , Rfl:  .  hydrochlorothiazide (HYDRODIURIL) 25 MG tablet, Take 1 tablet (25 mg total) by mouth daily., Disp: 90 tablet, Rfl: 1 .  metoprolol succinate (TOPROL-XL) 100 MG 24 hr tablet, Take 1 tablet (100 mg total) by mouth daily. Take with or immediately following a meal., Disp: 90 tablet, Rfl: 1 .  rosuvastatin (CRESTOR) 5 MG tablet, Take 1 tablet (5 mg total) by mouth daily., Disp: 90 tablet, Rfl: 1  Social History   Tobacco Use    Smoking Status Never Smoker  Smokeless Tobacco Never Used    Allergies  Allergen Reactions  . Other     Food allergies   Objective:   There were no vitals filed for this visit. There is no height or weight on file to calculate BMI. Constitutional Well developed. Well nourished.  Vascular Dorsalis pedis pulses palpable bilaterally. Posterior tibial pulses palpable bilaterally. Capillary refill normal to all digits.  No cyanosis or clubbing noted. Pedal hair growth normal.  Neurologic Normal speech. Oriented to person, place, and time. Epicritic sensation to light touch grossly present bilaterally.  Dermatologic Nails well groomed and normal in appearance. No open wounds. No skin lesions.  Orthopedic: Normal joint ROM without pain or crepitus bilaterally. No visible deformities. No tender to palpation at the calcaneal tuber left. No pain with calcaneal squeeze left. Ankle ROM full range of motion left. Silfverskiold Test: negative left.   Radiographs: None  Assessment:   1. Foot pain, left   2. Arthritis of first metatarsophalangeal (MTP) joint of left foot   3. Plantar fasciitis of left foot    Plan:  Patient was evaluated and treated and all questions answered.  Plantar Fasciitis, left Resolved.  I have asked the patient that long-term management of plantar fasciitis is continuing to do stretching exercises I spoke to her little bit about orthotics and good shoe gear modifications.  I believe patient will benefit from a night splint to help keep the foot and the plantar fascia at 90 degrees and stretch to and therefore decreasing the pain from post static dyskinesia. -Night splint was dispensed. -I have asked the patient to come back and see me if her pain returns and she is not able to control it at home.  For now patient is discharged from my care.  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  No further pain  noted with clinical examination    No follow-ups on file.

## 2019-12-21 DIAGNOSIS — M25562 Pain in left knee: Secondary | ICD-10-CM | POA: Diagnosis not present

## 2019-12-22 ENCOUNTER — Other Ambulatory Visit: Payer: Self-pay

## 2019-12-22 ENCOUNTER — Ambulatory Visit (INDEPENDENT_AMBULATORY_CARE_PROVIDER_SITE_OTHER): Payer: Medicare Other | Admitting: Internal Medicine

## 2019-12-22 ENCOUNTER — Encounter: Payer: Self-pay | Admitting: Internal Medicine

## 2019-12-22 VITALS — BP 122/86 | HR 61 | Temp 97.8°F | Ht 62.2 in | Wt 171.6 lb

## 2019-12-22 DIAGNOSIS — Z6831 Body mass index (BMI) 31.0-31.9, adult: Secondary | ICD-10-CM

## 2019-12-22 DIAGNOSIS — E6609 Other obesity due to excess calories: Secondary | ICD-10-CM

## 2019-12-22 DIAGNOSIS — I129 Hypertensive chronic kidney disease with stage 1 through stage 4 chronic kidney disease, or unspecified chronic kidney disease: Secondary | ICD-10-CM | POA: Diagnosis not present

## 2019-12-22 DIAGNOSIS — R0982 Postnasal drip: Secondary | ICD-10-CM | POA: Diagnosis not present

## 2019-12-22 DIAGNOSIS — N182 Chronic kidney disease, stage 2 (mild): Secondary | ICD-10-CM

## 2019-12-22 MED ORDER — AMLODIPINE BESYLATE 5 MG PO TABS: 5.0000 mg | ORAL_TABLET | Freq: Every day | ORAL | 1 refills | Status: DC

## 2019-12-22 MED ORDER — HYDROCHLOROTHIAZIDE 25 MG PO TABS: 25.0000 mg | ORAL_TABLET | Freq: Every day | ORAL | 1 refills | Status: DC

## 2019-12-22 MED ORDER — ROSUVASTATIN CALCIUM 5 MG PO TABS: 5.0000 mg | ORAL_TABLET | Freq: Every day | ORAL | 1 refills | Status: DC

## 2019-12-22 MED ORDER — METOPROLOL SUCCINATE ER 100 MG PO TB24: 100.0000 mg | ORAL_TABLET | Freq: Every day | ORAL | 1 refills | Status: DC

## 2019-12-22 NOTE — Progress Notes (Signed)
This visit occurred during the SARS-CoV-2 public health emergency.  Safety protocols were in place, including screening questions prior to the visit, additional usage of staff PPE, and extensive cleaning of exam room while observing appropriate contact time as indicated for disinfecting solutions.  Subjective:     Patient ID: Kathryn Mendez , female    DOB: 16-May-1951 , 69 y.o.   MRN: [id number]   Chief Complaint  Patient presents with  . Hypertension    HPI  She presents today for BP check. She reports compliance with meds. Today, she is concerned about her allergies.   Hypertension This is a chronic problem. The current episode started more than 1 year ago. The problem has been gradually improving since onset. The problem is controlled. Pertinent negatives include no blurred vision, chest pain, palpitations or shortness of breath. Risk factors for coronary artery disease include dyslipidemia, post-menopausal state, sedentary lifestyle and obesity. The current treatment provides moderate improvement. Hypertensive end-organ damage includes kidney disease.     Past Medical History:  Diagnosis Date  . High cholesterol   . Hypertension      Family History  Problem Relation Age of Onset  . Diabetes Mother   . Hypertension Mother   . Diabetes Sister      Current Outpatient Medications:  .  amLODipine (NORVASC) 5 MG tablet, Take 1 tablet (5 mg total) by mouth daily., Disp: 90 tablet, Rfl: 1 .  aspirin 81 MG tablet, Take 81 mg by mouth daily., Disp: , Rfl:  .  ergocalciferol (VITAMIN D2) 1.25 MG (50000 UT) capsule, ergocalciferol (vitamin D2) 1,250 mcg (50,000 unit) capsule  TAKE ONE CAPSULE BY MOUTH ON TUESDAYS AND FRIDAYS, Disp: , Rfl:  .  hydrochlorothiazide (HYDRODIURIL) 25 MG tablet, Take 1 tablet (25 mg total) by mouth daily., Disp: 90 tablet, Rfl: 1 .  metoprolol succinate (TOPROL-XL) 100 MG 24 hr tablet, Take 1 tablet (100 mg total) by mouth daily. Take with or  immediately following a meal., Disp: 90 tablet, Rfl: 1 .  rosuvastatin (CRESTOR) 5 MG tablet, Take 1 tablet (5 mg total) by mouth daily., Disp: 90 tablet, Rfl: 1   Allergies  Allergen Reactions  . Other     Food allergies     Review of Systems  Constitutional: Negative.   HENT: Positive for postnasal drip.   Eyes: Negative for blurred vision.  Respiratory: Negative.  Negative for shortness of breath.   Cardiovascular: Negative.  Negative for chest pain and palpitations.  Gastrointestinal: Negative.   Neurological: Negative.   Psychiatric/Behavioral: Negative.      Today's Vitals   12/22/19 0938  BP: 122/86  Pulse: 61  Temp: 97.8 F (36.6 C)  TempSrc: Oral  Weight: 171 lb 9.6 oz (77.8 kg)  Height: 5' 2.2" (1.58 m)  PainSc: 0-No pain   Body mass index is 31.18 kg/m.   Objective:  Physical Exam Vitals and nursing note reviewed.  Constitutional:      Appearance: Normal appearance.  HENT:     Head: Normocephalic and atraumatic.  Cardiovascular:     Rate and Rhythm: Normal rate and regular rhythm.     Heart sounds: Normal heart sounds.  Pulmonary:     Effort: Pulmonary effort is normal.     Breath sounds: Normal breath sounds.  Skin:    General: Skin is warm.  Neurological:     General: No focal deficit present.     Mental Status: She is alert.  Psychiatric:  Mood and Affect: Mood normal.        Behavior: Behavior normal.         Assessment And Plan:     1. Hypertensive nephropathy  Chronic, well controlled. She is encouraged to avoid adding salt to her foods.  She is encouraged to incorporate more exercise into her daily routine.   2. Chronic renal disease, stage II  Chronic. I reviewed her most recent BMP today. I will not check renal function until her next visit.   3. Postnasal drip  She is advised to cut back on her dairy intake. Also encouraged to try saline flush. She will let me know if her sx persist.   4. Class 1 obesity due to excess  calories with serious comorbidity and body mass index (BMI) of 31.0 to 31.9 in adult  She is encouraged to strive for BMI less than 29 to decrease cardiac risk. She is encouraged to exercise no less than four to five days per week for at least 30 minutes.    [name], MD    THE PATIENT IS ENCOURAGED TO PRACTICE SOCIAL DISTANCING DUE TO THE COVID-19 PANDEMIC.

## 2019-12-22 NOTE — Patient Instructions (Signed)
Exercising to Stay Healthy To become healthy and stay healthy, it is recommended that you do moderate-intensity and vigorous-intensity exercise. You can tell that you are exercising at a moderate intensity if your heart starts beating faster and you start breathing faster but can still hold a conversation. You can tell that you are exercising at a vigorous intensity if you are breathing much harder and faster and cannot hold a conversation while exercising. Exercising regularly is important. It has many health benefits, such as:  Improving overall fitness, flexibility, and endurance.  Increasing bone density.  Helping with weight control.  Decreasing body fat.  Increasing muscle strength.  Reducing stress and tension.  Improving overall health. How often should I exercise? Choose an activity that you enjoy, and set realistic goals. Your health care provider can help you make an activity plan that works for you. Exercise regularly as told by your health care provider. This may include:  Doing strength training two times a week, such as: ? Lifting weights. ? Using resistance bands. ? Push-ups. ? Sit-ups. ? Yoga.  Doing a certain intensity of exercise for a given amount of time. Choose from these options: ? A total of 150 minutes of moderate-intensity exercise every week. ? A total of 75 minutes of vigorous-intensity exercise every week. ? A mix of moderate-intensity and vigorous-intensity exercise every week. Children, pregnant women, people who have not exercised regularly, people who are overweight, and older adults may need to talk with a health care provider about what activities are safe to do. If you have a medical condition, be sure to talk with your health care provider before you start a new exercise program. What are some exercise ideas? Moderate-intensity exercise ideas include:  Walking 1 mile (1.6 km) in about 15  minutes.  Biking.  Hiking.  Golfing.  Dancing.  Water aerobics. Vigorous-intensity exercise ideas include:  Walking 4.5 miles (7.2 km) or more in about 1 hour.  Jogging or running 5 miles (8 km) in about 1 hour.  Biking 10 miles (16.1 km) or more in about 1 hour.  Lap swimming.  Roller-skating or in-line skating.  Cross-country skiing.  Vigorous competitive sports, such as football, basketball, and soccer.  Jumping rope.  Aerobic dancing. What are some everyday activities that can help me to get exercise?  Yard work, such as: ? Pushing a lawn mower. ? Raking and bagging leaves.  Washing your car.  Pushing a stroller.  Shoveling snow.  Gardening.  Washing windows or floors. How can I be more active in my day-to-day activities?  Use stairs instead of an elevator.  Take a walk during your lunch break.  If you drive, park your car farther away from your work or school.  If you take public transportation, get off one stop early and walk the rest of the way.  Stand up or walk around during all of your indoor phone calls.  Get up, stretch, and walk around every 30 minutes throughout the day.  Enjoy exercise with a friend. Support to continue exercising will help you keep a regular routine of activity. What guidelines can I follow while exercising?  Before you start a new exercise program, talk with your health care provider.  Do not exercise so much that you hurt yourself, feel dizzy, or get very short of breath.  Wear comfortable clothes and wear shoes with good support.  Drink plenty of water while you exercise to prevent dehydration or heat stroke.  Work out until your breathing   and your heartbeat get faster. Where to find more information  [place] Department of Health and Human Services: www.hhs.gov  Centers for Disease Control and Prevention (CDC): www.cdc.gov Summary  Exercising regularly is important. It will improve your overall fitness,  flexibility, and endurance.  Regular exercise also will improve your overall health. It can help you control your weight, reduce stress, and improve your bone density.  Do not exercise so much that you hurt yourself, feel dizzy, or get very short of breath.  Before you start a new exercise program, talk with your health care provider. This information is not intended to replace advice given to you by your health care provider. Make sure you discuss any questions you have with your health care provider. Document Revised: 09/26/2017 Document Reviewed: 09/04/2017 Elsevier Patient Education  2020 [employer]  

## 2019-12-24 DIAGNOSIS — Z23 Encounter for immunization: Secondary | ICD-10-CM | POA: Diagnosis not present

## 2020-01-05 ENCOUNTER — Other Ambulatory Visit: Payer: Self-pay

## 2020-01-05 MED ORDER — ROSUVASTATIN CALCIUM 5 MG PO TABS: 5.0000 mg | ORAL_TABLET | Freq: Every day | ORAL | 1 refills | Status: DC

## 2020-01-21 DIAGNOSIS — Z23 Encounter for immunization: Secondary | ICD-10-CM | POA: Diagnosis not present

## 2020-02-01 ENCOUNTER — Ambulatory Visit (INDEPENDENT_AMBULATORY_CARE_PROVIDER_SITE_OTHER): Payer: Medicare Other | Admitting: Nurse Practitioner

## 2020-02-01 ENCOUNTER — Encounter: Payer: Self-pay | Admitting: Nurse Practitioner

## 2020-02-01 ENCOUNTER — Other Ambulatory Visit: Payer: Self-pay

## 2020-02-01 VITALS — BP 114/76 | HR 73 | Temp 98.4°F | Ht 63.2 in | Wt 172.0 lb

## 2020-02-01 DIAGNOSIS — M79672 Pain in left foot: Secondary | ICD-10-CM | POA: Diagnosis not present

## 2020-02-01 MED ORDER — PREDNISONE 10 MG (21) PO TBPK: ORAL_TABLET | ORAL | 0 refills | Status: DC

## 2020-02-01 MED ORDER — TRIAMCINOLONE ACETONIDE 40 MG/ML IJ SUSP
60.0000 mg | Freq: Once | INTRAMUSCULAR | Status: AC
  Administered 2020-02-01: 60 mg via INTRAMUSCULAR

## 2020-02-01 NOTE — Progress Notes (Signed)
This visit occurred during the SARS-CoV-2 public health emergency.  Safety protocols were in place, including screening questions prior to the visit, additional usage of staff PPE, and extensive cleaning of exam room while observing appropriate contact time as indicated for disinfecting solutions.  Subjective:     Patient ID: Kathryn Mendez , female    DOB: [birth year] , 69 y.o.   MRN: [id number]   Chief Complaint  Patient presents with  . Gout    patient stated she is having a gout flare in her big toe     HPI  Left foot pain started last week. She had fish and 2 hot dogs last week. Began in her knee and is now down to her foot.  She has been using an ice pack. Sheets cause pain to her great toe. Denies fever, but seems like her foot has been warm.  Last time this occurred was 3 months ago     Past Medical History:  Diagnosis Date  . High cholesterol   . Hypertension      Family History  Problem Relation Age of Onset  . Diabetes Mother   . Hypertension Mother   . Diabetes Sister      Current Outpatient Medications:  .  amLODipine (NORVASC) 5 MG tablet, Take 1 tablet (5 mg total) by mouth daily., Disp: 90 tablet, Rfl: 1 .  aspirin 81 MG tablet, Take 81 mg by mouth daily., Disp: , Rfl:  .  ergocalciferol (VITAMIN D2) 1.25 MG (50000 UT) capsule, ergocalciferol (vitamin D2) 1,250 mcg (50,000 unit) capsule  TAKE ONE CAPSULE BY MOUTH ON TUESDAYS AND FRIDAYS, Disp: , Rfl:  .  hydrochlorothiazide (HYDRODIURIL) 25 MG tablet, Take 1 tablet (25 mg total) by mouth daily., Disp: 90 tablet, Rfl: 1 .  metoprolol succinate (TOPROL-XL) 100 MG 24 hr tablet, Take 1 tablet (100 mg total) by mouth daily. Take with or immediately following a meal., Disp: 90 tablet, Rfl: 1 .  rosuvastatin (CRESTOR) 5 MG tablet, Take 1 tablet (5 mg total) by mouth daily., Disp: 90 tablet, Rfl: 1   Allergies  Allergen Reactions  . Other     Food allergies     Review of Systems  Constitutional: Negative.    Respiratory: Negative.   Cardiovascular: Negative.  Negative for chest pain, palpitations and leg swelling.  Musculoskeletal:       Left foot swelling and pain to medial great toe.      Today's Vitals   02/01/20 1201  BP: 114/76  Pulse: 73  Temp: 98.4 F (36.9 C)  TempSrc: Oral  Weight: 172 lb (78 kg)  Height: 5' 3.2" (1.605 m)  PainSc: 9   PainLoc: Toe   Body mass index is 30.28 kg/m.   Objective:  Physical Exam Constitutional:      General: She is not in acute distress.    Appearance: Normal appearance.  Cardiovascular:     Rate and Rhythm: Normal rate and regular rhythm.     Pulses: Normal pulses.     Heart sounds: Normal heart sounds. No murmur.  Pulmonary:     Effort: Pulmonary effort is normal. No respiratory distress.     Breath sounds: Normal breath sounds.  Musculoskeletal:        General: Swelling (left foot) and tenderness (left medial great toe and foot, hot to touch) present.  Neurological:     Mental Status: She is alert.  Psychiatric:        Mood and Affect: Mood normal.  Behavior: Behavior normal.        Thought Content: Thought content normal.        Judgment: Judgment normal.         Assessment And Plan:     1. Left foot pain  Likely related to a gout exacerbation will check uric acid  Kenalog injection given in office and she is to start dose pack tomorrow  If uric acid elevated consider allopurinol daily or can send colchicine  The nature of gout is fully explained, including dietary relationship, acute and interval phase and treatment of both. Long term complications such as kidney stones, tophi and arthritis are discussed. Avoidance of alcohol recommended, and written literature is given along with a low purine diet. Indications for the use of allopurinol for prophylaxis and the use of colchicine to prevent or treat flare-ups is also discussed. Proper use of indomethacin for acute attacks discussed, and its side effects. Call if  further attacks occur, or this one does not resolve promptly. - triamcinolone acetonide (KENALOG-40) injection 60 mg - Uric acid - predniSONE (STERAPRED UNI-PAK 21 TAB) 10 MG (21) TBPK tablet; Take as directed  Dispense: 21 tablet; Refill: 0    Minette Brine, FNP    THE PATIENT IS ENCOURAGED TO PRACTICE SOCIAL DISTANCING DUE TO THE COVID-19 PANDEMIC.

## 2020-02-02 LAB — URIC ACID: Uric Acid: 8.3 mg/dL — ABNORMAL HIGH (ref 3.0–7.2)

## 2020-02-10 ENCOUNTER — Other Ambulatory Visit: Payer: Self-pay | Admitting: Nurse Practitioner

## 2020-02-10 DIAGNOSIS — M109 Gout, unspecified: Secondary | ICD-10-CM

## 2020-02-10 MED ORDER — COLCHICINE 0.6 MG PO TABS: ORAL_TABLET | ORAL | 2 refills | Status: DC

## 2020-02-13 IMAGING — MG DIGITAL SCREENING BILATERAL MAMMOGRAM WITH TOMO AND CAD
7 series · 8 of 19 positions shown · non-contrast
Comparison: Previous exam(s).

CLINICAL DATA: Screening.

EXAM:
DIGITAL SCREENING BILATERAL MAMMOGRAM WITH TOMO AND CAD

[R CC synth-2D]
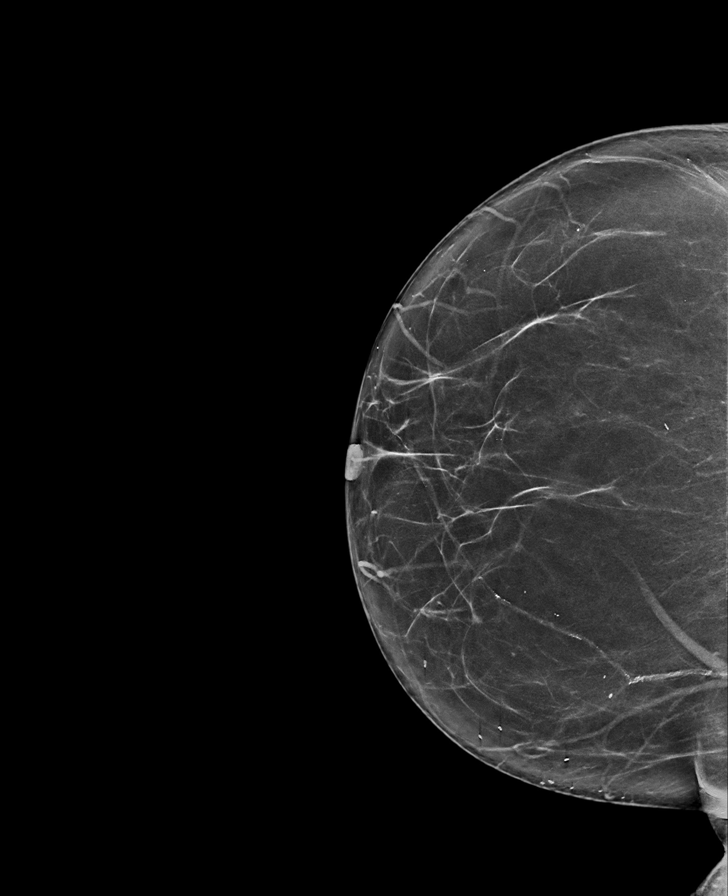

[L CC synth-2D]
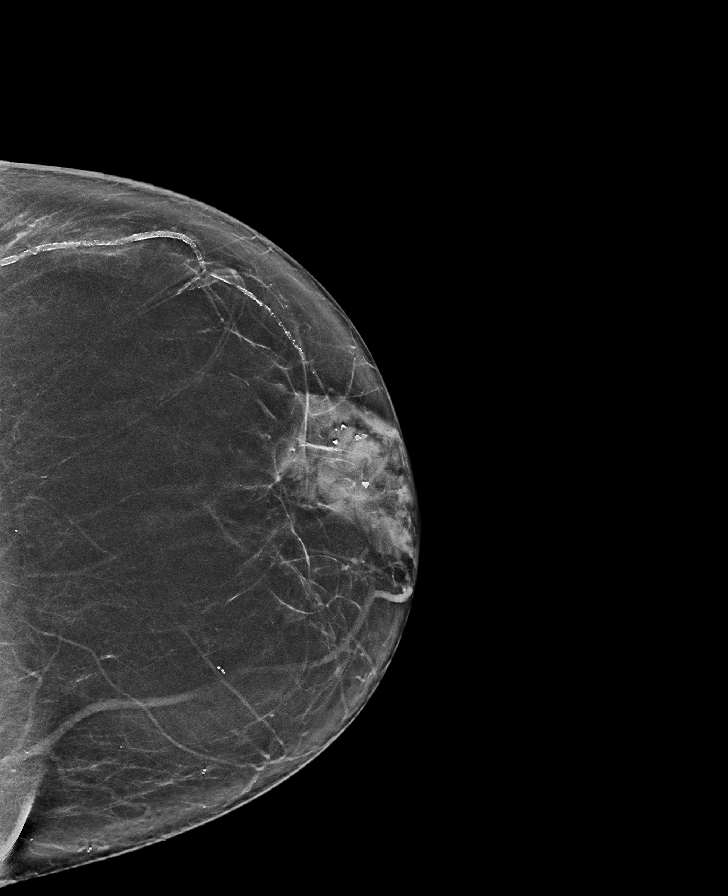

[L MLO synth-2D]
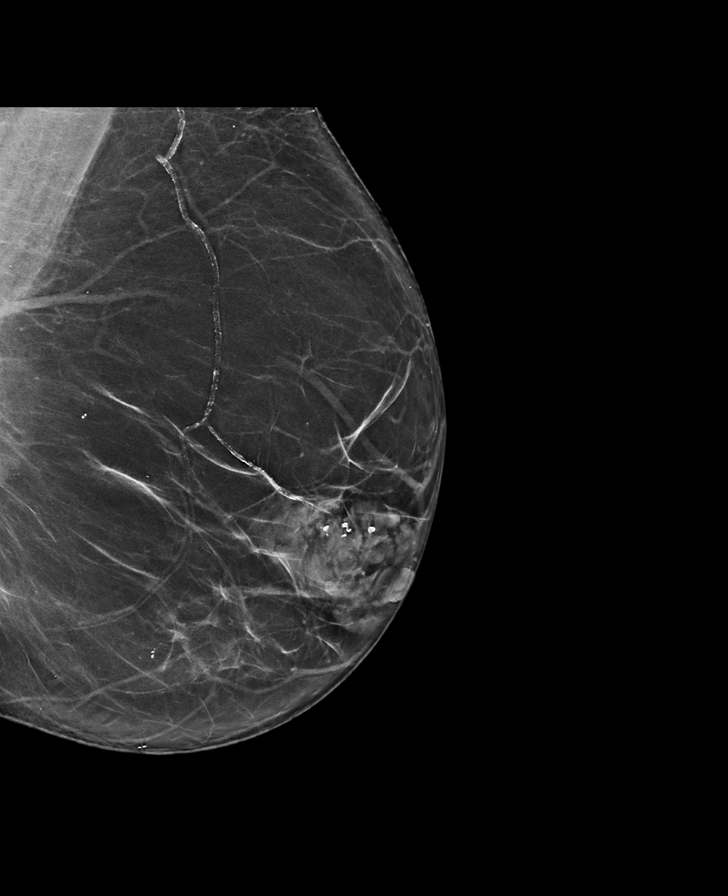

[R MLO synth-2D]
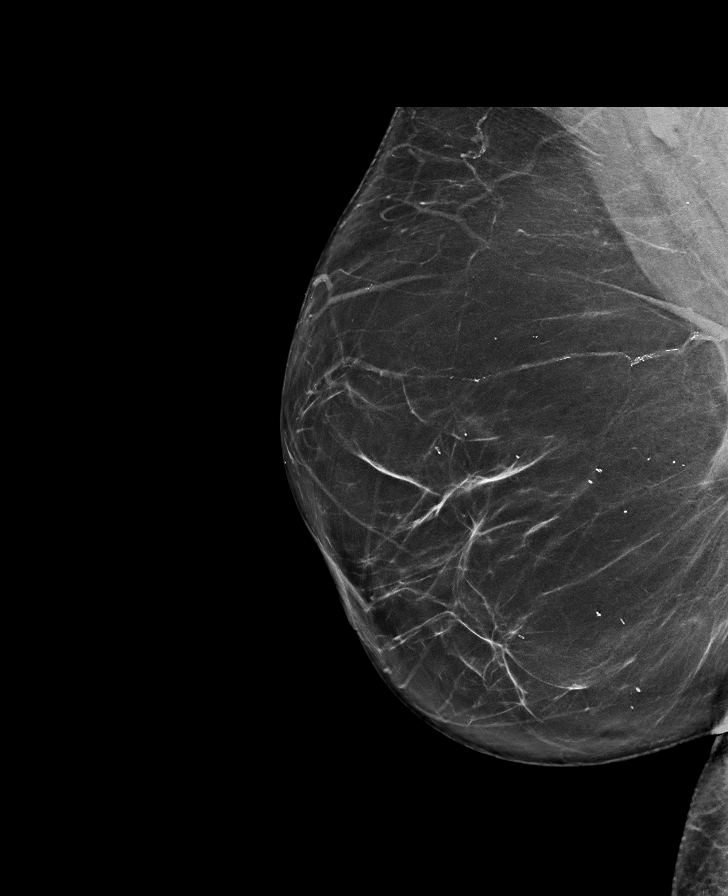

[R MLO tomo · 2 of 87 frames shown]
[frame 29/87]
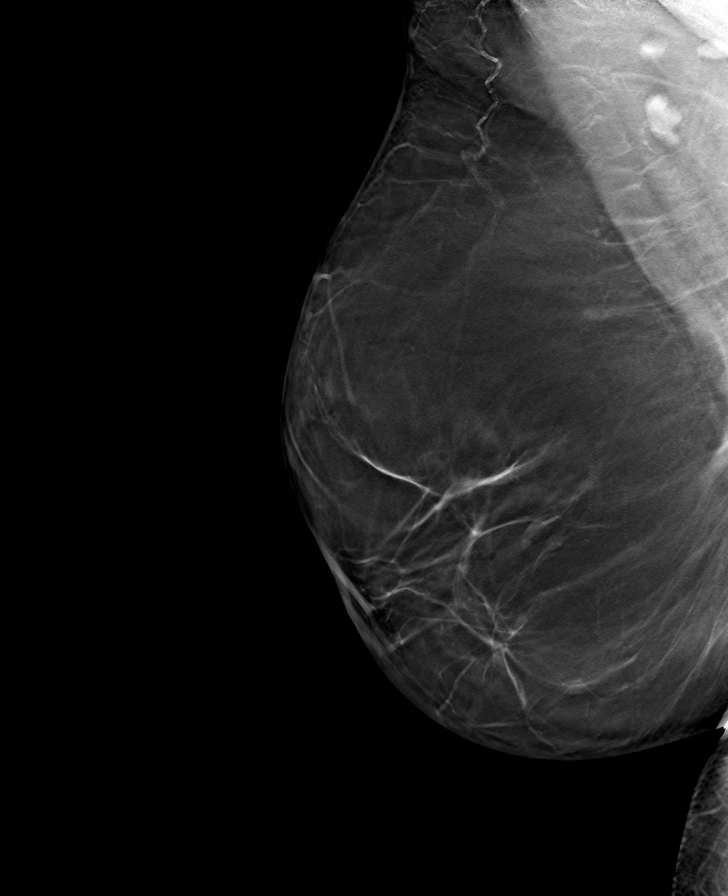
[frame 44/87]
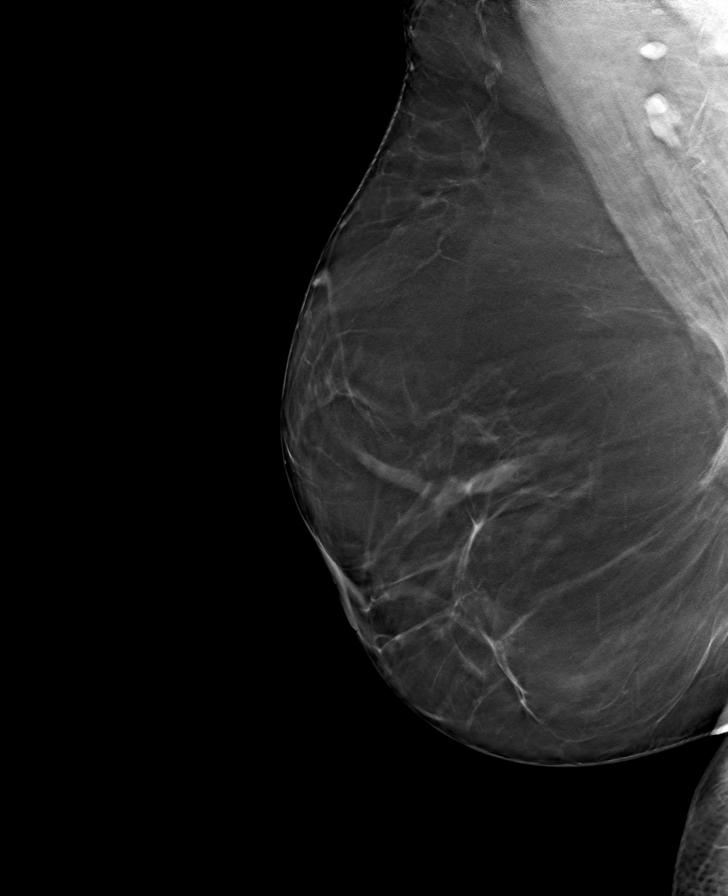

[R CC tomo · tomo slice 40/79.0]
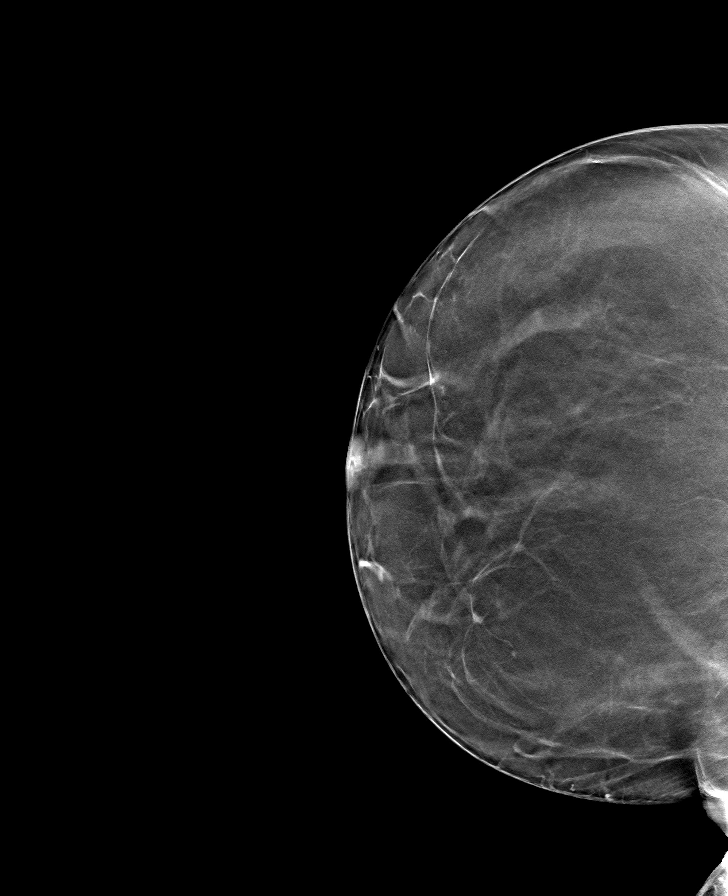

[L MLO tomo · tomo slice 43/86.0]
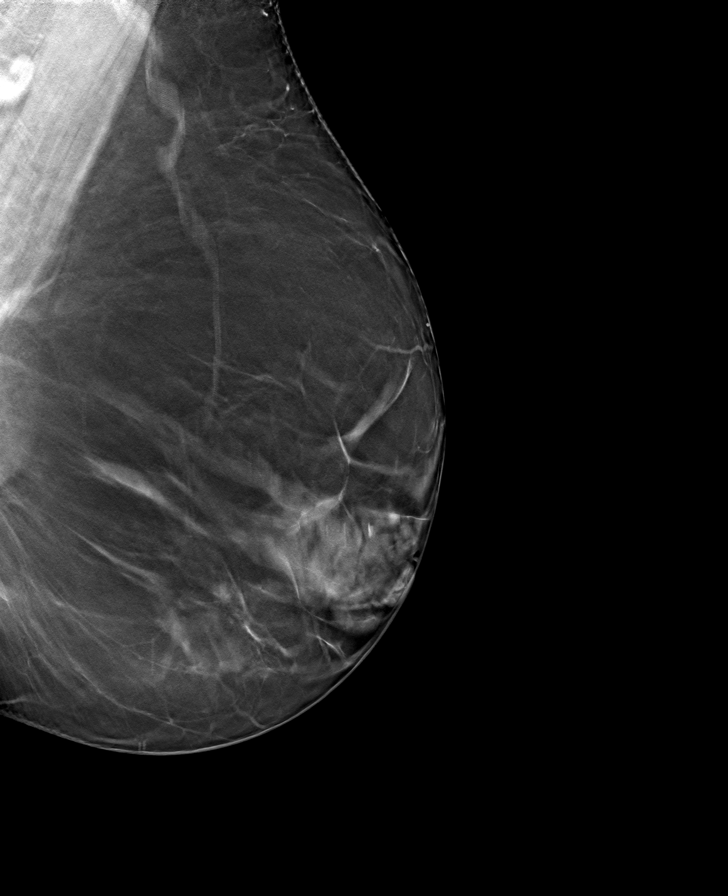

[8 of 19 positions shown; findings below may reference images not displayed]

ACR Breast Density Category b: There are scattered areas of
fibroglandular density.
FINDINGS: There are no findings suspicious for malignancy. Images were
processed with CAD.
IMPRESSION: No mammographic evidence of malignancy. A result letter of this
screening mammogram will be mailed directly to the patient.

RECOMMENDATION:
Screening mammogram in one year. (Code:[id number])

BI-RADS CATEGORY  1: Negative.

## 2020-02-15 ENCOUNTER — Other Ambulatory Visit: Payer: Self-pay

## 2020-02-15 MED ORDER — MITIGARE 0.6 MG PO CAPS: ORAL_CAPSULE | ORAL | 0 refills | Status: DC

## 2020-02-15 NOTE — Progress Notes (Signed)
M

## 2020-02-28 ENCOUNTER — Ambulatory Visit: Payer: Medicare Other | Admitting: Nurse Practitioner

## 2020-03-15 ENCOUNTER — Ambulatory Visit (INDEPENDENT_AMBULATORY_CARE_PROVIDER_SITE_OTHER): Payer: Medicare Other | Admitting: Internal Medicine

## 2020-03-15 ENCOUNTER — Encounter: Payer: Self-pay | Admitting: Internal Medicine

## 2020-03-15 VITALS — BP 124/82 | HR 66 | Temp 97.6°F | Ht 63.2 in | Wt 168.6 lb

## 2020-03-15 DIAGNOSIS — E663 Overweight: Secondary | ICD-10-CM | POA: Diagnosis not present

## 2020-03-15 DIAGNOSIS — I129 Hypertensive chronic kidney disease with stage 1 through stage 4 chronic kidney disease, or unspecified chronic kidney disease: Secondary | ICD-10-CM

## 2020-03-15 DIAGNOSIS — F4321 Adjustment disorder with depressed mood: Secondary | ICD-10-CM | POA: Diagnosis not present

## 2020-03-15 DIAGNOSIS — Z6829 Body mass index (BMI) 29.0-29.9, adult: Secondary | ICD-10-CM

## 2020-03-15 DIAGNOSIS — N182 Chronic kidney disease, stage 2 (mild): Secondary | ICD-10-CM | POA: Diagnosis not present

## 2020-03-15 DIAGNOSIS — M1A2720 Drug-induced chronic gout, left ankle and foot, without tophus (tophi): Secondary | ICD-10-CM

## 2020-03-15 NOTE — Progress Notes (Signed)
This visit occurred during the SARS-CoV-2 public health emergency.  Safety protocols were in place, including screening questions prior to the visit, additional usage of staff PPE, and extensive cleaning of exam room while observing appropriate contact time as indicated for disinfecting solutions.  Subjective:     Patient ID: Kathryn Mendez , female    DOB: [birth year] , 69 y.o.   MRN: [id number]   Chief Complaint  Patient presents with  . Hypertension  . Gout    HPI  She is here today for BP check and f/u gout. She states she was seen for gout in her foot/knee last month with nurse practitioner. She was given rx colchicine to take for 2-3 days prn, and her sx resolved. Since then, her left knee is starting to bother her again. There is some pain with ambulation. She is not sure what may have triggered her sx. She admits to being under stress. Unfortunately, since her last visit her husband passed after battling cancer and her brother died unexpectedly after hernia surgery.   Hypertension This is a chronic problem. The current episode started more than 1 year ago. The problem has been gradually improving since onset. The problem is controlled. Pertinent negatives include no blurred vision, chest pain, palpitations or shortness of breath. Risk factors for coronary artery disease include dyslipidemia, post-menopausal state, sedentary lifestyle and obesity. The current treatment provides moderate improvement. Hypertensive end-organ damage includes kidney disease.     Past Medical History:  Diagnosis Date  . High cholesterol   . Hypertension      Family History  Problem Relation Age of Onset  . Diabetes Mother   . Hypertension Mother   . Diabetes Sister      Current Outpatient Medications:  .  amLODipine (NORVASC) 5 MG tablet, Take 1 tablet (5 mg total) by mouth daily., Disp: 90 tablet, Rfl: 1 .  aspirin 81 MG tablet, Take 81 mg by mouth daily., Disp: , Rfl:  .  colchicine 0.6  MG tablet, Take 1 tablet two times a day x 3 days with exacerbations, Disp: 30 tablet, Rfl: 2 .  ergocalciferol (VITAMIN D2) 1.25 MG (50000 UT) capsule, ergocalciferol (vitamin D2) 1,250 mcg (50,000 unit) capsule  TAKE ONE CAPSULE BY MOUTH ON TUESDAYS AND FRIDAYS, Disp: , Rfl:  .  hydrochlorothiazide (HYDRODIURIL) 25 MG tablet, Take 1 tablet (25 mg total) by mouth daily., Disp: 90 tablet, Rfl: 1 .  metoprolol succinate (TOPROL-XL) 100 MG 24 hr tablet, Take 1 tablet (100 mg total) by mouth daily. Take with or immediately following a meal., Disp: 90 tablet, Rfl: 1 .  rosuvastatin (CRESTOR) 5 MG tablet, Take 1 tablet (5 mg total) by mouth daily., Disp: 90 tablet, Rfl: 1   Allergies  Allergen Reactions  . Other     Food allergies     Review of Systems  Constitutional: Negative.   Eyes: Negative for blurred vision.  Respiratory: Negative.  Negative for shortness of breath.   Cardiovascular: Negative.  Negative for chest pain and palpitations.  Gastrointestinal: Negative.   Neurological: Negative.   Psychiatric/Behavioral: Negative.      Today's Vitals   03/15/20 1120  BP: 124/82  Pulse: 66  Temp: 97.6 F (36.4 C)  TempSrc: Oral  Weight: 168 lb 9.6 oz (76.5 kg)  Height: 5' 3.2" (1.605 m)  PainSc: 0-No pain   Body mass index is 29.68 kg/m.   Objective:  Physical Exam Vitals and nursing note reviewed.  Constitutional:  Appearance: Normal appearance.  HENT:     Head: Normocephalic and atraumatic.  Cardiovascular:     Rate and Rhythm: Normal rate and regular rhythm.     Heart sounds: Normal heart sounds.  Pulmonary:     Effort: Pulmonary effort is normal.     Breath sounds: Normal breath sounds.  Skin:    General: Skin is warm.  Neurological:     General: No focal deficit present.     Mental Status: She is alert.  Psychiatric:        Mood and Affect: Mood normal.        Behavior: Behavior normal.         Assessment And Plan:     1. Hypertensive  nephropathy  Chronic, well controlled. She will continue with current meds. She is encouraged to avoid adding salt to her foods. I will check bmp today. She will rto in six months for her next evaluation.   - BMP8+EGFR  2. Chronic renal disease, stage II  Chronic, this has been stable. I will check renal function today. She is encouraged to stay well hydrated and to keep BP well controlled to prevent progression of CKD.   3. Grief  She declines grief counseling at this time. She agrees to let me know if she changes her mind.   4. Overweight with body mass index (BMI) of 29 to 29.9 in adult  Her weight is stable for her demographic. She is still encouraged to incorporate more exercise into her daily routine.   5. 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 Pt advised that HCTZ may be contributing to her gout symptoms. I will consider adding allopurinol to her current regimen after reviewing her renal function and uric acid levels. She will also resume use of colchicine. All questions were answered to her satisfaction.   - Uric acid   [name], MD    THE PATIENT IS ENCOURAGED TO PRACTICE SOCIAL DISTANCING DUE TO THE COVID-19 PANDEMIC.

## 2020-03-15 NOTE — Patient Instructions (Signed)
Managing Loss, Adult People experience loss in many different ways throughout their lives. Events such as moving, changing jobs, and losing friends can create a sense of loss. The loss may be as serious as a major health change, divorce, death of a pet, or death of a loved one. All of these types of loss are likely to create a physical and emotional reaction known as grief. Grief is the result of a major change or an absence of something or someone that you count on. Grief is a normal reaction to loss. A variety of factors can affect your grieving experience, including:  The nature of your loss.  Your relationship to what or whom you lost.  Your understanding of grief and how to manage it.  Your support system. How to manage lifestyle changes Keep to your normal routine as much as possible.  If you have trouble focusing or doing normal activities, it is acceptable to take some time away from your normal routine.  Spend time with friends and loved ones.  Eat a healthy diet, get plenty of sleep, and rest when you feel tired. How to recognize changes  The way that you deal with your grief will affect your ability to function as you normally do. When grieving, you may experience these changes:  Numbness, shock, sadness, anxiety, anger, denial, and guilt.  Thoughts about death.  Unexpected crying.  A physical sensation of emptiness in your stomach.  Problems sleeping and eating.  Tiredness (fatigue).  Loss of interest in normal activities.  Dreaming about or imagining seeing the person who died.  A need to remember what or whom you lost.  Difficulty thinking about anything other than your loss for a period of time.  Relief. If you have been expecting the loss for a while, you may feel a sense of relief when it happens. Follow these instructions at home:  Activity Express your feelings in healthy ways, such as:  Talking with others about your loss. It may be helpful to find  others who have had a similar loss, such as a support group.  Writing down your feelings in a journal.  Doing physical activities to release stress and emotional energy.  Doing creative activities like painting, sculpting, or playing or listening to music.  Practicing resilience. This is the ability to recover and adjust after facing challenges. Reading some resources that encourage resilience may help you to learn ways to practice those behaviors. General instructions  Be patient with yourself and others. Allow the grieving process to happen, and remember that grieving takes time. ? It is likely that you may never feel completely done with some grief. You may find a way to move on while still cherishing memories and feelings about your loss. ? Accepting your loss is a process. It can take months or longer to adjust.  Keep all follow-up visits as told by your health care provider. This is important. Where to find support To get support for managing loss:  Ask your health care provider for help and recommendations, such as grief counseling or therapy.  Think about joining a support group for people who are managing a loss. Where to find more information You can find more information about managing loss from:  American Society of Clinical Oncology: www.cancer.net  American Psychological Association: www.apa.org Contact a health care provider if:  Your grief is extreme and keeps getting worse.  You have ongoing grief that does not improve.  Your body shows symptoms of grief, such   as illness.  You feel depressed, anxious, or lonely. Get help right away if:  You have thoughts about hurting yourself or others. If you ever feel like you may hurt yourself or others, or have thoughts about taking your own life, get help right away. You can go to your nearest emergency department or call:  Your local emergency services (911 in the [place]  A suicide crisis helpline, such as the  National Suicide Prevention Lifeline at 1-800-273-8255. This is open 24 hours a day. Summary  Grief is the result of a major change or an absence of someone or something that you count on. Grief is a normal reaction to loss.  The depth of grief and the period of recovery depend on the type of loss and your ability to adjust to the change and process your feelings.  Processing grief requires patience and a willingness to accept your feelings and talk about your loss with people who are supportive.  It is important to find resources that work for you and to realize that people experience grief differently. There is not one grieving process that works for everyone in the same way.  Be aware that when grief becomes extreme, it can lead to more severe issues like isolation, depression, anxiety, or suicidal thoughts. Talk with your health care provider if you have any of these issues. This information is not intended to replace advice given to you by your health care provider. Make sure you discuss any questions you have with your health care provider. Document Revised: 12/18/2018 Document Reviewed: 02/27/2017 Elsevier Patient Education  2020 Elsevier Inc.  

## 2020-03-16 LAB — BMP8+EGFR
BUN/Creatinine Ratio: 13 (ref 12–28)
BUN: 11 mg/dL (ref 8–27)
CO2: 27 mmol/L (ref 20–29)
Calcium: 9.3 mg/dL (ref 8.7–10.3)
Chloride: 105 mmol/L (ref 96–106)
Creatinine, Ser: 0.87 mg/dL (ref 0.57–1.00)
GFR calc Af Amer: 79 mL/min/{1.73_m2} (ref >59)
GFR calc non Af Amer: 69 mL/min/{1.73_m2} (ref >59)
Glucose: 96 mg/dL (ref 65–99)
Potassium: 3.4 mmol/L — ABNORMAL LOW (ref 3.5–5.2)
Sodium: 144 mmol/L (ref 134–144)

## 2020-03-16 LAB — URIC ACID: Uric Acid: 7 mg/dL (ref 3.0–7.2)

## 2020-03-20 ENCOUNTER — Other Ambulatory Visit: Payer: Self-pay | Admitting: Internal Medicine

## 2020-03-20 ENCOUNTER — Other Ambulatory Visit: Payer: Self-pay

## 2020-03-20 MED ORDER — ALLOPURINOL 100 MG PO TABS: 100.0000 mg | ORAL_TABLET | Freq: Every day | ORAL | 1 refills | Status: DC

## 2020-03-23 ENCOUNTER — Telehealth: Payer: Self-pay

## 2020-03-23 ENCOUNTER — Other Ambulatory Visit: Payer: Self-pay

## 2020-03-23 MED ORDER — PRAVASTATIN SODIUM 40 MG PO TABS: 40.0000 mg | ORAL_TABLET | Freq: Every evening | ORAL | 1 refills | Status: DC

## 2020-03-23 NOTE — Telephone Encounter (Signed)
PT SWITCH FROM ROSUVASTATIN 5 TO PRAVASTATIN 40MG  DUE TO PRICE AT PHARMACY

## 2020-04-24 ENCOUNTER — Encounter: Payer: Self-pay | Admitting: Internal Medicine

## 2020-04-24 ENCOUNTER — Ambulatory Visit (INDEPENDENT_AMBULATORY_CARE_PROVIDER_SITE_OTHER): Payer: Medicare Other | Admitting: Internal Medicine

## 2020-04-24 ENCOUNTER — Other Ambulatory Visit: Payer: Self-pay

## 2020-04-24 VITALS — BP 156/84 | HR 76 | Temp 97.7°F | Ht 63.2 in | Wt 170.2 lb

## 2020-04-24 DIAGNOSIS — R600 Localized edema: Secondary | ICD-10-CM | POA: Diagnosis not present

## 2020-04-24 DIAGNOSIS — M1A2720 Drug-induced chronic gout, left ankle and foot, without tophus (tophi): Secondary | ICD-10-CM | POA: Diagnosis not present

## 2020-04-24 DIAGNOSIS — N182 Chronic kidney disease, stage 2 (mild): Secondary | ICD-10-CM

## 2020-04-24 DIAGNOSIS — I129 Hypertensive chronic kidney disease with stage 1 through stage 4 chronic kidney disease, or unspecified chronic kidney disease: Secondary | ICD-10-CM | POA: Diagnosis not present

## 2020-04-24 NOTE — Patient Instructions (Addendum)
Voltaren gel - apply to front/back/sides of left knee two to three times daily   Baker Cyst  A Baker cyst, also called a popliteal cyst, is a growth that forms at the back of the knee. The cyst forms when the fluid-filled sac (bursa) that cushions the knee joint becomes enlarged. What are the causes? In most cases, a Baker cyst results from another knee problem that causes swelling inside the knee. This makes the fluid inside the knee joint (synovial fluid) flow into the bursa behind the knee, causing the bursa to enlarge. What increases the risk? You may be more likely to develop a Baker cyst if you already have a knee problem, such as:  A tear in cartilage that cushions the knee joint (meniscal tear).  A tear in the tissues that connect the bones of the knee joint (ligament tear).  Knee swelling from osteoarthritis, rheumatoid arthritis, or gout. What are the signs or symptoms? The main symptom of this condition is a lump behind the knee. This may be the only symptom of the condition. The lump may be painful, especially when the knee is straightened. If the lump is painful, the pain may come and go. The knee may also be stiff. Symptoms may quickly get more severe if the cyst breaks open (ruptures). If the cyst ruptures, you may feel the following in your knee and calf:  Sudden or worsening pain.  Swelling.  Bruising.  Redness in the calf. A Baker cyst does not always cause symptoms. How is this diagnosed? This condition may be diagnosed based on your symptoms and medical history. Your health care provider will also do a physical exam. This may include:  Feeling the cyst to check whether it is tender.  Checking your knee for signs of another knee condition that causes swelling. You may have imaging tests, such as:  X-rays.  MRI.  Ultrasound. How is this treated? A Baker cyst that is not painful may go away without treatment. If the cyst gets large or painful, it will  likely get better if the underlying knee problem is treated. If needed, treatment for a Baker cyst may include:  Resting.  Keeping weight off of the knee. This means not leaning on the knee to support your body weight.  Taking NSAIDs, such as ibuprofen, to reduce pain and swelling.  Having a procedure to drain the fluid from the cyst with a needle (aspiration). You may also get an injection of a medicine that reduces swelling (steroid).  Having surgery. This may be needed if other treatments do not work. This usually involves correcting knee damage and removing the cyst. Follow these instructions at home:  Activity  Rest as told by your health care provider.  Avoid activities that make pain or swelling worse.  Return to your normal activities as told by your health care provider. Ask your health care provider what activities are safe for you.  Do not use the injured limb to support your body weight until your health care provider says that you can. Use crutches as told by your health care provider. General instructions  Take over-the-counter and prescription medicines only as told by your health care provider.  Keep all follow-up visits as told by your health care provider. This is important. Contact a health care provider if:  You have knee pain, stiffness, or swelling that does not get better. Get help right away if:  You have sudden or worsening pain and swelling in your calf area. Summary  A Baker cyst, also called a popliteal cyst, is a growth that forms at the back of the knee.  In most cases, a Baker cyst results from another knee problem that causes swelling inside the knee.  A Baker cyst that is not painful may go away without treatment.  If needed, treatment for a Baker cyst may include resting, keeping weight off of the knee, medicines, or draining fluid from the cyst.  Surgery may be needed if other treatments are not effective. This information is not  intended to replace advice given to you by your health care provider. Make sure you discuss any questions you have with your health care provider. Document Revised: 02/26/2019 Document Reviewed: 02/26/2019 Elsevier Patient Education  [place].

## 2020-04-24 NOTE — Progress Notes (Signed)
This visit occurred during the SARS-CoV-2 public health emergency.  Safety protocols were in place, including screening questions prior to the visit, additional usage of staff PPE, and extensive cleaning of exam room while observing appropriate contact time as indicated for disinfecting solutions.  Subjective:     Patient ID: Kathryn Mendez , female    DOB: 1951/10/09 , 69 y.o.   MRN: [id number]   Chief Complaint  Patient presents with  . Gout    HPI  She is here today for f/u gout. She was started on allopurinol, 100mg  once daily at her last visit. However, she stopped this two weeks ago b/c she was swollen from her toes to her thighs. Her sx improved once she stopped the medication.   Today, she is concerned b/c her left calf is much larger than her right calf. She denies recent long trips - by car or any other means of transportation. Denies calf pain. Denies fall/trauma. She does have pain behind her left knee.    Past Medical History:  Diagnosis Date  . High cholesterol   . Hypertension      Family History  Problem Relation Age of Onset  . Diabetes Mother   . Hypertension Mother   . Diabetes Sister      Current Outpatient Medications:  .  amLODipine (NORVASC) 5 MG tablet, Take 1 tablet (5 mg total) by mouth daily., Disp: 90 tablet, Rfl: 1 .  aspirin 81 MG tablet, Take 81 mg by mouth daily., Disp: , Rfl:  .  colchicine 0.6 MG tablet, Take 1 tablet two times a day x 3 days with exacerbations, Disp: 30 tablet, Rfl: 2 .  ergocalciferol (VITAMIN D2) 1.25 MG (50000 UT) capsule, ergocalciferol (vitamin D2) 1,250 mcg (50,000 unit) capsule  TAKE ONE CAPSULE BY MOUTH ON TUESDAYS AND FRIDAYS, Disp: , Rfl:  .  hydrochlorothiazide (HYDRODIURIL) 25 MG tablet, Take 1 tablet (25 mg total) by mouth daily., Disp: 90 tablet, Rfl: 1 .  metoprolol succinate (TOPROL-XL) 100 MG 24 hr tablet, Take 1 tablet (100 mg total) by mouth daily. Take with or immediately following a meal., Disp: 90  tablet, Rfl: 1 .  pravastatin (PRAVACHOL) 40 MG tablet, Take 1 tablet (40 mg total) by mouth every evening., Disp: 90 tablet, Rfl: 1 .  allopurinol (ZYLOPRIM) 100 MG tablet, TAKE 1 TABLET(100 MG) BY MOUTH DAILY (Patient not taking: Reported on 04/24/2020), Disp: 90 tablet, Rfl: 0   Allergies  Allergen Reactions  . Other     Food allergies     Review of Systems  Constitutional: Negative.   Respiratory: Negative.   Cardiovascular: Negative.   Gastrointestinal: Negative.   Neurological: Negative.   Psychiatric/Behavioral: Negative.      Today's Vitals   04/24/20 1111  BP: (!) 156/84  Pulse: 76  Temp: 97.7 F (36.5 C)  TempSrc: Oral  Weight: 170 lb 3.2 oz (77.2 kg)  Height: 5' 3.2" (1.605 m)  PainSc: 0-No pain   Body mass index is 29.96 kg/m.   Objective:  Physical Exam Vitals and nursing note reviewed.  Constitutional:      Appearance: Normal appearance.  HENT:     Head: Normocephalic and atraumatic.  Cardiovascular:     Rate and Rhythm: Normal rate and regular rhythm.     Heart sounds: Normal heart sounds.  Pulmonary:     Effort: Pulmonary effort is normal.     Breath sounds: Normal breath sounds.  Musculoskeletal:     Comments: R calf is 13.5mg   inches, left calf is 15 inches. There is no erythema of left calf. No tenderness to palpation. There is discomfort to palpation of left popliteal fossa.   Skin:    General: Skin is warm.  Neurological:     General: No focal deficit present.     Mental Status: She is alert.  Psychiatric:        Mood and Affect: Mood normal.        Behavior: Behavior normal.         Assessment And Plan:     1. 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 She stopped allopurinol. She may tolerate Uloric. Will consider Rheum referral.   2. Localized edema  She agrees to left lower extremity venous doppler to r/o DVT. 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 She will continue with ASA  therapy at this time.   - VAS [place] LOWER EXTREMITY VENOUS (DVT); Future  3. Hypertensive nephropathy  Chronic, uncontrolled. Likely exacerbated by her discomfort. She agrees to rto next week for a nurse visit. If needed, I will add another medication to her current regimen at that time.   4. Chronic renal disease, stage II  Chronic.   [name], MD    THE PATIENT IS ENCOURAGED TO PRACTICE SOCIAL DISTANCING DUE TO THE COVID-19 PANDEMIC.

## 2020-04-25 ENCOUNTER — Ambulatory Visit (HOSPITAL_COMMUNITY)
Admission: RE | Admit: 2020-04-25 | Discharge: 2020-04-25 | Disposition: A | Discharge status: Home / Self Care | Payer: Medicare Other | Source: Ambulatory Visit | Attending: Cardiovascular Disease | Admitting: Cardiovascular Disease

## 2020-04-25 DIAGNOSIS — R600 Localized edema: Secondary | ICD-10-CM | POA: Diagnosis present

## 2020-05-03 ENCOUNTER — Other Ambulatory Visit: Payer: Self-pay | Admitting: Internal Medicine

## 2020-05-03 ENCOUNTER — Other Ambulatory Visit: Payer: Self-pay

## 2020-05-03 DIAGNOSIS — M7120 Synovial cyst of popliteal space [Baker], unspecified knee: Secondary | ICD-10-CM

## 2020-05-09 ENCOUNTER — Other Ambulatory Visit: Payer: Self-pay

## 2020-05-09 ENCOUNTER — Ambulatory Visit: Payer: Medicare Other

## 2020-05-09 VITALS — BP 140/80 | HR 68 | Temp 98.1°F | Ht 62.6 in | Wt 170.4 lb

## 2020-05-09 DIAGNOSIS — I10 Essential (primary) hypertension: Secondary | ICD-10-CM

## 2020-05-09 NOTE — Progress Notes (Signed)
Patient is here for a blood pressure check. Pt should take amlodipine and hctz in the morning and motoprolol at night. Pt encouraged to drink water and exercise. Pt will return in 8 weeks

## 2020-05-22 DIAGNOSIS — M1712 Unilateral primary osteoarthritis, left knee: Secondary | ICD-10-CM | POA: Diagnosis not present

## 2020-05-22 DIAGNOSIS — M25562 Pain in left knee: Secondary | ICD-10-CM | POA: Diagnosis not present

## 2020-05-30 ENCOUNTER — Other Ambulatory Visit: Payer: Self-pay | Admitting: Internal Medicine

## 2020-06-16 DIAGNOSIS — M6281 Muscle weakness (generalized): Secondary | ICD-10-CM | POA: Diagnosis not present

## 2020-06-16 DIAGNOSIS — M25562 Pain in left knee: Secondary | ICD-10-CM | POA: Diagnosis not present

## 2020-06-16 DIAGNOSIS — R262 Difficulty in walking, not elsewhere classified: Secondary | ICD-10-CM | POA: Diagnosis not present

## 2020-06-16 DIAGNOSIS — R2689 Other abnormalities of gait and mobility: Secondary | ICD-10-CM | POA: Diagnosis not present

## 2020-06-17 ENCOUNTER — Other Ambulatory Visit: Payer: Self-pay | Admitting: Internal Medicine

## 2020-06-20 DIAGNOSIS — M1712 Unilateral primary osteoarthritis, left knee: Secondary | ICD-10-CM | POA: Diagnosis not present

## 2020-06-26 DIAGNOSIS — R2689 Other abnormalities of gait and mobility: Secondary | ICD-10-CM | POA: Diagnosis not present

## 2020-06-26 DIAGNOSIS — R262 Difficulty in walking, not elsewhere classified: Secondary | ICD-10-CM | POA: Diagnosis not present

## 2020-06-26 DIAGNOSIS — M6281 Muscle weakness (generalized): Secondary | ICD-10-CM | POA: Diagnosis not present

## 2020-06-26 DIAGNOSIS — M25562 Pain in left knee: Secondary | ICD-10-CM | POA: Diagnosis not present

## 2020-06-27 ENCOUNTER — Ambulatory Visit: Payer: Medicare Other

## 2020-06-27 VITALS — BP 118/76 | HR 66 | Temp 98.0°F

## 2020-06-27 DIAGNOSIS — Z20822 Contact with and (suspected) exposure to covid-19: Secondary | ICD-10-CM

## 2020-06-27 DIAGNOSIS — M1712 Unilateral primary osteoarthritis, left knee: Secondary | ICD-10-CM | POA: Diagnosis not present

## 2020-06-27 NOTE — Progress Notes (Signed)
Pt presents today for COVID testing

## 2020-06-29 DIAGNOSIS — R2689 Other abnormalities of gait and mobility: Secondary | ICD-10-CM | POA: Diagnosis not present

## 2020-06-29 DIAGNOSIS — M6281 Muscle weakness (generalized): Secondary | ICD-10-CM | POA: Diagnosis not present

## 2020-06-29 DIAGNOSIS — M25562 Pain in left knee: Secondary | ICD-10-CM | POA: Diagnosis not present

## 2020-06-29 DIAGNOSIS — R262 Difficulty in walking, not elsewhere classified: Secondary | ICD-10-CM | POA: Diagnosis not present

## 2020-06-29 LAB — NOVEL CORONAVIRUS, NAA

## 2020-06-30 ENCOUNTER — Ambulatory Visit (HOSPITAL_COMMUNITY)
Admission: EM | Admit: 2020-06-30 | Discharge: 2020-06-30 | Disposition: A | Discharge status: Home / Self Care | Payer: Medicare Other | Attending: Family Medicine | Admitting: Family Medicine

## 2020-06-30 ENCOUNTER — Encounter (HOSPITAL_COMMUNITY): Payer: Self-pay

## 2020-06-30 ENCOUNTER — Other Ambulatory Visit: Payer: Self-pay

## 2020-06-30 ENCOUNTER — Other Ambulatory Visit: Payer: Self-pay | Admitting: Family Medicine

## 2020-06-30 DIAGNOSIS — J3089 Other allergic rhinitis: Secondary | ICD-10-CM | POA: Diagnosis not present

## 2020-06-30 MED ORDER — PREDNISONE 50 MG PO TABS
ORAL_TABLET | ORAL | 0 refills | Status: DC
Start: 2020-06-30 — End: 2020-07-11

## 2020-06-30 MED ORDER — MONTELUKAST SODIUM 10 MG PO TABS
10.0000 mg | ORAL_TABLET | Freq: Every day | ORAL | 0 refills | Status: DC
Start: 2020-06-30 — End: 2020-06-30

## 2020-06-30 MED ORDER — FLUTICASONE PROPIONATE 50 MCG/ACT NA SUSP
1.0000 | Freq: Two times a day (BID) | NASAL | 0 refills | Status: DC
Start: 2020-06-30 — End: 2020-06-30

## 2020-06-30 NOTE — Telephone Encounter (Signed)
   Notes to clinic:  Patient requests 90 days supply   Requested Prescriptions  Pending Prescriptions Disp Refills   fluticasone (FLONASE) 50 MCG/ACT nasal spray [Pharmacy Med Name: FLUTICASONE 50MCG NASAL SP (120) RX] 48 g     Sig: SHAKE LIQUID AND USE 1 SPRAY IN EACH NOSTRIL TWICE DAILY      Ear, Nose, and Throat: Nasal Preparations - Corticosteroids Failed - 06/30/2020  1:08 PM      Failed - Valid encounter within last 12 months    Recent Outpatient Visits   None     Future Appointments             In 1 week Glendale Chard, MD [facility]   In 2 months Glendale Chard, MD [facility]

## 2020-06-30 NOTE — ED Provider Notes (Signed)
[name]    CSN: [id number] Arrival date & time: 06/30/20  0930      History   Chief Complaint Chief Complaint  Patient presents with  . Nasal Congestion    HPI Kathryn Mendez is a 69 y.o. female.   About a month of worsening eye drainage, rhinorrhea and congestion, post nasal drainage, sore throat, and hacking cough worst in the morning. Not taking anything for this. Has tried OTC allegra in the past without much relief so stopped trying it. Notes she's dealt with seasonal allergies for year and this feels consistent. Denies fever, chills, CP, SOB, wheezing, N/V/D, sick contacts, recent travel.      Past Medical History:  Diagnosis Date  . High cholesterol   . Hypertension     Patient Active Problem List   Diagnosis Date Noted  . 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 Snoring 12/14/2018  . Muscle strain 11/04/2018  . Essential hypertension 09/10/2018  . Mixed hyperlipidemia 09/10/2018  . Allergic rhinitis   . Uterine leiomyoma 08/19/2016  . Hyperglycemia 02/11/2013    Past Surgical History:  Procedure Laterality Date  . CHOLECYSTECTOMY    . TUBAL LIGATION      OB History   No obstetric history on file.      Home Medications    Prior to Admission medications   Medication Sig Start Date End Date Taking? Authorizing Provider  allopurinol (ZYLOPRIM) 100 MG tablet TAKE 1 TABLET(100 MG) BY MOUTH DAILY 06/20/20   Glendale Chard, MD  amLODipine (NORVASC) 5 MG tablet TAKE 1 TABLET(5 MG) BY MOUTH DAILY 05/30/20   Glendale Chard, MD  aspirin 81 MG tablet Take 81 mg by mouth daily.    [provider]  colchicine 0.6 MG tablet Take 1 tablet two times a day x 3 days with exacerbations 02/10/20   Minette Brine, FNP  ergocalciferol (VITAMIN D2) 1.25 MG (50000 UT) capsule ergocalciferol (vitamin D2) 1,250 mcg (50,000 unit) capsule  TAKE ONE CAPSULE BY MOUTH ON TUESDAYS AND FRIDAYS    [provider]  fluticasone (FLONASE) 50  MCG/ACT nasal spray Place 1 spray into both nostrils 2 (two) times daily. 06/30/20   Volney American, PA-C  hydrochlorothiazide (HYDRODIURIL) 25 MG tablet TAKE 1 TABLET(25 MG) BY MOUTH DAILY 05/03/20   Glendale Chard, MD  metoprolol succinate (TOPROL-XL) 100 MG 24 hr tablet TAKE 1 TABLET(100 MG) BY MOUTH DAILY WITH OR IMMEDIATELY FOLLOWING A MEAL 06/20/20   Glendale Chard, MD  montelukast (SINGULAIR) 10 MG tablet Take 1 tablet (10 mg total) by mouth at bedtime. 06/30/20   Volney American, PA-C  pravastatin (PRAVACHOL) 40 MG tablet Take 1 tablet (40 mg total) by mouth every evening. 03/23/20 03/23/21  Glendale Chard, MD  predniSONE (DELTASONE) 50 MG tablet Take one tablet in the morning with breakfast daily for 3 days 06/30/20   Volney American, PA-C    Family History Family History  Problem Relation Age of Onset  . Diabetes Mother   . Hypertension Mother   . Diabetes Sister     Social History Social History   Tobacco Use  . Smoking status: Never Smoker  . Smokeless tobacco: Never Used  Vaping Use  . Vaping Use: Never used  Substance Use Topics  . Alcohol use: Not Currently    Comment: occasional  . Drug use: No     Allergies   Other   Review of Systems Review of Systems  PER HPI  Physical Exam Triage  Vital Signs ED Triage Vitals  Enc Vitals Group     BP 06/30/20 1041 (!) 181/96     Pulse Rate 06/30/20 1041 67     Resp 06/30/20 1041 18     Temp 06/30/20 1041 98.2 F (36.8 C)     Temp Source 06/30/20 1041 Oral     SpO2 06/30/20 1041 98 %     Weight --      Height --      Head Circumference --      Peak Flow --      Pain Score 06/30/20 1040 0     Pain Loc --      Pain Edu? --      Excl. in [place]? --    No data found.  Updated Vital Signs BP (!) 181/96 (BP Location: Right Arm)   Pulse 67   Temp 98.2 F (36.8 C) (Oral)   Resp 18   SpO2 98%   Visual Acuity Right Eye Distance:   Left Eye Distance:   Bilateral Distance:    Right Eye Near:     Left Eye Near:    Bilateral Near:     Physical Exam Vitals and nursing note reviewed.  Constitutional:      Appearance: Normal appearance. She is not ill-appearing.  HENT:     Head: Atraumatic.     Right Ear: Tympanic membrane and ear canal normal.     Left Ear: Tympanic membrane and ear canal normal.     Nose: Rhinorrhea present.     Comments: Nasal mucosa boggy and erythematous Eyes:     General:        Right eye: Discharge present.        Left eye: Discharge present.    Extraocular Movements: Extraocular movements intact.     Comments: B/l conjunctiva mildly injected  Cardiovascular:     Rate and Rhythm: Normal rate and regular rhythm.     Heart sounds: Normal heart sounds.  Pulmonary:     Effort: Pulmonary effort is normal.     Breath sounds: Normal breath sounds. No wheezing or rales.  Musculoskeletal:        General: Normal range of motion.     Cervical back: Normal range of motion and neck supple.  Skin:    General: Skin is warm and dry.  Neurological:     Mental Status: She is alert and oriented to person, place, and time.  Psychiatric:        Mood and Affect: Mood normal.        Thought Content: Thought content normal.        Judgment: Judgment normal.      UC Treatments / Results  Labs (all labs ordered are listed, but only abnormal results are displayed) Labs Reviewed - No data to display  EKG   Radiology No results found.  Procedures Procedures (including critical care time)  Medications Ordered in UC Medications - No data to display  Initial Impression / Assessment and Plan / UC Course  I have reviewed the triage vital signs and the nursing notes.  Pertinent labs & imaging results that were available during my care of the patient were reviewed by me and considered in my medical decision making (see chart for details).     Sxs, duration consistent with allergic rhinitis - given severity reported, will initially treat with 3 day prednisone  burst while start singulair, flonase regimen. OTC antihistamine daily also recommended. Follow up with PCP  if not resolving.   Final Clinical Impressions(s) / UC Diagnoses   Final diagnoses:  Seasonal allergic rhinitis due to other allergic trigger   Discharge Instructions   None    ED Prescriptions    Medication Sig Dispense Auth. Provider   predniSONE (DELTASONE) 50 MG tablet Take one tablet in the morning with breakfast daily for 3 days 3 tablet Volney American, PA-C   montelukast (SINGULAIR) 10 MG tablet Take 1 tablet (10 mg total) by mouth at bedtime. 30 tablet Volney American, PA-C   fluticasone [facility]) 50 MCG/ACT nasal spray Place 1 spray into both nostrils 2 (two) times daily. 16 g Volney American, [place]     PDMP not reviewed this encounter.   Volney American, [place] 06/30/20 1110

## 2020-06-30 NOTE — ED Triage Notes (Signed)
Pt repots having nasal congestion 1 months. Pt states the nasal congestion is related to allergies and when is hot outside the symptoms are worse. Reports feeling drainage in her throat but not sore throat. Denies fever, body aches, SOB, cough. Pt not taking any medication for rhe complaints.

## 2020-06-30 NOTE — Telephone Encounter (Signed)
   Notes to clinic:  Patient requests 90 days supply   Requested Prescriptions  Pending Prescriptions Disp Refills   montelukast (SINGULAIR) 10 MG tablet [Pharmacy Med Name: MONTELUKAST 10MG  TABLETS] 90 tablet     Sig: TAKE 1 TABLET(10 MG) BY MOUTH AT BEDTIME      Pulmonology:  Leukotriene Inhibitors Failed - 06/30/2020  1:12 PM      Failed - Valid encounter within last 12 months    Recent Outpatient Visits   None     Future Appointments             In 1 week Glendale Chard, MD [facility]   In 2 months Glendale Chard, MD [facility]

## 2020-06-30 NOTE — Telephone Encounter (Signed)
  Notes to clinic:  Patient requests 90 days supply   Requested Prescriptions  Pending Prescriptions Disp Refills   montelukast (SINGULAIR) 10 MG tablet [Pharmacy Med Name: MONTELUKAST 10MG  TABLETS] 90 tablet     Sig: TAKE 1 TABLET(10 MG) BY MOUTH AT BEDTIME      Pulmonology:  Leukotriene Inhibitors Failed - 06/30/2020 11:48 AM      Failed - Valid encounter within last 12 months    Recent Outpatient Visits   None     Future Appointments             In 1 week Glendale Chard, MD [facility]   In 2 months Glendale Chard, MD [facility]

## 2020-07-06 ENCOUNTER — Telehealth: Payer: Self-pay

## 2020-07-06 NOTE — Telephone Encounter (Signed)
-----   Message from Glendale Chard, MD sent at 07/03/2020 11:24 AM EDT ----- Test is inconclusive. How is she feeling?

## 2020-07-06 NOTE — Telephone Encounter (Signed)
Attempt to reach out to pt

## 2020-07-10 DIAGNOSIS — R262 Difficulty in walking, not elsewhere classified: Secondary | ICD-10-CM | POA: Diagnosis not present

## 2020-07-10 DIAGNOSIS — R2689 Other abnormalities of gait and mobility: Secondary | ICD-10-CM | POA: Diagnosis not present

## 2020-07-10 DIAGNOSIS — M25562 Pain in left knee: Secondary | ICD-10-CM | POA: Diagnosis not present

## 2020-07-10 DIAGNOSIS — M6281 Muscle weakness (generalized): Secondary | ICD-10-CM | POA: Diagnosis not present

## 2020-07-11 ENCOUNTER — Encounter: Payer: Self-pay | Admitting: Internal Medicine

## 2020-07-11 ENCOUNTER — Other Ambulatory Visit: Payer: Self-pay

## 2020-07-11 ENCOUNTER — Ambulatory Visit (INDEPENDENT_AMBULATORY_CARE_PROVIDER_SITE_OTHER): Payer: Medicare Other | Admitting: Internal Medicine

## 2020-07-11 VITALS — BP 118/72 | HR 60 | Temp 97.8°F | Ht 62.6 in | Wt 170.6 lb

## 2020-07-11 DIAGNOSIS — Z6830 Body mass index (BMI) 30.0-30.9, adult: Secondary | ICD-10-CM

## 2020-07-11 DIAGNOSIS — E6609 Other obesity due to excess calories: Secondary | ICD-10-CM | POA: Diagnosis not present

## 2020-07-11 DIAGNOSIS — Z23 Encounter for immunization: Secondary | ICD-10-CM

## 2020-07-11 DIAGNOSIS — M1712 Unilateral primary osteoarthritis, left knee: Secondary | ICD-10-CM | POA: Diagnosis not present

## 2020-07-11 DIAGNOSIS — J301 Allergic rhinitis due to pollen: Secondary | ICD-10-CM

## 2020-07-11 DIAGNOSIS — I10 Essential (primary) hypertension: Secondary | ICD-10-CM | POA: Diagnosis not present

## 2020-07-11 NOTE — Patient Instructions (Signed)

## 2020-07-11 NOTE — Progress Notes (Signed)
I,Katawbba Wiggins,acting as a Education administrator for [name], MD.,have documented all relevant documentation on the behalf of [name], MD,as directed by  [name], MD while in the presence of [name], MD.  This visit occurred during the SARS-CoV-2 public health emergency.  Safety protocols were in place, including screening questions prior to the visit, additional usage of staff PPE, and extensive cleaning of exam room while observing appropriate contact time as indicated for disinfecting solutions.  Subjective:     Patient ID: Kathryn Mendez , female    DOB: [birth year] , 69 y.o.   MRN: [id number]   Chief Complaint  Patient presents with  . Hypertension    HPI  She is here today for BP check.  Hypertension This is a chronic problem. The current episode started more than 1 year ago. The problem has been gradually improving since onset. The problem is controlled. Pertinent negatives include no blurred vision, chest pain, palpitations or shortness of breath. Risk factors for coronary artery disease include dyslipidemia, post-menopausal state, sedentary lifestyle and obesity. The current treatment provides moderate improvement. Hypertensive end-organ damage includes kidney disease.     Past Medical History:  Diagnosis Date  . High cholesterol   . Hypertension      Family History  Problem Relation Age of Onset  . Diabetes Mother   . Hypertension Mother   . Diabetes Sister      Current Outpatient Medications:  .  amLODipine (NORVASC) 5 MG tablet, TAKE 1 TABLET(5 MG) BY MOUTH DAILY, Disp: 90 tablet, Rfl: 1 .  aspirin 81 MG tablet, Take 81 mg by mouth daily., Disp: , Rfl:  .  fluticasone (FLONASE) 50 MCG/ACT nasal spray, SHAKE LIQUID AND USE 1 SPRAY IN EACH NOSTRIL TWICE DAILY, Disp: 48 g, Rfl: 4 .  hydrochlorothiazide (HYDRODIURIL) 25 MG tablet, TAKE 1 TABLET(25 MG) BY MOUTH DAILY, Disp: 90 tablet, Rfl: 1 .  metoprolol succinate (TOPROL-XL) 100 MG 24 hr  tablet, TAKE 1 TABLET(100 MG) BY MOUTH DAILY WITH OR IMMEDIATELY FOLLOWING A MEAL, Disp: 90 tablet, Rfl: 1 .  montelukast (SINGULAIR) 10 MG tablet, TAKE 1 TABLET(10 MG) BY MOUTH AT BEDTIME, Disp: 90 tablet, Rfl: 4 .  pravastatin (PRAVACHOL) 40 MG tablet, Take 1 tablet (40 mg total) by mouth every evening., Disp: 90 tablet, Rfl: 1 .  allopurinol (ZYLOPRIM) 100 MG tablet, TAKE 1 TABLET(100 MG) BY MOUTH DAILY (Patient not taking: Reported on 07/11/2020), Disp: 90 tablet, Rfl: 0 .  colchicine 0.6 MG tablet, Take 1 tablet two times a day x 3 days with exacerbations (Patient not taking: Reported on 07/11/2020), Disp: 30 tablet, Rfl: 2   Allergies  Allergen Reactions  . Other     Food allergies     Review of Systems  Constitutional: Negative.   HENT: Positive for postnasal drip and rhinorrhea.   Eyes: Negative for blurred vision.  Respiratory: Negative.  Negative for shortness of breath.   Cardiovascular: Negative.  Negative for chest pain and palpitations.  Gastrointestinal: Negative.   Psychiatric/Behavioral: Negative.   All other systems reviewed and are negative.    Today's Vitals   07/11/20 0945  BP: 118/72  Pulse: 60  Temp: 97.8 F (36.6 C)  TempSrc: Oral  Weight: 170 lb 9.6 oz (77.4 kg)  Height: 5' 2.6" (1.59 m)  PainSc: 0-No pain   Body mass index is 30.61 kg/m.  Wt Readings from Last 3 Encounters:  07/11/20 170 lb 9.6 oz (77.4 kg)  05/09/20 170 lb  6.4 oz (77.3 kg)  04/24/20 170 lb 3.2 oz (77.2 kg)   Objective:  Physical Exam Vitals and nursing note reviewed.  Constitutional:      Appearance: Normal appearance. She is obese.  HENT:     Head: Normocephalic and atraumatic.  Cardiovascular:     Rate and Rhythm: Normal rate and regular rhythm.     Heart sounds: Normal heart sounds.  Pulmonary:     Breath sounds: Normal breath sounds.  Skin:    General: Skin is warm.  Neurological:     General: No focal deficit present.     Mental Status: She is alert and oriented  to person, place, and time.         Assessment And Plan:     1. Essential hypertension Comments: Chronic, well controlled. She will continue with current meds.   2. Seasonal allergic rhinitis due to pollen - Novel Coronavirus, NAA ([facility])  3. Need for vaccination Comments: She was given high dose flu vaccine to update her immunization history.  - Flu Vaccine QUAD High Dose(Fluad)  4. Class 1 obesity due to excess calories with serious comorbidity and body mass index (BMI) of 30.0 to 30.9 in adult  She is encouraged to strive for BMI less than 30 to decrease cardiac risk. Advised to aim for at least 150 minutes of exercise per week.   Patient was given opportunity to ask questions. Patient verbalized understanding of the plan and was able to repeat key elements of the plan. All questions were answered to their satisfaction.  [name], MD   I, [name], MD, have reviewed all documentation for this visit. The documentation on 07/24/20 for the exam, diagnosis, procedures, and orders are all accurate and complete.  THE PATIENT IS ENCOURAGED TO PRACTICE SOCIAL DISTANCING DUE TO THE COVID-19 PANDEMIC.

## 2020-07-13 DIAGNOSIS — M6281 Muscle weakness (generalized): Secondary | ICD-10-CM | POA: Diagnosis not present

## 2020-07-13 DIAGNOSIS — M25562 Pain in left knee: Secondary | ICD-10-CM | POA: Diagnosis not present

## 2020-07-13 DIAGNOSIS — R2689 Other abnormalities of gait and mobility: Secondary | ICD-10-CM | POA: Diagnosis not present

## 2020-07-13 DIAGNOSIS — R262 Difficulty in walking, not elsewhere classified: Secondary | ICD-10-CM | POA: Diagnosis not present

## 2020-07-13 LAB — SARS-COV-2, NAA 2 DAY TAT

## 2020-07-13 LAB — NOVEL CORONAVIRUS, NAA: SARS-CoV-2, NAA: NOT DETECTED

## 2020-07-18 DIAGNOSIS — M25562 Pain in left knee: Secondary | ICD-10-CM | POA: Diagnosis not present

## 2020-07-18 DIAGNOSIS — M6281 Muscle weakness (generalized): Secondary | ICD-10-CM | POA: Diagnosis not present

## 2020-07-18 DIAGNOSIS — R262 Difficulty in walking, not elsewhere classified: Secondary | ICD-10-CM | POA: Diagnosis not present

## 2020-07-18 DIAGNOSIS — R2689 Other abnormalities of gait and mobility: Secondary | ICD-10-CM | POA: Diagnosis not present

## 2020-07-20 DIAGNOSIS — R2689 Other abnormalities of gait and mobility: Secondary | ICD-10-CM | POA: Diagnosis not present

## 2020-07-20 DIAGNOSIS — M25562 Pain in left knee: Secondary | ICD-10-CM | POA: Diagnosis not present

## 2020-07-20 DIAGNOSIS — M6281 Muscle weakness (generalized): Secondary | ICD-10-CM | POA: Diagnosis not present

## 2020-07-20 DIAGNOSIS — R262 Difficulty in walking, not elsewhere classified: Secondary | ICD-10-CM | POA: Diagnosis not present

## 2020-07-24 DIAGNOSIS — R262 Difficulty in walking, not elsewhere classified: Secondary | ICD-10-CM | POA: Diagnosis not present

## 2020-07-24 DIAGNOSIS — R2689 Other abnormalities of gait and mobility: Secondary | ICD-10-CM | POA: Diagnosis not present

## 2020-07-24 DIAGNOSIS — M6281 Muscle weakness (generalized): Secondary | ICD-10-CM | POA: Diagnosis not present

## 2020-07-24 DIAGNOSIS — M25562 Pain in left knee: Secondary | ICD-10-CM | POA: Diagnosis not present

## 2020-07-26 DIAGNOSIS — R262 Difficulty in walking, not elsewhere classified: Secondary | ICD-10-CM | POA: Diagnosis not present

## 2020-07-26 DIAGNOSIS — M25562 Pain in left knee: Secondary | ICD-10-CM | POA: Diagnosis not present

## 2020-07-26 DIAGNOSIS — R2689 Other abnormalities of gait and mobility: Secondary | ICD-10-CM | POA: Diagnosis not present

## 2020-07-26 DIAGNOSIS — M6281 Muscle weakness (generalized): Secondary | ICD-10-CM | POA: Diagnosis not present

## 2020-07-27 ENCOUNTER — Other Ambulatory Visit: Payer: Self-pay | Admitting: Family Medicine

## 2020-08-14 DIAGNOSIS — Z23 Encounter for immunization: Secondary | ICD-10-CM | POA: Diagnosis not present

## 2020-08-16 ENCOUNTER — Telehealth: Payer: Self-pay

## 2020-08-16 NOTE — Telephone Encounter (Signed)
I left a message asking the pt to call and reschedule AWV with [place]. 

## 2020-09-01 ENCOUNTER — Other Ambulatory Visit: Payer: Self-pay | Admitting: Internal Medicine

## 2020-09-20 ENCOUNTER — Other Ambulatory Visit: Payer: Self-pay

## 2020-09-20 ENCOUNTER — Encounter: Payer: Self-pay | Admitting: Internal Medicine

## 2020-09-20 ENCOUNTER — Ambulatory Visit (INDEPENDENT_AMBULATORY_CARE_PROVIDER_SITE_OTHER): Payer: Medicare Other

## 2020-09-20 ENCOUNTER — Ambulatory Visit (INDEPENDENT_AMBULATORY_CARE_PROVIDER_SITE_OTHER): Payer: Medicare Other | Admitting: Internal Medicine

## 2020-09-20 VITALS — BP 144/70 | HR 59 | Temp 98.4°F | Ht 62.6 in | Wt 169.4 lb

## 2020-09-20 DIAGNOSIS — E7800 Pure hypercholesterolemia, unspecified: Secondary | ICD-10-CM

## 2020-09-20 DIAGNOSIS — M1A2720 Drug-induced chronic gout, left ankle and foot, without tophus (tophi): Secondary | ICD-10-CM

## 2020-09-20 DIAGNOSIS — Z6830 Body mass index (BMI) 30.0-30.9, adult: Secondary | ICD-10-CM

## 2020-09-20 DIAGNOSIS — Z0000 Encounter for general adult medical examination without abnormal findings: Secondary | ICD-10-CM

## 2020-09-20 DIAGNOSIS — E6609 Other obesity due to excess calories: Secondary | ICD-10-CM | POA: Diagnosis not present

## 2020-09-20 DIAGNOSIS — I10 Essential (primary) hypertension: Secondary | ICD-10-CM

## 2020-09-20 LAB — POCT URINALYSIS DIPSTICK
Bilirubin, UA: NEGATIVE
Blood, UA: NEGATIVE
Glucose, UA: NEGATIVE
Ketones, UA: NEGATIVE
Nitrite, UA: NEGATIVE
Protein, UA: NEGATIVE
Spec Grav, UA: 1.02 (ref 1.010–1.025)
Urobilinogen, UA: 0.2 E.U./dL
pH, UA: 7 (ref 5.0–8.0)

## 2020-09-20 LAB — POCT UA - MICROALBUMIN
Albumin/Creatinine Ratio, Urine, POC: <30
Creatinine, POC: 100 mg/dL
Microalbumin Ur, POC: 80 mg/L

## 2020-09-20 NOTE — Patient Instructions (Signed)
Kathryn Mendez , Thank you for taking time to come for your Medicare Wellness Visit. I appreciate your ongoing commitment to your health goals. Please review the following plan we discussed and let me know if I can assist you in the future.   Screening recommendations/referrals: Colonoscopy: completed 04/15/2018 Mammogram: completed 11/16/2019, due 11/15/2020 Bone Density: completed 09/04/2017 Recommended yearly ophthalmology/optometry visit for glaucoma screening and checkup Recommended yearly dental visit for hygiene and checkup  Vaccinations: Influenza vaccine: completed 07/11/2020 Pneumococcal vaccine: completed 09/10/2018 Tdap vaccine: completed 08/02/2013, due 08/03/2023 Shingles vaccine: discussed   Covid-19: 01/20/2020, 12/24/2019  Advanced directives: Advance directive discussed with you today. Even though you declined this today please call our office should you change your mind and we can give you the proper paperwork for you to fill out.  Conditions/risks identified: none  Next appointment: 11/15/2020 at 11:30 Follow up in one year for your annual wellness visit    Preventive Care 65 Years and Older, Female Preventive care refers to lifestyle choices and visits with your health care provider that can promote health and wellness. What does preventive care include?  A yearly physical exam. This is also called an annual well check.  Dental exams once or twice a year.  Routine eye exams. Ask your health care provider how often you should have your eyes checked.  Personal lifestyle choices, including:  Daily care of your teeth and gums.  Regular physical activity.  Eating a healthy diet.  Avoiding tobacco and drug use.  Limiting alcohol use.  Practicing safe sex.  Taking low-dose aspirin every day.  Taking vitamin and mineral supplements as recommended by your health care provider. What happens during an annual well check? The services and screenings done by your  health care provider during your annual well check will depend on your age, overall health, lifestyle risk factors, and family history of disease. Counseling  Your health care provider may ask you questions about your:  Alcohol use.  Tobacco use.  Drug use.  Emotional well-being.  Home and relationship well-being.  Sexual activity.  Eating habits.  History of falls.  Memory and ability to understand (cognition).  Work and work Statistician.  Reproductive health. Screening  You may have the following tests or measurements:  Height, weight, and BMI.  Blood pressure.  Lipid and cholesterol levels. These may be checked every 5 years, or more frequently if you are over 54 years old.  Skin check.  Lung cancer screening. You may have this screening every year starting at age 68 if you have a 30-pack-year history of smoking and currently smoke or have quit within the past 15 years.  Fecal occult blood test (FOBT) of the stool. You may have this test every year starting at age 35.  Flexible sigmoidoscopy or colonoscopy. You may have a sigmoidoscopy every 5 years or a colonoscopy every 10 years starting at age 24.  Hepatitis C blood test.  Hepatitis B blood test.  Sexually transmitted disease (STD) testing.  Diabetes screening. This is done by checking your blood sugar (glucose) after you have not eaten for a while (fasting). You may have this done every 1-3 years.  Bone density scan. This is done to screen for osteoporosis. You may have this done starting at age 74.  Mammogram. This may be done every 1-2 years. Talk to your health care provider about how often you should have regular mammograms. Talk with your health care provider about your test results, treatment options, and if necessary, the  need for more tests. Vaccines  Your health care provider may recommend certain vaccines, such as:  Influenza vaccine. This is recommended every year.  Tetanus, diphtheria, and  acellular pertussis (Tdap, Td) vaccine. You may need a Td booster every 10 years.  Zoster vaccine. You may need this after age 43.  Pneumococcal 13-valent conjugate (PCV13) vaccine. One dose is recommended after age 82.  Pneumococcal polysaccharide (PPSV23) vaccine. One dose is recommended after age 25. Talk to your health care provider about which screenings and vaccines you need and how often you need them. This information is not intended to replace advice given to you by your health care provider. Make sure you discuss any questions you have with your health care provider. Document Released: 11/10/2015 Document Revised: 07/03/2016 Document Reviewed: 08/15/2015 Elsevier Interactive Patient Education  2017 [employer] Prevention in the Home Falls can cause injuries. They can happen to people of all ages. There are many things you can do to make your home safe and to help prevent falls. What can I do on the outside of my home?  Regularly fix the edges of walkways and driveways and fix any cracks.  Remove anything that might make you trip as you walk through a door, such as a raised step or threshold.  Trim any bushes or trees on the path to your home.  Use bright outdoor lighting.  Clear any walking paths of anything that might make someone trip, such as rocks or tools.  Regularly check to see if handrails are loose or broken. Make sure that both sides of any steps have handrails.  Any raised decks and porches should have guardrails on the edges.  Have any leaves, snow, or ice cleared regularly.  Use sand or salt on walking paths during winter.  Clean up any spills in your garage right away. This includes oil or grease spills. What can I do in the bathroom?  Use night lights.  Install grab bars by the toilet and in the tub and shower. Do not use towel bars as grab bars.  Use non-skid mats or decals in the tub or shower.  If you need to sit down in the shower, use  a plastic, non-slip stool.  Keep the floor dry. Clean up any water that spills on the floor as soon as it happens.  Remove soap buildup in the tub or shower regularly.  Attach bath mats securely with double-sided non-slip rug tape.  Do not have throw rugs and other things on the floor that can make you trip. What can I do in the bedroom?  Use night lights.  Make sure that you have a light by your bed that is easy to reach.  Do not use any sheets or blankets that are too big for your bed. They should not hang down onto the floor.  Have a firm chair that has side arms. You can use this for support while you get dressed.  Do not have throw rugs and other things on the floor that can make you trip. What can I do in the kitchen?  Clean up any spills right away.  Avoid walking on wet floors.  Keep items that you use a lot in easy-to-reach places.  If you need to reach something above you, use a strong step stool that has a grab bar.  Keep electrical cords out of the way.  Do not use floor polish or wax that makes floors slippery. If you must use wax,  use non-skid floor wax.  Do not have throw rugs and other things on the floor that can make you trip. What can I do with my stairs?  Do not leave any items on the stairs.  Make sure that there are handrails on both sides of the stairs and use them. Fix handrails that are broken or loose. Make sure that handrails are as long as the stairways.  Check any carpeting to make sure that it is firmly attached to the stairs. Fix any carpet that is loose or worn.  Avoid having throw rugs at the top or bottom of the stairs. If you do have throw rugs, attach them to the floor with carpet tape.  Make sure that you have a light switch at the top of the stairs and the bottom of the stairs. If you do not have them, ask someone to add them for you. What else can I do to help prevent falls?  Wear shoes that:  Do not have high heels.  Have  rubber bottoms.  Are comfortable and fit you well.  Are closed at the toe. Do not wear sandals.  If you use a stepladder:  Make sure that it is fully opened. Do not climb a closed stepladder.  Make sure that both sides of the stepladder are locked into place.  Ask someone to hold it for you, if possible.  Clearly mark and make sure that you can see:  Any grab bars or handrails.  First and last steps.  Where the edge of each step is.  Use tools that help you move around (mobility aids) if they are needed. These include:  Canes.  Walkers.  Scooters.  Crutches.  Turn on the lights when you go into a dark area. Replace any light bulbs as soon as they burn out.  Set up your furniture so you have a clear path. Avoid moving your furniture around.  If any of your floors are uneven, fix them.  If there are any pets around you, be aware of where they are.  Review your medicines with your doctor. Some medicines can make you feel dizzy. This can increase your chance of falling. Ask your doctor what other things that you can do to help prevent falls. This information is not intended to replace advice given to you by your health care provider. Make sure you discuss any questions you have with your health care provider. Document Released: 08/10/2009 Document Revised: 03/21/2016 Document Reviewed: 11/18/2014 Elsevier Interactive Patient Education  2017 [employer].

## 2020-09-20 NOTE — Progress Notes (Signed)
This visit occurred during the SARS-CoV-2 public health emergency.  Safety protocols were in place, including screening questions prior to the visit, additional usage of staff PPE, and extensive cleaning of exam room while observing appropriate contact time as indicated for disinfecting solutions.  Subjective:   Kathryn Mendez is a 69 y.o. female who presents for Medicare Annual (Subsequent) preventive examination.  Review of Systems     Cardiac Risk Factors include: advanced age (>75men, >73 women);dyslipidemia;hypertension     Objective:    Today's Vitals   09/20/20 1233  BP: (!) 144/70  Pulse: (!) 59  Temp: 98.4 F (36.9 C)  TempSrc: Oral  Weight: 169 lb 6.4 oz (76.8 kg)  Height: 5' 2.6" (1.59 m)   Body mass index is 30.39 kg/m.  Advanced Directives 09/20/2020 09/16/2019 09/10/2018  Does Patient Have a Medical Advance Directive? No No No  Would patient like information on creating a medical advance directive? No - Patient declined No - Patient declined Yes (MAU/Ambulatory/Procedural Areas - Information given)    Current Medications (verified) Outpatient Encounter Medications as of 09/20/2020  Medication Sig  . allopurinol (ZYLOPRIM) 100 MG tablet TAKE 1 TABLET(100 MG) BY MOUTH DAILY (Patient not taking: Reported on 07/11/2020)  . amLODipine (NORVASC) 5 MG tablet TAKE 1 TABLET(5 MG) BY MOUTH DAILY  . aspirin 81 MG tablet Take 81 mg by mouth daily.  . colchicine 0.6 MG tablet Take 1 tablet two times a day x 3 days with exacerbations (Patient not taking: Reported on 07/11/2020)  . fluticasone (FLONASE) 50 MCG/ACT nasal spray SHAKE LIQUID AND USE 1 SPRAY IN EACH NOSTRIL TWICE DAILY  . hydrochlorothiazide (HYDRODIURIL) 25 MG tablet TAKE 1 TABLET(25 MG) BY MOUTH DAILY  . metoprolol succinate (TOPROL-XL) 100 MG 24 hr tablet TAKE 1 TABLET(100 MG) BY MOUTH DAILY WITH OR IMMEDIATELY FOLLOWING A MEAL  . montelukast (SINGULAIR) 10 MG tablet TAKE 1 TABLET(10 MG) BY MOUTH AT  BEDTIME  . pravastatin (PRAVACHOL) 40 MG tablet Take 1 tablet (40 mg total) by mouth every evening.   No facility-administered encounter medications on file as of 09/20/2020.    Allergies (verified) Other   History: Past Medical History:  Diagnosis Date  . High cholesterol   . Hypertension    Past Surgical History:  Procedure Laterality Date  . CHOLECYSTECTOMY    . TUBAL LIGATION     Family History  Problem Relation Age of Onset  . Diabetes Mother   . Hypertension Mother   . Diabetes Sister    Social History   Socioeconomic History  . Marital status: Married    Spouse name: Not on file  . Number of children: Not on file  . Years of education: Not on file  . Highest education level: Not on file  Occupational History  . Occupation: retired  Tobacco Use  . Smoking status: Never Smoker  . Smokeless tobacco: Never Used  Vaping Use  . Vaping Use: Never used  Substance and Sexual Activity  . Alcohol use: Not Currently    Comment: occasional  . Drug use: No  . Sexual activity: Not Currently  Other Topics Concern  . Not on file  Social History Narrative  . Not on file   Social Determinants of Health   Financial Resource Strain: Low Risk   . Difficulty of Paying Living Expenses: Not hard at all  Food Insecurity: No Food Insecurity  . Worried About Charity fundraiser in the Last Year: Never true  . Ran Out of  Food in the Last Year: Never true  Transportation Needs: No Transportation Needs  . Lack of Transportation (Medical): No  . Lack of Transportation (Non-Medical): No  Physical Activity: Insufficiently Active  . Days of Exercise per Week: 2 days  . Minutes of Exercise per Session: 30 min  Stress: No Stress Concern Present  . Feeling of Stress : Not at all  Social Connections:   . Frequency of Communication with Friends and Family: Not on file  . Frequency of Social Gatherings with Friends and Family: Not on file  . Attends Religious Services: Not on file    . Active Member of Clubs or Organizations: Not on file  . Attends Archivist Meetings: Not on file  . Marital Status: Not on file    Tobacco Counseling Counseling given: Not Answered   Clinical Intake:  Pre-visit preparation completed: Yes  Pain : No/denies pain     Nutritional Status: BMI > 30  Obese Nutritional Risks: None Diabetes: No  How often do you need to have someone help you when you read instructions, pamphlets, or other written materials from your doctor or pharmacy?: 1 - Never What is the last grade level you completed in school?: 11th grade  Diabetic? no  Interpreter Needed?: No  Information entered by :: NAllen LPN   Activities of Daily Living In your present state of health, do you have any difficulty performing the following activities: 09/20/2020 09/20/2020  Hearing? N N  Vision? N N  Difficulty concentrating or making decisions? N N  Walking or climbing stairs? N N  Dressing or bathing? N N  Doing errands, shopping? N N  Preparing Food and eating ? N -  Using the Toilet? N -  In the past six months, have you accidently leaked urine? N -  Do you have problems with loss of bowel control? N -  Managing your Medications? N -  Managing your Finances? N -  Housekeeping or managing your Housekeeping? N -  Some recent data might be hidden    Patient Care Team: Kathryn Chard, MD as PCP - General (Internal Medicine) Kathryn Guys, MD as Consulting Physician (Ophthalmology)  Indicate any recent Medical Services you may have received from other than [facility] providers in the past year (date may be approximate).     Assessment:   This is a routine wellness examination for Kathryn Mendez.  Hearing/Vision screen  Hearing Screening   125Hz  250Hz  500Hz  1000Hz  2000Hz  3000Hz  4000Hz  6000Hz  8000Hz   Right ear:           Left ear:           Vision Screening Comments: Regular eye exams, Dr. Gershon Mendez  Dietary issues and exercise activities  discussed: Current Exercise Habits: Home exercise routine, Type of exercise: walking, Time (Minutes): 20, Frequency (Times/Week): 3, Weekly Exercise (Minutes/Week): 60  Goals    .  Patient Stated      09/16/2019, just to stay healthy    .  Patient Stated      11/21/2019, lose 10 pounds    .  Weight (lb) < 200 lb (90.7 kg) (pt-stated)      Just watching weight      Depression Screen PHQ 2/9 Scores 09/20/2020 02/01/2020 09/16/2019 03/15/2019 12/14/2018 11/04/2018 09/10/2018  PHQ - 2 Score 0 0 0 0 0 0 0  PHQ- 9 Score - - 0 - - - -    Fall Risk Fall Risk  09/20/2020 02/01/2020 09/16/2019 03/15/2019 12/14/2018  Falls in the  past year? 0 0 0 0 0  Comment - - - - -  Risk for fall due to : Medication side effect - Medication side effect - -  Follow up Falls evaluation completed;Education provided;Falls prevention discussed - Falls evaluation completed;Education provided;Falls prevention discussed - -    Any stairs in or around the home? Yes  If so, are there any without handrails? Yes  Home free of loose throw rugs in walkways, pet beds, electrical cords, etc? Yes  Adequate lighting in your home to reduce risk of falls? Yes   ASSISTIVE DEVICES UTILIZED TO PREVENT FALLS:  Life alert? No  Use of a cane, walker or w/c? No  Grab bars in the bathroom? No  Shower chair or bench in shower? Yes  Elevated toilet seat or a handicapped toilet? Yes   TIMED UP AND GO:  Was the test performed? No . .   Gait steady and fast without use of assistive device  Cognitive Function:     6CIT Screen 09/20/2020 09/16/2019 09/10/2018  What Year? 0 points 0 points 0 points  What month? 0 points 0 points 0 points  What time? 0 points 0 points 0 points  Count back from 20 0 points 0 points 0 points  Months in reverse 4 points 0 points 4 points  Repeat phrase 2 points 4 points 2 points  Total Score 6 4 6     Immunizations Immunization History  Administered Date(s) Administered  . Fluad Quad(high Dose  65+) 07/11/2020  . Influenza, High Dose Seasonal PF 08/31/2018, 07/16/2019  . Influenza-Unspecified 08/28/2018  . PFIZER SARS-COV-2 Vaccination 12/24/2019, 01/20/2020  . Pneumococcal Polysaccharide-23 09/10/2018    TDAP status: Up to date Flu Vaccine status: Up to date Pneumococcal vaccine status: Up to date Covid-19 vaccine status: Completed vaccines  Qualifies for Shingles Vaccine? Yes   Zostavax completed No   Shingrix Completed?: No.    Education has been provided regarding the importance of this vaccine. Patient has been advised to call insurance company to determine out of pocket expense if they have not yet received this vaccine. Advised may also receive vaccine at local pharmacy or Health Dept. Verbalized acceptance and understanding.  Screening Tests Health Maintenance  Topic Date Due  . MAMMOGRAM  11/15/2021  . TETANUS/TDAP  08/03/2023  . COLONOSCOPY  04/15/2028  . INFLUENZA VACCINE  Completed  . DEXA SCAN  Completed  . COVID-19 Vaccine  Completed  . Hepatitis C Screening  Completed  . PNA vac Low Risk Adult  Completed    Health Maintenance  There are no preventive care reminders to display for this patient.  Colorectal cancer screening: Completed 04/15/2018. Repeat every 10 years Mammogram status: Completed 11/16/2019. Repeat every year Bone Density status: Completed 09/04/2017.   Lung Cancer Screening: (Low Dose CT Chest recommended if Age 102-80 years, 30 pack-year currently smoking OR have quit w/in 15years.) does not qualify.   Lung Cancer Screening Referral: no  Additional Screening:  Hepatitis C Screening: does qualify; Completed 03/14/2014   Vision Screening: Recommended annual ophthalmology exams for early detection of glaucoma and other disorders of the eye. Is the patient up to date with their annual eye exam?  Yes  Who is the provider or what is the name of the office in which the patient attends annual eye exams? Dr. Okey Dupre If pt is not established  with a provider, would they like to be referred to a provider to establish care? No .   Dental Screening: Recommended annual dental  exams for proper oral hygiene  Community Resource Referral / Chronic Care Management: CRR required this visit?  No   CCM required this visit?  No      Plan:     I have personally reviewed and noted the following in the patient's chart:   . Medical and social history . Use of alcohol, tobacco or illicit drugs  . Current medications and supplements . Functional ability and status . Nutritional status . Physical activity . Advanced directives . List of other physicians . Hospitalizations, surgeries, and ER visits in previous 12 months . Vitals . Screenings to include cognitive, depression, and falls . Referrals and appointments  In addition, I have reviewed and discussed with patient certain preventive protocols, quality metrics, and best practice recommendations. A written personalized care plan for preventive services as well as general preventive health recommendations were provided to patient.     Kellie Simmering, LPN   [id number]/02/[id number]   Nurse Notes:

## 2020-09-20 NOTE — Addendum Note (Signed)
Addended by: Kellie Simmering on: 09/20/2020 02:51 PM   Modules accepted: Orders

## 2020-09-20 NOTE — Progress Notes (Signed)
I,Katawbba Wiggins,acting as a Education administrator for [name], MD.,have documented all relevant documentation on the behalf of [name], MD,as directed by  [name], MD while in the presence of [name], MD.  This visit occurred during the SARS-CoV-2 public health emergency.  Safety protocols were in place, including screening questions prior to the visit, additional usage of staff PPE, and extensive cleaning of exam room while observing appropriate contact time as indicated for disinfecting solutions.  Subjective:     Patient ID: Kathryn Mendez , female    DOB: Dec 09, 1950 , 69 y.o.   MRN: [id number]   Chief Complaint  Patient presents with  . Hypertension    HPI  She is here today for BP and cholesterol check.  She reports compliance with meds. Denies headaches, chest pain and shortness of breath. She is also scheduled for AWV today with [facility] advisor.   Hypertension This is a chronic problem. The current episode started more than 1 year ago. The problem has been gradually improving since onset. The problem is controlled. Pertinent negatives include no blurred vision, chest pain, palpitations or shortness of breath. Risk factors for coronary artery disease include dyslipidemia, post-menopausal state, sedentary lifestyle and obesity. The current treatment provides moderate improvement. Hypertensive end-organ damage includes kidney disease.     Past Medical History:  Diagnosis Date  . High cholesterol   . Hypertension      Family History  Problem Relation Age of Onset  . Diabetes Mother   . Hypertension Mother   . Diabetes Sister      Current Outpatient Medications:  .  amLODipine (NORVASC) 5 MG tablet, TAKE 1 TABLET(5 MG) BY MOUTH DAILY, Disp: 90 tablet, Rfl: 1 .  aspirin 81 MG tablet, Take 81 mg by mouth daily., Disp: , Rfl:  .  fluticasone (FLONASE) 50 MCG/ACT nasal spray, SHAKE LIQUID AND USE 1 SPRAY IN EACH NOSTRIL TWICE DAILY, Disp: 48 g, Rfl: 4 .   hydrochlorothiazide (HYDRODIURIL) 25 MG tablet, TAKE 1 TABLET(25 MG) BY MOUTH DAILY, Disp: 90 tablet, Rfl: 1 .  metoprolol succinate (TOPROL-XL) 100 MG 24 hr tablet, TAKE 1 TABLET(100 MG) BY MOUTH DAILY WITH OR IMMEDIATELY FOLLOWING A MEAL, Disp: 90 tablet, Rfl: 1 .  montelukast (SINGULAIR) 10 MG tablet, TAKE 1 TABLET(10 MG) BY MOUTH AT BEDTIME, Disp: 90 tablet, Rfl: 4 .  allopurinol (ZYLOPRIM) 100 MG tablet, TAKE 1 TABLET(100 MG) BY MOUTH DAILY, Disp: 90 tablet, Rfl: 0 .  colchicine 0.6 MG tablet, Take 1 tablet two times a day x 3 days with exacerbations (Patient not taking: Reported on 07/11/2020), Disp: 30 tablet, Rfl: 2 .  pravastatin (PRAVACHOL) 40 MG tablet, TAKE 1 TABLET(40 MG) BY MOUTH EVERY EVENING, Disp: 90 tablet, Rfl: 1   Allergies  Allergen Reactions  . Other     Food allergies     Review of Systems  Constitutional: Negative.   Eyes: Negative for blurred vision.  Respiratory: Negative.  Negative for shortness of breath.   Cardiovascular: Negative.  Negative for chest pain and palpitations.  Gastrointestinal: Negative.   Psychiatric/Behavioral: Negative.   All other systems reviewed and are negative.    Today's Vitals   09/20/20 1226  BP: (!) 144/70  Pulse: (!) 59  Temp: 98.4 F (36.9 C)  TempSrc: Oral  Weight: 169 lb 6.4 oz (76.8 kg)  Height: 5' 2.6" (1.59 m)  PainSc: 0-No pain   Body mass index is 30.39 kg/m.  Wt Readings from Last 3 Encounters:  09/20/20 169 lb 6.4 oz (76.8 kg)  09/20/20 169 lb 6.4 oz (76.8 kg)  07/11/20 170 lb 9.6 oz (77.4 kg)   Objective:  Physical Exam Vitals and nursing note reviewed.  Constitutional:      Appearance: Normal appearance. She is obese.  HENT:     Head: Normocephalic and atraumatic.  Cardiovascular:     Rate and Rhythm: Normal rate and regular rhythm.     Heart sounds: Normal heart sounds.  Pulmonary:     Breath sounds: Normal breath sounds.  Skin:    General: Skin is warm.  Neurological:     General: No focal  deficit present.     Mental Status: She is alert and oriented to person, place, and time.         Assessment And Plan:     1. Essential hypertension Comments: Chronic, fair control. She will continue with current meds for now. She is encouraged to avoid adding salt to her foods.  - CMP14+EGFR  2. Pure hypercholesterolemia Comments: Chronic, I will check non-fasting lipid panel. Encouraged to avoid fried foods and increase daily activity.  - Lipid panel  3. 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 She is encouraged to stay well hydrated. Will consider resuming allopurinol after reviewing lab results.  - Uric acid  4. Class 1 obesity due to excess calories with serious comorbidity and body mass index (BMI) of 30.0 to 30.9 in adult She is encouraged to strive for BMI less than 30 to decrease cardiac risk. Advised to aim for at least 150 minutes of exercise per week.    Patient was given opportunity to ask questions. Patient verbalized understanding of the plan and was able to repeat key elements of the plan. All questions were answered to their satisfaction.  [name], MD   I, [name], MD, have reviewed all documentation for this visit. The documentation on 09/25/20 for the exam, diagnosis, procedures, and orders are all accurate and complete.  THE PATIENT IS ENCOURAGED TO PRACTICE SOCIAL DISTANCING DUE TO THE COVID-19 PANDEMIC.

## 2020-09-21 LAB — CMP14+EGFR
ALT: 13 IU/L (ref 0–32)
AST: 16 IU/L (ref 0–40)
Albumin/Globulin Ratio: 1.4 (ref 1.2–2.2)
Albumin: 4 g/dL (ref 3.8–4.8)
Alkaline Phosphatase: 75 IU/L (ref 44–121)
BUN/Creatinine Ratio: 15 (ref 12–28)
BUN: 12 mg/dL (ref 8–27)
Bilirubin Total: 0.4 mg/dL (ref 0.0–1.2)
CO2: 29 mmol/L (ref 20–29)
Calcium: 9.3 mg/dL (ref 8.7–10.3)
Chloride: 101 mmol/L (ref 96–106)
Creatinine, Ser: 0.78 mg/dL (ref 0.57–1.00)
GFR calc Af Amer: 90 mL/min/{1.73_m2} (ref >59)
GFR calc non Af Amer: 78 mL/min/{1.73_m2} (ref >59)
Globulin, Total: 2.8 g/dL (ref 1.5–4.5)
Glucose: 99 mg/dL (ref 65–99)
Potassium: 3.5 mmol/L (ref 3.5–5.2)
Sodium: 142 mmol/L (ref 134–144)
Total Protein: 6.8 g/dL (ref 6.0–8.5)

## 2020-09-21 LAB — LIPID PANEL
Chol/HDL Ratio: 2.9 ratio (ref 0.0–4.4)
Cholesterol, Total: 238 mg/dL — ABNORMAL HIGH (ref 100–199)
HDL: 83 mg/dL (ref >39)
LDL Chol Calc (NIH): 144 mg/dL — ABNORMAL HIGH (ref 0–99)
Triglycerides: 64 mg/dL (ref 0–149)
VLDL Cholesterol Cal: 11 mg/dL (ref 5–40)

## 2020-09-21 LAB — URIC ACID: Uric Acid: 7.5 mg/dL — ABNORMAL HIGH (ref 3.0–7.2)

## 2020-09-25 ENCOUNTER — Other Ambulatory Visit: Payer: Self-pay | Admitting: Internal Medicine

## 2020-09-26 ENCOUNTER — Ambulatory Visit: Payer: Medicare Other | Admitting: Internal Medicine

## 2020-09-29 ENCOUNTER — Telehealth: Payer: Self-pay

## 2020-09-29 NOTE — Telephone Encounter (Signed)
Patient answered, she wants to proceed with the medication, feeling well. I did state that if she has any future complications to feel free to give [place] a call.

## 2020-10-16 ENCOUNTER — Other Ambulatory Visit: Payer: Self-pay | Admitting: Internal Medicine

## 2020-10-16 DIAGNOSIS — Z1231 Encounter for screening mammogram for malignant neoplasm of breast: Secondary | ICD-10-CM

## 2020-11-06 ENCOUNTER — Ambulatory Visit: Payer: Self-pay | Admitting: Internal Medicine

## 2020-11-10 DIAGNOSIS — H2513 Age-related nuclear cataract, bilateral: Secondary | ICD-10-CM | POA: Diagnosis not present

## 2020-11-14 ENCOUNTER — Other Ambulatory Visit: Payer: Self-pay | Admitting: Internal Medicine

## 2020-11-15 ENCOUNTER — Encounter: Payer: Self-pay | Admitting: Internal Medicine

## 2020-11-15 ENCOUNTER — Other Ambulatory Visit: Payer: Self-pay

## 2020-11-15 ENCOUNTER — Ambulatory Visit (INDEPENDENT_AMBULATORY_CARE_PROVIDER_SITE_OTHER): Payer: Medicare Other | Admitting: Internal Medicine

## 2020-11-15 VITALS — BP 124/70 | HR 68 | Temp 97.9°F | Ht 62.6 in | Wt 173.0 lb

## 2020-11-15 DIAGNOSIS — Z6831 Body mass index (BMI) 31.0-31.9, adult: Secondary | ICD-10-CM

## 2020-11-15 DIAGNOSIS — Z23 Encounter for immunization: Secondary | ICD-10-CM

## 2020-11-15 DIAGNOSIS — I10 Essential (primary) hypertension: Secondary | ICD-10-CM

## 2020-11-15 DIAGNOSIS — E6609 Other obesity due to excess calories: Secondary | ICD-10-CM

## 2020-11-15 DIAGNOSIS — Z91018 Allergy to other foods: Secondary | ICD-10-CM

## 2020-11-15 DIAGNOSIS — M1A2720 Drug-induced chronic gout, left ankle and foot, without tophus (tophi): Secondary | ICD-10-CM

## 2020-11-15 MED ORDER — EPINEPHRINE 0.3 MG/0.3ML IJ SOAJ: 0.3000 mg | INTRAMUSCULAR | 1 refills | Status: DC | PRN

## 2020-11-15 MED ORDER — HYDROCHLOROTHIAZIDE 25 MG PO TABS
ORAL_TABLET | ORAL | 1 refills | Status: DC
Start: 2020-11-15 — End: 2021-05-18

## 2020-11-15 MED ORDER — SHINGRIX 50 MCG/0.5ML IM SUSR: 0.5000 mL | Freq: Once | INTRAMUSCULAR | 0 refills | Status: AC

## 2020-11-15 NOTE — Progress Notes (Signed)
I,Katawbba Wiggins,acting as a Education administrator for [name], MD.,have documented all relevant documentation on the behalf of [name], MD,as directed by  [name], MD while in the presence of [name], MD.  This visit occurred during the SARS-CoV-2 public health emergency.  Safety protocols were in place, including screening questions prior to the visit, additional usage of staff PPE, and extensive cleaning of exam room while observing appropriate contact time as indicated for disinfecting solutions.  Subjective:     Patient ID: Kathryn Mendez , female    DOB: 01/03/1951 , 70 y.o.   MRN: [id number]   Chief Complaint  Patient presents with   Hypertension    HPI  The patient is here today for a blood pressure check and gout f/u. She was started on allopurinol at her last visit. She has not had any issues with the medication.  She has not had any gout flares since being on the medication.   Hypertension This is a chronic problem. The current episode started more than 1 year ago. The problem has been gradually improving since onset. The problem is controlled. Pertinent negatives include no blurred vision, chest pain, palpitations or shortness of breath. Risk factors for coronary artery disease include dyslipidemia, post-menopausal state, sedentary lifestyle and obesity. The current treatment provides moderate improvement. Hypertensive end-organ damage includes kidney disease.     Past Medical History:  Diagnosis Date   High cholesterol    Hypertension      Family History  Problem Relation Age of Onset   Diabetes Mother    Hypertension Mother    Diabetes Sister      Current Outpatient Medications:    allopurinol (ZYLOPRIM) 100 MG tablet, TAKE 1 TABLET(100 MG) BY MOUTH DAILY, Disp: 90 tablet, Rfl: 0   amLODipine (NORVASC) 5 MG tablet, TAKE 1 TABLET(5 MG) BY MOUTH DAILY, Disp: 90 tablet, Rfl: 1   aspirin 81 MG tablet, Take 81 mg by mouth daily., Disp:  , Rfl:    colchicine 0.6 MG tablet, Take 1 tablet two times a day x 3 days with exacerbations, Disp: 30 tablet, Rfl: 2   EPINEPHrine (EPIPEN 2-PAK) 0.3 mg/0.3 mL IJ SOAJ injection, Inject 0.3 mg into the muscle as needed for anaphylaxis., Disp: 1 each, Rfl: 1   fluticasone (FLONASE) 50 MCG/ACT nasal spray, SHAKE LIQUID AND USE 1 SPRAY IN EACH NOSTRIL TWICE DAILY, Disp: 48 g, Rfl: 4   metoprolol succinate (TOPROL-XL) 100 MG 24 hr tablet, TAKE 1 TABLET(100 MG) BY MOUTH DAILY WITH OR IMMEDIATELY FOLLOWING A MEAL, Disp: 90 tablet, Rfl: 1   montelukast (SINGULAIR) 10 MG tablet, TAKE 1 TABLET(10 MG) BY MOUTH AT BEDTIME, Disp: 90 tablet, Rfl: 4   pravastatin (PRAVACHOL) 40 MG tablet, TAKE 1 TABLET(40 MG) BY MOUTH EVERY EVENING, Disp: 90 tablet, Rfl: 1   hydrochlorothiazide (HYDRODIURIL) 25 MG tablet, TAKE 1 TABLET(25 MG) BY MOUTH DAILY, Disp: 90 tablet, Rfl: 1   Allergies  Allergen Reactions   Other     Food allergies     Review of Systems  Constitutional: Negative.   Eyes: Negative for blurred vision.  Respiratory: Negative.  Negative for shortness of breath.   Cardiovascular: Negative.  Negative for chest pain and palpitations.  Gastrointestinal: Negative.   Psychiatric/Behavioral: Negative.   All other systems reviewed and are negative.    Today's Vitals   11/15/20 1011  BP: 124/70  Pulse: 68  Temp: 97.9 F (36.6 C)  TempSrc: Oral  Weight: 173 lb (  78.5 kg)  Height: 5' 2.6" (1.59 m)  PainSc: 0-No pain   Body mass index is 31.04 kg/m.  Wt Readings from Last 3 Encounters:  11/15/20 173 lb (78.5 kg)  09/20/20 169 lb 6.4 oz (76.8 kg)  09/20/20 169 lb 6.4 oz (76.8 kg)   Objective:  Physical Exam Vitals and nursing note reviewed.  Constitutional:      Appearance: Normal appearance. She is obese.  HENT:     Head: Normocephalic and atraumatic.  Cardiovascular:     Rate and Rhythm: Normal rate and regular rhythm.     Heart sounds: Normal heart sounds.  Pulmonary:      Breath sounds: Normal breath sounds.  Skin:    General: Skin is warm.  Neurological:     General: No focal deficit present.     Mental Status: She is alert and oriented to person, place, and time.         Assessment And Plan:     1. Essential hypertension Comments: Chronic, well controlled. She will continue with current meds. She is encouraged to avoid adding salt to her foods. I will check renal function.   2. 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  - Uric acid  3. Multiple food allergies Comments: I will send rx Epi-pen to her local pharmacy. She is encouraged to keep it in her purse at all times.   4. Class 1 obesity due to excess calories with serious comorbidity and body mass index (BMI) of 31.0 to 31.9 in adult Comments: She is encouraged to strive for BMI less than 29 to decrease cardiac risk. Advised to aim for at least 150 minutes of exercise per week.  5. Immunization due Comments: Rx Shingrix vaccine was sent to the pharmacy.  Patient was given opportunity to ask questions. Patient verbalized understanding of the plan and was able to repeat key elements of the plan. All questions were answered to their satisfaction.  [name], MD   I, [name], MD, have reviewed all documentation for this visit. The documentation on 11/15/20 for the exam, diagnosis, procedures, and orders are all accurate and complete.  THE PATIENT IS ENCOURAGED TO PRACTICE SOCIAL DISTANCING DUE TO THE COVID-19 PANDEMIC.

## 2020-11-15 NOTE — Patient Instructions (Signed)

## 2020-11-16 LAB — URIC ACID: Uric Acid: 6.6 mg/dL (ref 3.0–7.2)

## 2020-11-23 ENCOUNTER — Other Ambulatory Visit: Payer: Self-pay

## 2020-11-23 ENCOUNTER — Ambulatory Visit
Admission: RE | Admit: 2020-11-23 | Discharge: 2020-11-23 | Disposition: A | Discharge status: Home / Self Care | Payer: Medicare Other | Source: Ambulatory Visit | Attending: Internal Medicine | Admitting: Internal Medicine

## 2020-11-23 DIAGNOSIS — Z1231 Encounter for screening mammogram for malignant neoplasm of breast: Secondary | ICD-10-CM | POA: Diagnosis not present

## 2020-12-22 ENCOUNTER — Other Ambulatory Visit: Payer: Self-pay | Admitting: Internal Medicine

## 2021-02-08 ENCOUNTER — Other Ambulatory Visit: Payer: Self-pay

## 2021-02-08 ENCOUNTER — Encounter (HOSPITAL_COMMUNITY): Payer: Self-pay

## 2021-02-08 ENCOUNTER — Ambulatory Visit (HOSPITAL_COMMUNITY)
Admission: EM | Admit: 2021-02-08 | Discharge: 2021-02-08 | Disposition: A | Discharge status: Home / Self Care | Payer: Medicare Other | Attending: Emergency Medicine | Admitting: Emergency Medicine

## 2021-02-08 DIAGNOSIS — J302 Other seasonal allergic rhinitis: Secondary | ICD-10-CM

## 2021-02-08 MED ORDER — BENZONATATE 100 MG PO CAPS: 100.0000 mg | ORAL_CAPSULE | Freq: Three times a day (TID) | ORAL | 0 refills | Status: DC

## 2021-02-08 MED ORDER — LEVOCETIRIZINE DIHYDROCHLORIDE 5 MG PO TABS: 5.0000 mg | ORAL_TABLET | Freq: Every evening | ORAL | 0 refills | Status: DC

## 2021-02-08 MED ORDER — FLUTICASONE PROPIONATE 50 MCG/ACT NA SUSP: 2.0000 | Freq: Every day | NASAL | 1 refills | Status: DC

## 2021-02-08 MED ORDER — ALBUTEROL SULFATE HFA 108 (90 BASE) MCG/ACT IN AERS: 2.0000 | INHALATION_SPRAY | RESPIRATORY_TRACT | 0 refills | Status: DC | PRN

## 2021-02-08 NOTE — Discharge Instructions (Addendum)
Take xyzal every day before bedtime  Can use 2 puffs of nasal spray daily  Can take pill for coughing three times a day  Can use 2 puffs of inhaler for chest tightness every 4 hours as needed  Can follow up with primary care provider if no relief or decrease of symptoms

## 2021-02-08 NOTE — ED Triage Notes (Signed)
Pt presents with cough chest and nasal congestion x 3 weeks. Zyrtec gives no relief.

## 2021-02-08 NOTE — ED Provider Notes (Signed)
[facility]    CSN: [id number] Arrival date & time: 02/08/21  0847      History   Chief Complaint Chief Complaint  Patient presents with  . Cough  . Nasal Congestion    HPI Kathryn Mendez is a 70 y.o. female.   Patient presents with non productive cough, nasal congestion, rhinorrhea, sneezing, chest tightness for 3 weeks. Denies fever, headaches, sore throat, shortness of breath, chest pain. Taking zyrtec daily with no relief.   Past Medical History:  Diagnosis Date  . High cholesterol   . Hypertension     Patient Active Problem List   Diagnosis Date Noted  . 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 Snoring 12/14/2018  . Muscle strain 11/04/2018  . Essential hypertension 09/10/2018  . Mixed hyperlipidemia 09/10/2018  . Allergic rhinitis   . Uterine leiomyoma 08/19/2016  . Hyperglycemia 02/11/2013    Past Surgical History:  Procedure Laterality Date  . CHOLECYSTECTOMY    . TUBAL LIGATION      OB History   No obstetric history on file.      Home Medications    Prior to Admission medications   Medication Sig Start Date End Date Taking? Authorizing Provider  albuterol (VENTOLIN HFA) 108 (90 Base) MCG/ACT inhaler Inhale 2 puffs into the lungs every 4 (four) hours as needed for wheezing or shortness of breath. 02/08/21  Yes Chaun Uemura R, NP  benzonatate (TESSALON) 100 MG capsule Take 1 capsule (100 mg total) by mouth every 8 (eight) hours. 02/08/21  Yes Damariz Paganelli R, NP  fluticasone (FLONASE) 50 MCG/ACT nasal spray Place 2 sprays into both nostrils daily. 02/08/21  Yes Doreene Forrey R, NP  levocetirizine (XYZAL) 5 MG tablet Take 1 tablet (5 mg total) by mouth every evening. 02/08/21  Yes Val Farnam, Vincente Liberty R, NP  allopurinol (ZYLOPRIM) 100 MG tablet TAKE 1 TABLET(100 MG) BY MOUTH DAILY 12/22/20   Glendale Chard, MD  amLODipine (NORVASC) 5 MG tablet TAKE 1 TABLET(5 MG) BY MOUTH DAILY 09/02/20   Glendale Chard, MD  aspirin 81 MG tablet  Take 81 mg by mouth daily.    [provider]  colchicine 0.6 MG tablet Take 1 tablet two times a day x 3 days with exacerbations 02/10/20   Minette Brine, FNP  EPINEPHrine (EPIPEN 2-PAK) 0.3 mg/0.3 mL IJ SOAJ injection Inject 0.3 mg into the muscle as needed for anaphylaxis. 11/15/20   Glendale Chard, MD  hydrochlorothiazide (HYDRODIURIL) 25 MG tablet TAKE 1 TABLET(25 MG) BY MOUTH DAILY 11/15/20   Glendale Chard, MD  metoprolol succinate (TOPROL-XL) 100 MG 24 hr tablet TAKE 1 TABLET(100 MG) BY MOUTH DAILY WITH OR IMMEDIATELY FOLLOWING A MEAL 06/20/20   Glendale Chard, MD  montelukast (SINGULAIR) 10 MG tablet TAKE 1 TABLET(10 MG) BY MOUTH AT BEDTIME 06/30/20   Cannady, Jolene T, NP  pravastatin (PRAVACHOL) 40 MG tablet TAKE 1 TABLET(40 MG) BY MOUTH EVERY EVENING 09/25/20   Glendale Chard, MD    Family History Family History  Problem Relation Age of Onset  . Diabetes Mother   . Hypertension Mother   . Diabetes Sister     Social History Social History   Tobacco Use  . Smoking status: Never Smoker  . Smokeless tobacco: Never Used  Vaping Use  . Vaping Use: Never used  Substance Use Topics  . Alcohol use: Not Currently    Comment: occasional  . Drug use: No     Allergies   Other   Review of Systems  Review of Systems  Constitutional: Negative.   HENT: Positive for congestion, rhinorrhea and sneezing. Negative for dental problem, drooling, ear discharge, ear pain, facial swelling, hearing loss, mouth sores, nosebleeds, postnasal drip, sinus pressure, sinus pain, sore throat, tinnitus, trouble swallowing and voice change.   Eyes: Negative.   Respiratory: Positive for cough and shortness of breath. Negative for apnea, choking, chest tightness, wheezing and stridor.   Cardiovascular: Negative.   Gastrointestinal: Negative.   Skin: Negative.   Neurological: Negative.      Physical Exam Triage Vital Signs ED Triage Vitals  Enc Vitals Group     BP 02/08/21 1003 (!) 150/77      Pulse Rate 02/08/21 1003 70     Resp 02/08/21 1003 18     Temp 02/08/21 1003 98.3 F (36.8 C)     Temp Source 02/08/21 1003 Oral     SpO2 02/08/21 1003 97 %     Weight --      Height --      Head Circumference --      Peak Flow --      Pain Score 02/08/21 1002 0     Pain Loc --      Pain Edu? --      Excl. in [place]? --    No data found.  Updated Vital Signs BP (!) 150/77 (BP Location: Left Arm)   Pulse 70   Temp 98.3 F (36.8 C) (Oral)   Resp 18   SpO2 97%   Visual Acuity Right Eye Distance:   Left Eye Distance:   Bilateral Distance:    Right Eye Near:   Left Eye Near:    Bilateral Near:     Physical Exam Constitutional:      Appearance: Normal appearance. She is normal weight.  HENT:     Head: Normocephalic.     Right Ear: Tympanic membrane, ear canal and external ear normal.     Left Ear: Tympanic membrane, ear canal and external ear normal.     Nose: Congestion and rhinorrhea present.     Mouth/Throat:     Mouth: Mucous membranes are moist.     Pharynx: Oropharynx is clear.  Eyes:     Extraocular Movements: Extraocular movements intact.     Conjunctiva/sclera: Conjunctivae normal.     Pupils: Pupils are equal, round, and reactive to light.  Cardiovascular:     Rate and Rhythm: Normal rate and regular rhythm.     Pulses: Normal pulses.     Heart sounds: Normal heart sounds.  Pulmonary:     Effort: Pulmonary effort is normal.     Breath sounds: Normal breath sounds.  Musculoskeletal:     Cervical back: Normal range of motion.  Skin:    General: Skin is warm and dry.  Neurological:     Mental Status: She is alert and oriented to person, place, and time. Mental status is at baseline.  Psychiatric:        Mood and Affect: Mood normal.        Behavior: Behavior normal.        Thought Content: Thought content normal.        Judgment: Judgment normal.      UC Treatments / Results  Labs (all labs ordered are listed, but only abnormal results are  displayed) Labs Reviewed - No data to display  EKG   Radiology No results found.  Procedures Procedures (including critical care time)  Medications Ordered in UC Medications -  No data to display  Initial Impression / Assessment and Plan / UC Course  I have reviewed the triage vital signs and the nursing notes.  Pertinent labs & imaging results that were available during my care of the patient were reviewed by me and considered in my medical decision making (see chart for details).  Seasonal allergies  1. xyzal 5 mg daily 2. flonase 2 puffs daily 3. Tessalon 100 mg tid 4. Albuterol inhaler 2 puffs every 4 hours prn Final Clinical Impressions(s) / UC Diagnoses   Final diagnoses:  Seasonal allergies     Discharge Instructions     Take xyzal every day before bedtime  Can use 2 puffs of nasal spray daily  Can take pill for coughing three times a day  Can use 2 puffs of inhaler for chest tightness every 4 hours as needed  Can follow up with primary care provider if no relief or decrease of symptoms    ED Prescriptions    Medication Sig Dispense Auth. Provider   levocetirizine (XYZAL) 5 MG tablet Take 1 tablet (5 mg total) by mouth every evening. 30 tablet Jaiyanna Safran R, NP   fluticasone (FLONASE) 50 MCG/ACT nasal spray Place 2 sprays into both nostrils daily. 11.1 mL Garet Hooton R, NP   benzonatate (TESSALON) 100 MG capsule Take 1 capsule (100 mg total) by mouth every 8 (eight) hours. 21 capsule Antonina Deziel R, NP   albuterol (VENTOLIN HFA) 108 (90 Base) MCG/ACT inhaler Inhale 2 puffs into the lungs every 4 (four) hours as needed for wheezing or shortness of breath. 18 g Hans Eden, NP     PDMP not reviewed this encounter.   Hans Eden, NP 02/08/21 1025

## 2021-03-15 ENCOUNTER — Ambulatory Visit (INDEPENDENT_AMBULATORY_CARE_PROVIDER_SITE_OTHER): Payer: Medicare Other | Admitting: Internal Medicine

## 2021-03-15 ENCOUNTER — Other Ambulatory Visit: Payer: Self-pay

## 2021-03-15 ENCOUNTER — Encounter: Payer: Self-pay | Admitting: Internal Medicine

## 2021-03-15 VITALS — BP 134/82 | HR 63 | Temp 97.8°F | Ht 62.6 in | Wt 178.8 lb

## 2021-03-15 DIAGNOSIS — E7800 Pure hypercholesterolemia, unspecified: Secondary | ICD-10-CM

## 2021-03-15 DIAGNOSIS — Z6832 Body mass index (BMI) 32.0-32.9, adult: Secondary | ICD-10-CM | POA: Diagnosis not present

## 2021-03-15 DIAGNOSIS — E6609 Other obesity due to excess calories: Secondary | ICD-10-CM | POA: Diagnosis not present

## 2021-03-15 DIAGNOSIS — E559 Vitamin D deficiency, unspecified: Secondary | ICD-10-CM | POA: Diagnosis not present

## 2021-03-15 DIAGNOSIS — I10 Essential (primary) hypertension: Secondary | ICD-10-CM | POA: Diagnosis not present

## 2021-03-15 MED ORDER — AMLODIPINE BESYLATE 5 MG PO TABS: ORAL_TABLET | ORAL | 2 refills | Status: DC

## 2021-03-15 NOTE — Progress Notes (Signed)
I,Katawbba Wiggins,acting as a Education administrator for [name], MD.,have documented all relevant documentation on the behalf of [name], MD,as directed by  [name], MD while in the presence of [name], MD.  This visit occurred during the SARS-CoV-2 public health emergency.  Safety protocols were in place, including screening questions prior to the visit, additional usage of staff PPE, and extensive cleaning of exam room while observing appropriate contact time as indicated for disinfecting solutions.  Subjective:     Patient ID: Kathryn Mendez , female    DOB: May 11, 1951 , 70 y.o.   MRN: [id number]   Chief Complaint  Patient presents with  . Hypertension  . Hyperlipidemia    HPI  The patient is here today for a blood pressure and cholesterol follow-up.  She reports compliance with meds. She denies headaches, chest pain and shortness of breath.   Hypertension This is a chronic problem. The current episode started more than 1 year ago. The problem has been gradually improving since onset. The problem is controlled. Pertinent negatives include no blurred vision, chest pain, palpitations or shortness of breath. Risk factors for coronary artery disease include dyslipidemia, post-menopausal state, sedentary lifestyle and obesity. The current treatment provides moderate improvement. Hypertensive end-organ damage includes kidney disease.     Past Medical History:  Diagnosis Date  . High cholesterol   . Hypertension      Family History  Problem Relation Age of Onset  . Diabetes Mother   . Hypertension Mother   . Diabetes Sister      Current Outpatient Medications:  .  albuterol (VENTOLIN HFA) 108 (90 Base) MCG/ACT inhaler, Inhale 2 puffs into the lungs every 4 (four) hours as needed for wheezing or shortness of breath., Disp: 18 g, Rfl: 0 .  allopurinol (ZYLOPRIM) 100 MG tablet, TAKE 1 TABLET(100 MG) BY MOUTH DAILY, Disp: 90 tablet, Rfl: 0 .  aspirin 81 MG  tablet, Take 81 mg by mouth daily. Some times, maybe 2 times per week, Disp: , Rfl:  .  fluticasone (FLONASE) 50 MCG/ACT nasal spray, Place 2 sprays into both nostrils daily., Disp: 11.1 mL, Rfl: 1 .  hydrochlorothiazide (HYDRODIURIL) 25 MG tablet, TAKE 1 TABLET(25 MG) BY MOUTH DAILY, Disp: 90 tablet, Rfl: 1 .  metoprolol succinate (TOPROL-XL) 100 MG 24 hr tablet, TAKE 1 TABLET(100 MG) BY MOUTH DAILY WITH OR IMMEDIATELY FOLLOWING A MEAL, Disp: 90 tablet, Rfl: 1 .  pravastatin (PRAVACHOL) 40 MG tablet, TAKE 1 TABLET(40 MG) BY MOUTH EVERY EVENING, Disp: 90 tablet, Rfl: 1 .  amLODipine (NORVASC) 5 MG tablet, TAKE 1 TABLET(5 MG) BY MOUTH DAILY, Disp: 90 tablet, Rfl: 2 .  benzonatate (TESSALON) 100 MG capsule, Take 1 capsule (100 mg total) by mouth every 8 (eight) hours. (Patient not taking: Reported on 03/15/2021), Disp: 21 capsule, Rfl: 0 .  colchicine 0.6 MG tablet, Take 1 tablet two times a day x 3 days with exacerbations (Patient not taking: Reported on 03/15/2021), Disp: 30 tablet, Rfl: 2 .  EPINEPHrine (EPIPEN 2-PAK) 0.3 mg/0.3 mL IJ SOAJ injection, Inject 0.3 mg into the muscle as needed for anaphylaxis. (Patient not taking: Reported on 03/15/2021), Disp: 1 each, Rfl: 1 .  levocetirizine (XYZAL) 5 MG tablet, Take 1 tablet (5 mg total) by mouth every evening. (Patient not taking: Reported on 03/15/2021), Disp: 30 tablet, Rfl: 0 .  montelukast (SINGULAIR) 10 MG tablet, TAKE 1 TABLET(10 MG) BY MOUTH AT BEDTIME (Patient not taking: Reported on 03/15/2021), Disp: 90 tablet,  Rfl: 4   Allergies  Allergen Reactions  . Other     Food allergies     Review of Systems  Constitutional: Negative.   Eyes: Negative for blurred vision.  Respiratory: Negative.  Negative for shortness of breath.   Cardiovascular: Negative.  Negative for chest pain and palpitations.  Gastrointestinal: Negative.   Psychiatric/Behavioral: Negative.   All other systems reviewed and are negative.    Today's Vitals   03/15/21 1040   BP: 134/82  Pulse: 63  Temp: 97.8 F (36.6 C)  TempSrc: Oral  Weight: 178 lb 12.8 oz (81.1 kg)  Height: 5' 2.6" (1.59 m)  PainSc: 2   PainLoc: Leg   Body mass index is 32.08 kg/m.  Wt Readings from Last 3 Encounters:  03/15/21 178 lb 12.8 oz (81.1 kg)  11/15/20 173 lb (78.5 kg)  09/20/20 169 lb 6.4 oz (76.8 kg)   BP Readings from Last 3 Encounters:  03/15/21 134/82  02/08/21 (!) 150/77  11/15/20 124/70   Objective:  Physical Exam Vitals and nursing note reviewed.  Constitutional:      Appearance: Normal appearance. She is obese.  HENT:     Head: Normocephalic and atraumatic.     Nose:     Comments: Masked     Mouth/Throat:     Comments: Masked  Eyes:     Extraocular Movements: Extraocular movements intact.  Cardiovascular:     Rate and Rhythm: Normal rate and regular rhythm.     Heart sounds: Normal heart sounds.  Pulmonary:     Effort: Pulmonary effort is normal.     Breath sounds: Normal breath sounds.  Musculoskeletal:     Cervical back: Normal range of motion.  Skin:    General: Skin is warm.  Neurological:     General: No focal deficit present.     Mental Status: She is alert.  Psychiatric:        Mood and Affect: Mood normal.        Behavior: Behavior normal.         Assessment And Plan:     1. Essential hypertension Comments: Chronic, controlled. Advised to follow low sodium diet. I will check renal function today.  She will f/u in six months for re-evaluation.  - Lipid panel - CMP14+EGFR - CBC no Diff  2. Pure hypercholesterolemia Comments: Chronic, I will check lipid panel. Advised to take meds, limit fried food intake, increase fiber intake and daily activity. Aim for 150 min exercise/week. - Lipid panel - CMP14+EGFR - TSH  3. Vitamin D insufficiency Comments: I will check vitamin D level and supplement as needed.  - Vitamin D (25 hydroxy)  4. Class 1 obesity due to excess calories with serious comorbidity and body mass index (BMI)  of 32.0 to 32.9 in adult  She is encouraged to strive for BMI less than 30 to decrease cardiac risk. Advised to aim for at least 150 minutes of exercise per week.   Patient was given opportunity to ask questions. Patient verbalized understanding of the plan and was able to repeat key elements of the plan. All questions were answered to their satisfaction.   I, [name], MD, have reviewed all documentation for this visit. The documentation on 03/15/21 for the exam, diagnosis, procedures, and orders are all accurate and complete.   IF YOU HAVE BEEN REFERRED TO A SPECIALIST, IT MAY TAKE 1-2 WEEKS TO SCHEDULE/PROCESS THE REFERRAL. IF YOU HAVE NOT HEARD FROM US/SPECIALIST IN TWO WEEKS, PLEASE  GIVE [place] A CALL AT [phone].   THE PATIENT IS ENCOURAGED TO PRACTICE SOCIAL DISTANCING DUE TO THE COVID-19 PANDEMIC.

## 2021-03-15 NOTE — Patient Instructions (Signed)

## 2021-03-16 LAB — CBC
Hematocrit: 39.3 % (ref 34.0–46.6)
Hemoglobin: 13 g/dL (ref 11.1–15.9)
MCH: 28 pg (ref 26.6–33.0)
MCHC: 33.1 g/dL (ref 31.5–35.7)
MCV: 85 fL (ref 79–97)
Platelets: 352 10*3/uL (ref 150–450)
RBC: 4.64 x10E6/uL (ref 3.77–5.28)
RDW: 13.2 % (ref 11.7–15.4)
WBC: 4.9 10*3/uL (ref 3.4–10.8)

## 2021-03-16 LAB — CMP14+EGFR
ALT: 21 IU/L (ref 0–32)
AST: 19 IU/L (ref 0–40)
Albumin/Globulin Ratio: 1.7 (ref 1.2–2.2)
Albumin: 4 g/dL (ref 3.8–4.8)
Alkaline Phosphatase: 81 IU/L (ref 44–121)
BUN/Creatinine Ratio: 15 (ref 12–28)
BUN: 14 mg/dL (ref 8–27)
Bilirubin Total: 0.6 mg/dL (ref 0.0–1.2)
CO2: 26 mmol/L (ref 20–29)
Calcium: 9.6 mg/dL (ref 8.7–10.3)
Chloride: 101 mmol/L (ref 96–106)
Creatinine, Ser: 0.92 mg/dL (ref 0.57–1.00)
Globulin, Total: 2.4 g/dL (ref 1.5–4.5)
Glucose: 92 mg/dL (ref 65–99)
Potassium: 3.3 mmol/L — ABNORMAL LOW (ref 3.5–5.2)
Sodium: 140 mmol/L (ref 134–144)
Total Protein: 6.4 g/dL (ref 6.0–8.5)
eGFR: 67 mL/min/{1.73_m2} (ref >59)

## 2021-03-16 LAB — LIPID PANEL
Chol/HDL Ratio: 2.6 ratio (ref 0.0–4.4)
Cholesterol, Total: 197 mg/dL (ref 100–199)
HDL: 76 mg/dL (ref >39)
LDL Chol Calc (NIH): 110 mg/dL — ABNORMAL HIGH (ref 0–99)
Triglycerides: 58 mg/dL (ref 0–149)
VLDL Cholesterol Cal: 11 mg/dL (ref 5–40)

## 2021-03-16 LAB — VITAMIN D 25 HYDROXY (VIT D DEFICIENCY, FRACTURES): Vit D, 25-Hydroxy: 27.5 ng/mL — ABNORMAL LOW (ref 30.0–100.0)

## 2021-03-16 LAB — TSH: TSH: 2.65 u[IU]/mL (ref 0.450–4.500)

## 2021-03-23 ENCOUNTER — Other Ambulatory Visit: Payer: Self-pay

## 2021-03-23 DIAGNOSIS — E559 Vitamin D deficiency, unspecified: Secondary | ICD-10-CM

## 2021-03-23 MED ORDER — VITAMIN D (ERGOCALCIFEROL) 1.25 MG (50000 UNIT) PO CAPS: ORAL_CAPSULE | ORAL | 0 refills | Status: DC

## 2021-05-16 ENCOUNTER — Other Ambulatory Visit: Payer: Self-pay | Admitting: Internal Medicine

## 2021-05-20 ENCOUNTER — Other Ambulatory Visit: Payer: Self-pay | Admitting: Internal Medicine

## 2021-06-12 ENCOUNTER — Other Ambulatory Visit: Payer: Self-pay | Admitting: Internal Medicine

## 2021-06-12 DIAGNOSIS — E559 Vitamin D deficiency, unspecified: Secondary | ICD-10-CM

## 2021-06-20 ENCOUNTER — Other Ambulatory Visit: Payer: Self-pay | Admitting: Internal Medicine

## 2021-07-23 DIAGNOSIS — Z23 Encounter for immunization: Secondary | ICD-10-CM | POA: Diagnosis not present

## 2021-08-02 ENCOUNTER — Other Ambulatory Visit: Payer: Self-pay

## 2021-08-02 ENCOUNTER — Ambulatory Visit (HOSPITAL_COMMUNITY)
Admission: EM | Admit: 2021-08-02 | Discharge: 2021-08-02 | Disposition: A | Discharge status: Home / Self Care | Payer: Medicare Other | Attending: Physician Assistant | Admitting: Physician Assistant

## 2021-08-02 ENCOUNTER — Encounter (HOSPITAL_COMMUNITY): Payer: Self-pay | Admitting: Emergency Medicine

## 2021-08-02 DIAGNOSIS — Z20822 Contact with and (suspected) exposure to covid-19: Secondary | ICD-10-CM | POA: Diagnosis not present

## 2021-08-02 DIAGNOSIS — Z7982 Long term (current) use of aspirin: Secondary | ICD-10-CM | POA: Diagnosis not present

## 2021-08-02 DIAGNOSIS — J069 Acute upper respiratory infection, unspecified: Secondary | ICD-10-CM | POA: Diagnosis not present

## 2021-08-02 DIAGNOSIS — Z79899 Other long term (current) drug therapy: Secondary | ICD-10-CM | POA: Insufficient documentation

## 2021-08-02 DIAGNOSIS — R051 Acute cough: Secondary | ICD-10-CM | POA: Insufficient documentation

## 2021-08-02 LAB — POC INFLUENZA A AND B ANTIGEN (URGENT CARE ONLY)
INFLUENZA A ANTIGEN, POC: NEGATIVE
INFLUENZA B ANTIGEN, POC: NEGATIVE

## 2021-08-02 LAB — SARS CORONAVIRUS 2 (TAT 6-24 HRS): SARS Coronavirus 2: NEGATIVE

## 2021-08-02 NOTE — ED Provider Notes (Signed)
[facility]    CSN: [id number] Arrival date & time: 08/02/21  5277      History   Chief Complaint Chief Complaint  Patient presents with   Cough    congestion    HPI Kathryn Mendez is a 70 y.o. female.   Patient here today for evaluation of cough, congestion, and itchy throat that started a few days ago.  She has had some headache but no body aches.  She reports that she has not had fever or chills.  She has not had any nausea, vomiting, diarrhea.  She does not report any sick contacts.  The history is provided by the patient.  Cough Associated symptoms: sore throat   Associated symptoms: no chills, no ear pain, no eye discharge, no fever, no shortness of breath and no wheezing    Past Medical History:  Diagnosis Date   High cholesterol    Hypertension     Patient Active Problem List   Diagnosis Date Noted   Chronic renal disease, stage II 12/14/2018   Snoring 12/14/2018   Muscle strain 11/04/2018   Essential hypertension 09/10/2018   Mixed hyperlipidemia 09/10/2018   Allergic rhinitis    Uterine leiomyoma 08/19/2016   Hyperglycemia 02/11/2013    Past Surgical History:  Procedure Laterality Date   CHOLECYSTECTOMY     TUBAL LIGATION      OB History   No obstetric history on file.      Home Medications    Prior to Admission medications   Medication Sig Start Date End Date Taking? Authorizing Provider  albuterol (VENTOLIN HFA) 108 (90 Base) MCG/ACT inhaler Inhale 2 puffs into the lungs every 4 (four) hours as needed for wheezing or shortness of breath. 02/08/21   Hans Eden, NP  allopurinol (ZYLOPRIM) 100 MG tablet TAKE 1 TABLET(100 MG) BY MOUTH DAILY 06/20/21   Glendale Chard, MD  amLODipine (NORVASC) 5 MG tablet TAKE 1 TABLET(5 MG) BY MOUTH DAILY 03/15/21   Glendale Chard, MD  aspirin 81 MG tablet Take 81 mg by mouth daily. Some times, maybe 2 times per week    [provider]  benzonatate (TESSALON) 100 MG capsule Take 1  capsule (100 mg total) by mouth every 8 (eight) hours. Patient not taking: No sig reported 02/08/21   Hans Eden, NP  colchicine 0.6 MG tablet Take 1 tablet two times a day x 3 days with exacerbations Patient not taking: No sig reported 02/10/20   Minette Brine, FNP  EPINEPHrine (EPIPEN 2-PAK) 0.3 mg/0.3 mL IJ SOAJ injection Inject 0.3 mg into the muscle as needed for anaphylaxis. Patient not taking: Reported on 03/15/2021 11/15/20   Glendale Chard, MD  fluticasone [facility]) 50 MCG/ACT nasal spray Place 2 sprays into both nostrils daily. 02/08/21   White, Leitha Schuller, NP  hydrochlorothiazide (HYDRODIURIL) 25 MG tablet TAKE 1 TABLET(25 MG) BY MOUTH DAILY 05/18/21   Glendale Chard, MD  levocetirizine (XYZAL) 5 MG tablet Take 1 tablet (5 mg total) by mouth every evening. Patient not taking: Reported on 03/15/2021 02/08/21   Hans Eden, NP  metoprolol succinate (TOPROL-XL) 100 MG 24 hr tablet TAKE 1 TABLET(100 MG) BY MOUTH DAILY WITH OR IMMEDIATELY FOLLOWING A MEAL 05/21/21   Glendale Chard, MD  montelukast (SINGULAIR) 10 MG tablet TAKE 1 TABLET(10 MG) BY MOUTH AT BEDTIME Patient not taking: Reported on 03/15/2021 06/30/20   Marnee Guarneri T, NP  pravastatin (PRAVACHOL) 40 MG tablet TAKE 1 TABLET(40 MG) BY MOUTH EVERY EVENING 09/25/20  Glendale Chard, MD  Vitamin D, Ergocalciferol, (DRISDOL) 1.25 MG (50000 UNIT) CAPS capsule TAKE 1 CAPSULE BY MOUTH ON TUEDAYS AND FRIDAYS 06/12/21   Glendale Chard, MD    Family History Family History  Problem Relation Age of Onset   Diabetes Mother    Hypertension Mother    Diabetes Sister     Social History Social History   Tobacco Use   Smoking status: Never   Smokeless tobacco: Never  Vaping Use   Vaping Use: Never used  Substance Use Topics   Alcohol use: Not Currently    Comment: occasional   Drug use: No     Allergies   Other   Review of Systems Review of Systems  Constitutional:  Negative for chills and fever.  HENT:  Positive for  congestion, sinus pressure and sore throat. Negative for ear pain.   Eyes:  Negative for discharge and redness.  Respiratory:  Positive for cough. Negative for shortness of breath and wheezing.   Gastrointestinal:  Negative for abdominal pain, diarrhea, nausea and vomiting.    Physical Exam Triage Vital Signs ED Triage Vitals  Enc Vitals Group     BP 08/02/21 0906 (!) 163/79     Pulse Rate 08/02/21 0906 68     Resp 08/02/21 0906 18     Temp 08/02/21 0906 98.7 F (37.1 C)     Temp src --      SpO2 08/02/21 0906 97 %     Weight --      Height --      Head Circumference --      Peak Flow --      Pain Score 08/02/21 0903 0     Pain Loc --      Pain Edu? --      Excl. in [place]? --    No data found.  Updated Vital Signs BP (!) 163/79   Pulse 68   Temp 98.7 F (37.1 C)   Resp 18   SpO2 97%      Physical Exam Vitals and nursing note reviewed.  Constitutional:      General: She is not in acute distress.    Appearance: Normal appearance. She is not ill-appearing.  HENT:     Head: Normocephalic and atraumatic.     Right Ear: Tympanic membrane normal.     Left Ear: Tympanic membrane normal.     Nose: Congestion present.     Mouth/Throat:     Mouth: Mucous membranes are moist.     Pharynx: No oropharyngeal exudate or posterior oropharyngeal erythema.  Eyes:     Conjunctiva/sclera: Conjunctivae normal.  Cardiovascular:     Rate and Rhythm: Normal rate and regular rhythm.     Heart sounds: Normal heart sounds. No murmur heard. Pulmonary:     Effort: Pulmonary effort is normal. No respiratory distress.     Breath sounds: Normal breath sounds. No wheezing, rhonchi or rales.  Skin:    General: Skin is warm and dry.  Neurological:     Mental Status: She is alert.  Psychiatric:        Mood and Affect: Mood normal.        Thought Content: Thought content normal.     UC Treatments / Results  Labs (all labs ordered are listed, but only abnormal results are displayed) Labs  Reviewed  SARS CORONAVIRUS 2 (TAT 6-24 HRS)  POC INFLUENZA A AND B ANTIGEN (URGENT CARE ONLY)    EKG   Radiology  No results found.  Procedures Procedures (including critical care time)  Medications Ordered in UC Medications - No data to display  Initial Impression / Assessment and Plan / UC Course  I have reviewed the triage vital signs and the nursing notes.  Pertinent labs & imaging results that were available during my care of the patient were reviewed by me and considered in my medical decision making (see chart for details).  Flu screening ordered and was negative.  Suspect other viral etiology, and COVID screening ordered.  Recommended symptomatic treatment, and rest.  Encouraged follow-up with any further concerns or questions.  Final Clinical Impressions(s) / UC Diagnoses   Final diagnoses:  Viral URI with cough  Viral upper respiratory tract infection     Discharge Instructions      Flu test was negative. I suspect viral cause as discussed. Recommend symptomatic treatment and rest. Follow up with any further concerns. Will notify of Covid results if positive.      ED Prescriptions   None    PDMP not reviewed this encounter.   Francene Finders, PA-C 08/02/21 [phone]

## 2021-08-02 NOTE — ED Triage Notes (Signed)
Pt is present today with cough, nasal congestion, and itch throat. Pt states sx started x3 days ago.

## 2021-08-02 NOTE — Discharge Instructions (Addendum)
Flu test was negative. I suspect viral cause as discussed. Recommend symptomatic treatment and rest. Follow up with any further concerns. Will notify of Covid results if positive.

## 2021-08-22 ENCOUNTER — Other Ambulatory Visit: Payer: Self-pay | Admitting: Internal Medicine

## 2021-08-22 DIAGNOSIS — Z1231 Encounter for screening mammogram for malignant neoplasm of breast: Secondary | ICD-10-CM

## 2021-09-17 ENCOUNTER — Other Ambulatory Visit: Payer: Self-pay

## 2021-09-17 ENCOUNTER — Ambulatory Visit (INDEPENDENT_AMBULATORY_CARE_PROVIDER_SITE_OTHER): Payer: Medicare Other | Admitting: Internal Medicine

## 2021-09-17 ENCOUNTER — Encounter: Payer: Self-pay | Admitting: Internal Medicine

## 2021-09-17 VITALS — BP 132/70 | HR 64 | Temp 97.9°F | Ht 62.6 in | Wt 168.8 lb

## 2021-09-17 DIAGNOSIS — I10 Essential (primary) hypertension: Secondary | ICD-10-CM | POA: Diagnosis not present

## 2021-09-17 DIAGNOSIS — E6609 Other obesity due to excess calories: Secondary | ICD-10-CM | POA: Diagnosis not present

## 2021-09-17 DIAGNOSIS — E7800 Pure hypercholesterolemia, unspecified: Secondary | ICD-10-CM

## 2021-09-17 DIAGNOSIS — E559 Vitamin D deficiency, unspecified: Secondary | ICD-10-CM

## 2021-09-17 DIAGNOSIS — Z6830 Body mass index (BMI) 30.0-30.9, adult: Secondary | ICD-10-CM

## 2021-09-17 DIAGNOSIS — M1A2790 Drug-induced chronic gout, unspecified ankle and foot, without tophus (tophi): Secondary | ICD-10-CM

## 2021-09-17 MED ORDER — AMLODIPINE BESYLATE 5 MG PO TABS: ORAL_TABLET | ORAL | 2 refills | Status: DC

## 2021-09-17 MED ORDER — ALLOPURINOL 100 MG PO TABS: ORAL_TABLET | ORAL | 2 refills | Status: DC

## 2021-09-17 MED ORDER — PRAVASTATIN SODIUM 40 MG PO TABS: ORAL_TABLET | ORAL | 2 refills | Status: DC

## 2021-09-17 NOTE — Patient Instructions (Signed)

## 2021-09-17 NOTE — Progress Notes (Signed)
I,Tianna Badgett,acting as a Education administrator for [name], MD.,have documented all relevant documentation on the behalf of [name], MD,as directed by  [name], MD while in the presence of [name], MD.  This visit occurred during the SARS-CoV-2 public health emergency.  Safety protocols were in place, including screening questions prior to the visit, additional usage of staff PPE, and extensive cleaning of exam room while observing appropriate contact time as indicated for disinfecting solutions.  Subjective:     Patient ID: Kathryn Mendez , female    DOB: [birth year] , 70 y.o.   MRN: [id number]   Chief Complaint  Patient presents with   Hypertension    HPI  The patient is here today for a blood pressure and cholesterol follow-up.  She reports compliance with meds. She denies headaches, chest pain and shortness of breath.   Hypertension This is a chronic problem. The current episode started more than 1 year ago. The problem has been gradually improving since onset. The problem is controlled. Pertinent negatives include no blurred vision, chest pain, palpitations or shortness of breath. Risk factors for coronary artery disease include dyslipidemia, post-menopausal state, sedentary lifestyle and obesity. The current treatment provides moderate improvement. Hypertensive end-organ damage includes kidney disease.    Past Medical History:  Diagnosis Date   High cholesterol    Hypertension      Family History  Problem Relation Age of Onset   Diabetes Mother    Hypertension Mother    Diabetes Sister      Current Outpatient Medications:    albuterol (VENTOLIN HFA) 108 (90 Base) MCG/ACT inhaler, Inhale 2 puffs into the lungs every 4 (four) hours as needed for wheezing or shortness of breath., Disp: 18 g, Rfl: 0   fluticasone (FLONASE) 50 MCG/ACT nasal spray, Place 2 sprays into both nostrils daily., Disp: 11.1 mL, Rfl: 1   hydrochlorothiazide (HYDRODIURIL) 25 MG  tablet, TAKE 1 TABLET(25 MG) BY MOUTH DAILY, Disp: 90 tablet, Rfl: 1   Vitamin D, Ergocalciferol, (DRISDOL) 1.25 MG (50000 UNIT) CAPS capsule, TAKE 1 CAPSULE BY MOUTH ON TUEDAYS AND FRIDAYS, Disp: 24 capsule, Rfl: 0   allopurinol (ZYLOPRIM) 100 MG tablet, TAKE 1 TABLET(100 MG) BY MOUTH DAILY, Disp: 90 tablet, Rfl: 2   amLODipine (NORVASC) 5 MG tablet, TAKE 1 TABLET(5 MG) BY MOUTH DAILY, Disp: 90 tablet, Rfl: 2   metoprolol succinate (TOPROL-XL) 100 MG 24 hr tablet, TAKE 1 TABLET(100 MG) BY MOUTH DAILY WITH OR IMMEDIATELY FOLLOWING A MEAL, Disp: 90 tablet, Rfl: 1   pravastatin (PRAVACHOL) 40 MG tablet, TAKE 1 TABLET(40 MG) BY MOUTH EVERY EVENING, Disp: 90 tablet, Rfl: 2   Allergies  Allergen Reactions   Other     Food allergies     Review of Systems  Constitutional: Negative.   Eyes:  Negative for blurred vision.  Respiratory: Negative.  Negative for shortness of breath.   Cardiovascular: Negative.  Negative for chest pain and palpitations.  Gastrointestinal: Negative.   Neurological: Negative.   Psychiatric/Behavioral: Negative.      Today's Vitals   09/17/21 1416  BP: 132/70  Pulse: 64  Temp: 97.9 F (36.6 C)  TempSrc: Oral  Weight: 168 lb 12.8 oz (76.6 kg)  Height: 5' 2.6" (1.59 m)   Body mass index is 30.28 kg/m.  Wt Readings from Last 3 Encounters:  09/17/21 168 lb 12.8 oz (76.6 kg)  03/15/21 178 lb 12.8 oz (81.1 kg)  11/15/20 173 lb (78.5 kg)  Objective:  Physical Exam Vitals and nursing note reviewed.  Constitutional:      Appearance: Normal appearance.  HENT:     Head: Normocephalic and atraumatic.     Nose:     Comments: Masked     Mouth/Throat:     Comments: Masked  Eyes:     Extraocular Movements: Extraocular movements intact.  Cardiovascular:     Rate and Rhythm: Normal rate and regular rhythm.     Heart sounds: Normal heart sounds.  Pulmonary:     Effort: Pulmonary effort is normal.     Breath sounds: Normal breath sounds.  Musculoskeletal:      Cervical back: Normal range of motion.  Skin:    General: Skin is warm.  Neurological:     General: No focal deficit present.     Mental Status: She is alert.  Psychiatric:        Mood and Affect: Mood normal.        Behavior: Behavior normal.        Assessment And Plan:     1. Essential hypertension Comments: Chronic, fair control. Goal BP <130/80. Encouraged to follow low sodium diet. NO med changes today. She will f/u in 4 months for re-evaluation.  - CMP14+EGFR  2. Pure hypercholesterolemia Comments: Chronic, on pravastatin. Last LDL 110 ,advised goal is less than 100. Encouraged to increase fiber intake and exercise regularly.   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 I will check uric acid level today. Advised we should stop HCTZ. If we do so, I will likely need to increase her dose of amlodipine.  - Uric acid  4. Vitamin D insufficiency Comments: She is advised to take vitamin D3 2000 IU daily.   5. Class 1 obesity due to excess calories with serious comorbidity and body mass index (BMI) of 30.0 to 30.9 in adult Comments: BMI is acceptable for her demographic. Encouraged to aim for at least 150 minutes of exercise per week.   Patient was given opportunity to ask questions. Patient verbalized understanding of the plan and was able to repeat key elements of the plan. All questions were answered to their satisfaction.   I, [name], MD, have reviewed all documentation for this visit. The documentation on 09/17/21 for the exam, diagnosis, procedures, and orders are all accurate and complete.   IF YOU HAVE BEEN REFERRED TO A SPECIALIST, IT MAY TAKE 1-2 WEEKS TO SCHEDULE/PROCESS THE REFERRAL. IF YOU HAVE NOT HEARD FROM US/SPECIALIST IN TWO WEEKS, PLEASE GIVE [place] A CALL AT [phone].   THE PATIENT IS ENCOURAGED TO  PRACTICE SOCIAL DISTANCING DUE TO THE COVID-19 PANDEMIC.

## 2021-09-18 LAB — CMP14+EGFR
ALT: 18 IU/L (ref 0–32)
AST: 20 IU/L (ref 0–40)
Albumin/Globulin Ratio: 1.5 (ref 1.2–2.2)
Albumin: 4 g/dL (ref 3.8–4.8)
Alkaline Phosphatase: 86 IU/L (ref 44–121)
BUN/Creatinine Ratio: 20 (ref 12–28)
BUN: 17 mg/dL (ref 8–27)
Bilirubin Total: 0.5 mg/dL (ref 0.0–1.2)
CO2: 28 mmol/L (ref 20–29)
Calcium: 9.6 mg/dL (ref 8.7–10.3)
Chloride: 101 mmol/L (ref 96–106)
Creatinine, Ser: 0.87 mg/dL (ref 0.57–1.00)
Globulin, Total: 2.7 g/dL (ref 1.5–4.5)
Glucose: 92 mg/dL (ref 70–99)
Potassium: 3.4 mmol/L — ABNORMAL LOW (ref 3.5–5.2)
Sodium: 144 mmol/L (ref 134–144)
Total Protein: 6.7 g/dL (ref 6.0–8.5)
eGFR: 72 mL/min/{1.73_m2} (ref >59)

## 2021-09-18 LAB — URIC ACID: Uric Acid: 5.8 mg/dL (ref 3.0–7.2)

## 2021-09-24 ENCOUNTER — Other Ambulatory Visit: Payer: Self-pay

## 2021-09-24 MED ORDER — METOPROLOL SUCCINATE ER 100 MG PO TB24: ORAL_TABLET | ORAL | 1 refills | Status: DC

## 2021-09-27 ENCOUNTER — Other Ambulatory Visit: Payer: Self-pay

## 2021-09-27 ENCOUNTER — Ambulatory Visit: Payer: Medicare Other | Admitting: Internal Medicine

## 2021-09-27 ENCOUNTER — Ambulatory Visit (INDEPENDENT_AMBULATORY_CARE_PROVIDER_SITE_OTHER): Payer: Medicare Other

## 2021-09-27 VITALS — BP 128/68 | HR 72 | Temp 98.0°F | Ht 63.0 in | Wt 169.6 lb

## 2021-09-27 DIAGNOSIS — Z23 Encounter for immunization: Secondary | ICD-10-CM

## 2021-09-27 DIAGNOSIS — Z0000 Encounter for general adult medical examination without abnormal findings: Secondary | ICD-10-CM | POA: Diagnosis not present

## 2021-09-27 MED ORDER — PREVNAR 20 0.5 ML IM SUSY: 0.5000 mL | PREFILLED_SYRINGE | INTRAMUSCULAR | 0 refills | Status: AC

## 2021-09-27 MED ORDER — METOPROLOL SUCCINATE ER 100 MG PO TB24: ORAL_TABLET | ORAL | 1 refills | Status: DC

## 2021-09-27 NOTE — Addendum Note (Signed)
Addended by: Kellie Simmering on: 09/27/2021 12:39 PM   Modules accepted: Orders

## 2021-09-27 NOTE — Patient Instructions (Signed)
Kathryn Mendez , Thank you for taking time to come for your Medicare Wellness Visit. I appreciate your ongoing commitment to your health goals. Please review the following plan we discussed and let me know if I can assist you in the future.   Screening recommendations/referrals: Colonoscopy: completed 04/15/2018 Mammogram: completed 11/23/2020 Bone Density: completed 09/04/2017 Recommended yearly ophthalmology/optometry visit for glaucoma screening and checkup Recommended yearly dental visit for hygiene and checkup  Vaccinations: Influenza vaccine: completed 07/23/2021 Pneumococcal vaccine: sent to pharmacy Tdap vaccine: completed 08/02/2013, due 08/03/2023 Shingles vaccine: discussed   Covid-19: 08/14/2020, 01/20/2020, 12/24/2019  Advanced directives: Advance directive discussed with you today. Even though you declined this today please call our office should you change your mind and we can give you the proper paperwork for you to fill out.  Conditions/risks identified: none  Next appointment: Follow up in one year for your annual wellness visit    Preventive Care 65 Years and Older, Female Preventive care refers to lifestyle choices and visits with your health care provider that can promote health and wellness. What does preventive care include? A yearly physical exam. This is also called an annual well check. Dental exams once or twice a year. Routine eye exams. Ask your health care provider how often you should have your eyes checked. Personal lifestyle choices, including: Daily care of your teeth and gums. Regular physical activity. Eating a healthy diet. Avoiding tobacco and drug use. Limiting alcohol use. Practicing safe sex. Taking low-dose aspirin every day. Taking vitamin and mineral supplements as recommended by your health care provider. What happens during an annual well check? The services and screenings done by your health care provider during your annual well check  will depend on your age, overall health, lifestyle risk factors, and family history of disease. Counseling  Your health care provider may ask you questions about your: Alcohol use. Tobacco use. Drug use. Emotional well-being. Home and relationship well-being. Sexual activity. Eating habits. History of falls. Memory and ability to understand (cognition). Work and work Statistician. Reproductive health. Screening  You may have the following tests or measurements: Height, weight, and BMI. Blood pressure. Lipid and cholesterol levels. These may be checked every 5 years, or more frequently if you are over 79 years old. Skin check. Lung cancer screening. You may have this screening every year starting at age 74 if you have a 30-pack-year history of smoking and currently smoke or have quit within the past 15 years. Fecal occult blood test (FOBT) of the stool. You may have this test every year starting at age 38. Flexible sigmoidoscopy or colonoscopy. You may have a sigmoidoscopy every 5 years or a colonoscopy every 10 years starting at age 43. Hepatitis C blood test. Hepatitis B blood test. Sexually transmitted disease (STD) testing. Diabetes screening. This is done by checking your blood sugar (glucose) after you have not eaten for a while (fasting). You may have this done every 1-3 years. Bone density scan. This is done to screen for osteoporosis. You may have this done starting at age 56. Mammogram. This may be done every 1-2 years. Talk to your health care provider about how often you should have regular mammograms. Talk with your health care provider about your test results, treatment options, and if necessary, the need for more tests. Vaccines  Your health care provider may recommend certain vaccines, such as: Influenza vaccine. This is recommended every year. Tetanus, diphtheria, and acellular pertussis (Tdap, Td) vaccine. You may need a Td booster every 10  years. Zoster vaccine. You  may need this after age 80. Pneumococcal 13-valent conjugate (PCV13) vaccine. One dose is recommended after age 16. Pneumococcal polysaccharide (PPSV23) vaccine. One dose is recommended after age 25. Talk to your health care provider about which screenings and vaccines you need and how often you need them. This information is not intended to replace advice given to you by your health care provider. Make sure you discuss any questions you have with your health care provider. Document Released: 11/10/2015 Document Revised: 07/03/2016 Document Reviewed: 08/15/2015 Elsevier Interactive Patient Education  2017 [place] Prevention in the Home Falls can cause injuries. They can happen to people of all ages. There are many things you can do to make your home safe and to help prevent falls. What can I do on the outside of my home? Regularly fix the edges of walkways and driveways and fix any cracks. Remove anything that might make you trip as you walk through a door, such as a raised step or threshold. Trim any bushes or trees on the path to your home. Use bright outdoor lighting. Clear any walking paths of anything that might make someone trip, such as rocks or tools. Regularly check to see if handrails are loose or broken. Make sure that both sides of any steps have handrails. Any raised decks and porches should have guardrails on the edges. Have any leaves, snow, or ice cleared regularly. Use sand or salt on walking paths during winter. Clean up any spills in your garage right away. This includes oil or grease spills. What can I do in the bathroom? Use night lights. Install grab bars by the toilet and in the tub and shower. Do not use towel bars as grab bars. Use non-skid mats or decals in the tub or shower. If you need to sit down in the shower, use a plastic, non-slip stool. Keep the floor dry. Clean up any water that spills on the floor as soon as it happens. Remove soap buildup  in the tub or shower regularly. Attach bath mats securely with double-sided non-slip rug tape. Do not have throw rugs and other things on the floor that can make you trip. What can I do in the bedroom? Use night lights. Make sure that you have a light by your bed that is easy to reach. Do not use any sheets or blankets that are too big for your bed. They should not hang down onto the floor. Have a firm chair that has side arms. You can use this for support while you get dressed. Do not have throw rugs and other things on the floor that can make you trip. What can I do in the kitchen? Clean up any spills right away. Avoid walking on wet floors. Keep items that you use a lot in easy-to-reach places. If you need to reach something above you, use a strong step stool that has a grab bar. Keep electrical cords out of the way. Do not use floor polish or wax that makes floors slippery. If you must use wax, use non-skid floor wax. Do not have throw rugs and other things on the floor that can make you trip. What can I do with my stairs? Do not leave any items on the stairs. Make sure that there are handrails on both sides of the stairs and use them. Fix handrails that are broken or loose. Make sure that handrails are as long as the stairways. Check any carpeting to make sure  that it is firmly attached to the stairs. Fix any carpet that is loose or worn. Avoid having throw rugs at the top or bottom of the stairs. If you do have throw rugs, attach them to the floor with carpet tape. Make sure that you have a light switch at the top of the stairs and the bottom of the stairs. If you do not have them, ask someone to add them for you. What else can I do to help prevent falls? Wear shoes that: Do not have high heels. Have rubber bottoms. Are comfortable and fit you well. Are closed at the toe. Do not wear sandals. If you use a stepladder: Make sure that it is fully opened. Do not climb a closed  stepladder. Make sure that both sides of the stepladder are locked into place. Ask someone to hold it for you, if possible. Clearly mark and make sure that you can see: Any grab bars or handrails. First and last steps. Where the edge of each step is. Use tools that help you move around (mobility aids) if they are needed. These include: Canes. Walkers. Scooters. Crutches. Turn on the lights when you go into a dark area. Replace any light bulbs as soon as they burn out. Set up your furniture so you have a clear path. Avoid moving your furniture around. If any of your floors are uneven, fix them. If there are any pets around you, be aware of where they are. Review your medicines with your doctor. Some medicines can make you feel dizzy. This can increase your chance of falling. Ask your doctor what other things that you can do to help prevent falls. This information is not intended to replace advice given to you by your health care provider. Make sure you discuss any questions you have with your health care provider. Document Released: 08/10/2009 Document Revised: 03/21/2016 Document Reviewed: 11/18/2014 Elsevier Interactive Patient Education  2017 [employer].

## 2021-09-27 NOTE — Progress Notes (Signed)
This visit occurred during the SARS-CoV-2 public health emergency.  Safety protocols were in place, including screening questions prior to the visit, additional usage of staff PPE, and extensive cleaning of exam room while observing appropriate contact time as indicated for disinfecting solutions.  Subjective:   Kathryn Mendez is a 70 y.o. female who presents for Medicare Annual (Subsequent) preventive examination.  Review of Systems     Cardiac Risk Factors include: advanced age (>56men, >12 women);dyslipidemia;hypertension;obesity (BMI >30kg/m2)     Objective:    Today's Vitals   09/27/21 1054  BP: 128/68  Pulse: 72  Temp: 98 F (36.7 C)  TempSrc: Oral  SpO2: 96%  Weight: 169 lb 9.6 oz (76.9 kg)  Height: 5\' 3"  (1.6 m)   Body mass index is 30.04 kg/m.  Advanced Directives 09/27/2021 09/20/2020 09/16/2019 09/10/2018  Does Patient Have a Medical Advance Directive? No No No No  Would patient like information on creating a medical advance directive? - No - Patient declined No - Patient declined Yes (MAU/Ambulatory/Procedural Areas - Information given)    Current Medications (verified) Outpatient Encounter Medications as of 09/27/2021  Medication Sig   albuterol (VENTOLIN HFA) 108 (90 Base) MCG/ACT inhaler Inhale 2 puffs into the lungs every 4 (four) hours as needed for wheezing or shortness of breath.   allopurinol (ZYLOPRIM) 100 MG tablet TAKE 1 TABLET(100 MG) BY MOUTH DAILY   amLODipine (NORVASC) 5 MG tablet TAKE 1 TABLET(5 MG) BY MOUTH DAILY   Cholecalciferol (VITAMIN D) 125 MCG (5000 UT) CAPS Take 1 capsule by mouth daily.   fluticasone (FLONASE) 50 MCG/ACT nasal spray Place 2 sprays into both nostrils daily.   hydrochlorothiazide (HYDRODIURIL) 25 MG tablet TAKE 1 TABLET(25 MG) BY MOUTH DAILY   pneumococcal 20-valent conjugate vaccine (PREVNAR 20) 0.5 ML injection Inject 0.5 mLs into the muscle tomorrow at 10 am for 1 dose.   pravastatin (PRAVACHOL) 40 MG tablet TAKE  1 TABLET(40 MG) BY MOUTH EVERY EVENING   metoprolol succinate (TOPROL-XL) 100 MG 24 hr tablet TAKE 1 TABLET(100 MG) BY MOUTH DAILY WITH OR IMMEDIATELY FOLLOWING A MEAL (Patient not taking: Reported on 09/27/2021)   Vitamin D, Ergocalciferol, (DRISDOL) 1.25 MG (50000 UNIT) CAPS capsule TAKE 1 CAPSULE BY MOUTH ON TUEDAYS AND FRIDAYS (Patient not taking: Reported on 09/27/2021)   No facility-administered encounter medications on file as of 09/27/2021.    Allergies (verified) Other   History: Past Medical History:  Diagnosis Date   High cholesterol    Hypertension    Past Surgical History:  Procedure Laterality Date   CHOLECYSTECTOMY     TUBAL LIGATION     Family History  Problem Relation Age of Onset   Diabetes Mother    Hypertension Mother    Diabetes Sister    Social History   Socioeconomic History   Marital status: Married    Spouse name: Not on file   Number of children: Not on file   Years of education: Not on file   Highest education level: Not on file  Occupational History   Occupation: retired  Tobacco Use   Smoking status: Never   Smokeless tobacco: Never  Vaping Use   Vaping Use: Never used  Substance and Sexual Activity   Alcohol use: Not Currently    Comment: occasional   Drug use: No   Sexual activity: Yes  Other Topics Concern   Not on file  Social History Narrative   Not on file   Social Determinants of [place]  Resource Strain: Low Risk    Difficulty of Paying Living Expenses: Not hard at all  Food Insecurity: Food Insecurity Present   Worried About Charity fundraiser in the Last Year: Sometimes true   Arboriculturist in the Last Year: Sometimes true  Transportation Needs: No Transportation Needs   Lack of Transportation (Medical): No   Lack of Transportation (Non-Medical): No  Physical Activity: Insufficiently Active   Days of Exercise per Week: 3 days   Minutes of Exercise per Session: 40 min  Stress: No Stress Concern Present    Feeling of Stress : Not at all  Social Connections: Not on file    Tobacco Counseling Counseling given: Not Answered   Clinical Intake:  Pre-visit preparation completed: Yes  Pain : No/denies pain     Nutritional Status: BMI > 30  Obese Nutritional Risks: None Diabetes: No  How often do you need to have someone help you when you read instructions, pamphlets, or other written materials from your doctor or pharmacy?: 1 - Never What is the last grade level you completed in school?: 11th grade  Diabetic? no  Interpreter Needed?: No  Information entered by :: NAllen LPN   Activities of Daily Living In your present state of health, do you have any difficulty performing the following activities: 09/27/2021  Hearing? N  Vision? N  Difficulty concentrating or making decisions? N  Walking or climbing stairs? N  Dressing or bathing? N  Doing errands, shopping? N  Preparing Food and eating ? N  Using the Toilet? N  In the past six months, have you accidently leaked urine? N  Do you have problems with loss of bowel control? N  Managing your Medications? N  Managing your Finances? N  Housekeeping or managing your Housekeeping? N  Some recent data might be hidden    Patient Care Team: Glendale Chard, MD as PCP - General (Internal Medicine) Rutherford Guys, MD as Consulting Physician (Ophthalmology)  Indicate any recent Medical Services you may have received from other than [facility] providers in the past year (date may be approximate).     Assessment:   This is a routine wellness examination for Kathryn Mendez.  Hearing/Vision screen Vision Screening - Comments:: Regular eye exams, Dr. Gershon Crane  Dietary issues and exercise activities discussed: Current Exercise Habits: Home exercise routine, Type of exercise: walking, Time (Minutes): 40, Frequency (Times/Week): 3, Weekly Exercise (Minutes/Week): 120   Goals Addressed             This Visit's Progress    Patient Stated        09/27/2021, eat healthier and lose some weight       Depression Screen PHQ 2/9 Scores 09/27/2021 09/20/2020 02/01/2020 09/16/2019 03/15/2019 12/14/2018 11/04/2018  PHQ - 2 Score 0 0 0 0 0 0 0  PHQ- 9 Score - - - 0 - - -    Fall Risk Fall Risk  09/27/2021 09/20/2020 02/01/2020 09/16/2019 03/15/2019  Falls in the past year? 0 0 0 0 0  Comment - - - - -  Risk for fall due to : Medication side effect Medication side effect - Medication side effect -  Follow up Falls evaluation completed;Education provided;Falls prevention discussed Falls evaluation completed;Education provided;Falls prevention discussed - Falls evaluation completed;Education provided;Falls prevention discussed -    FALL RISK PREVENTION PERTAINING TO THE HOME:  Any stairs in or around the home? Yes  If so, are there any without handrails? Yes  Home free of loose throw  rugs in walkways, pet beds, electrical cords, etc? Yes  Adequate lighting in your home to reduce risk of falls? Yes   ASSISTIVE DEVICES UTILIZED TO PREVENT FALLS:  Life alert? No  Use of a cane, walker or w/c? No  Grab bars in the bathroom? No  Shower chair or bench in shower? Yes  Elevated toilet seat or a handicapped toilet? Yes   TIMED UP AND GO:  Was the test performed? No .    Gait steady and fast without use of assistive device  Cognitive Function:     6CIT Screen 09/27/2021 09/20/2020 09/16/2019 09/10/2018  What Year? 0 points 0 points 0 points 0 points  What month? 0 points 0 points 0 points 0 points  What time? 0 points 0 points 0 points 0 points  Count back from 20 0 points 0 points 0 points 0 points  Months in reverse 4 points 4 points 0 points 4 points  Repeat phrase 8 points 2 points 4 points 2 points  Total Score 12 6 4 6     Immunizations Immunization History  Administered Date(s) Administered   Fluad Quad(high Dose 65+) 07/11/2020   Influenza, High Dose Seasonal PF 08/31/2018, 07/16/2019   Influenza-Unspecified 08/28/2018,  07/23/2021   PFIZER(Purple Top)SARS-COV-2 Vaccination 12/24/2019, 01/20/2020, 08/14/2020   Pneumococcal Polysaccharide-23 09/10/2018    TDAP status: Up to date  Flu Vaccine status: Up to date  Pneumococcal vaccine status: Due, Education has been provided regarding the importance of this vaccine. Advised may receive this vaccine at local pharmacy or Health Dept. Aware to provide a copy of the vaccination record if obtained from local pharmacy or Health Dept. Verbalized acceptance and understanding.  Covid-19 vaccine status: Completed vaccines  Qualifies for Shingles Vaccine? Yes   Zostavax completed No   Shingrix Completed?: No.    Education has been provided regarding the importance of this vaccine. Patient has been advised to call insurance company to determine out of pocket expense if they have not yet received this vaccine. Advised may also receive vaccine at local pharmacy or Health Dept. Verbalized acceptance and understanding.  Screening Tests Health Maintenance  Topic Date Due   Pneumonia Vaccine 14+ Years old (2 - PCV) 09/11/2019   COVID-19 Vaccine (4 - Booster for Pfizer series) 10/09/2020   Zoster Vaccines- Shingrix (1 of 2) 12/26/2021 (Originally 07/05/2001)   MAMMOGRAM  11/23/2022   TETANUS/TDAP  08/03/2023   COLONOSCOPY (Pts 45-37yrs Insurance coverage will need to be confirmed)  04/15/2028   INFLUENZA VACCINE  Completed   DEXA SCAN  Completed   Hepatitis C Screening  Completed   HPV VACCINES  Aged Out    Health Maintenance  Health Maintenance Due  Topic Date Due   Pneumonia Vaccine 53+ Years old (2 - PCV) 09/11/2019   COVID-19 Vaccine (4 - Booster for [place] series) 10/09/2020    Colorectal cancer screening: Type of screening: Colonoscopy. Completed 04/15/2018. Repeat every 10 years  Mammogram status: Completed 11/23/2020. Repeat every year  Bone Density status: Completed 09/04/2017.   Lung Cancer Screening: (Low Dose CT Chest recommended if Age 58-80 years, 30  pack-year currently smoking OR have quit w/in 15years.) does not qualify.   Lung Cancer Screening Referral: no  Additional Screening:  Hepatitis C Screening: does qualify; Completed 03/14/2014  Vision Screening: Recommended annual ophthalmology exams for early detection of glaucoma and other disorders of the eye. Is the patient up to date with their annual eye exam?  Yes  Who is the provider or what is  the name of the office in which the patient attends annual eye exams? Dr. Gershon Crane If pt is not established with a provider, would they like to be referred to a provider to establish care? No .   Dental Screening: Recommended annual dental exams for proper oral hygiene  Community Resource Referral / Chronic Care Management: CRR required this visit?  No   CCM required this visit?  No      Plan:     I have personally reviewed and noted the following in the patient's chart:   Medical and social history Use of alcohol, tobacco or illicit drugs  Current medications and supplements including opioid prescriptions.  Functional ability and status Nutritional status Physical activity Advanced directives List of other physicians Hospitalizations, surgeries, and ER visits in previous 12 months Vitals Screenings to include cognitive, depression, and falls Referrals and appointments  In addition, I have reviewed and discussed with patient certain preventive protocols, quality metrics, and best practice recommendations. A written personalized care plan for preventive services as well as general preventive health recommendations were provided to patient.     Kellie Simmering, LPN   [id number]   Nurse Notes: none

## 2021-10-04 DIAGNOSIS — Z23 Encounter for immunization: Secondary | ICD-10-CM | POA: Diagnosis not present

## 2021-10-12 ENCOUNTER — Encounter: Payer: Self-pay | Admitting: Allergy

## 2021-10-12 ENCOUNTER — Ambulatory Visit: Payer: Medicare Other | Admitting: Allergy

## 2021-10-12 ENCOUNTER — Other Ambulatory Visit: Payer: Self-pay

## 2021-10-12 ENCOUNTER — Ambulatory Visit (INDEPENDENT_AMBULATORY_CARE_PROVIDER_SITE_OTHER): Payer: Medicare Other | Admitting: Allergy

## 2021-10-12 VITALS — BP 142/84 | HR 58 | Temp 98.2°F | Resp 16 | Ht 61.0 in | Wt 168.8 lb

## 2021-10-12 DIAGNOSIS — J4520 Mild intermittent asthma, uncomplicated: Secondary | ICD-10-CM | POA: Diagnosis not present

## 2021-10-12 DIAGNOSIS — J3089 Other allergic rhinitis: Secondary | ICD-10-CM

## 2021-10-12 DIAGNOSIS — H1013 Acute atopic conjunctivitis, bilateral: Secondary | ICD-10-CM

## 2021-10-12 DIAGNOSIS — T781XXD Other adverse food reactions, not elsewhere classified, subsequent encounter: Secondary | ICD-10-CM

## 2021-10-12 MED ORDER — TRIAMCINOLONE ACETONIDE 55 MCG/ACT NA AERO: INHALATION_SPRAY | NASAL | 5 refills | Status: DC

## 2021-10-12 MED ORDER — AZELASTINE HCL 0.15 % NA SOLN: NASAL | 5 refills | Status: DC

## 2021-10-12 MED ORDER — OLOPATADINE HCL 0.2 % OP SOLN: OPHTHALMIC | 5 refills | Status: DC

## 2021-10-12 MED ORDER — LEVOCETIRIZINE DIHYDROCHLORIDE 5 MG PO TABS: 5.0000 mg | ORAL_TABLET | Freq: Every evening | ORAL | 5 refills | Status: DC

## 2021-10-12 NOTE — Patient Instructions (Signed)
-   Testing today showed: grasses, weeds, trees, indoor molds, outdoor molds, cat, and tobacco leaf . - Copy of test results provided.  - Avoidance measures provided. - Start taking: Xyzal (levocetirizine) 5mg  tablet once daily.  This is an antihistamine that may work better than zyrtec, Human resources officer and claritin.  Nasacort (triamcinolone) 2 sprays per nostril daily for 1-2 weeks at a time before stopping once nasal congestion improves for maximum benefit Astelin (azelastine) 2 sprays per nostril 1-2 times daily as needed for nasal drainage/throat clearing Olopatadine 1 drop per eye daily as needed for itchy/watery eyes - You can use an extra dose of the antihistamine, if needed, for breakthrough symptoms.  - Consider nasal saline rinses 1-2 times daily to remove allergens from the nasal cavities as well as help with mucous clearance (this is especially helpful to do before the nasal sprays are given) - Consider allergy shots as a means of long-term control. - Allergy shots "re-train" and "reset" the immune system to ignore environmental allergens and decrease the resulting immune response to those allergens (sneezing, itchy watery eyes, runny nose, nasal congestion, etc).    - Allergy shots improve symptoms in 75-85% of patients.  - We can discuss more at the next appointment if the medications are not working for you.  - Your lung function testing today is normal! - You have access to albuterol inhaler 2 puffs every 4-6 hours as needed for cough/wheeze/shortness of breath/chest tightness.  May use 15-20 minutes prior to activity.   Monitor frequency of use.    - Testing today to wheat is negative - Continue avoidance of wheat in diet - Recommend obtaining lab for wheat IgE in the future - Have access to self-injectable epinephrine Epipen 0.3mg  at all times - Follow emergency action plan in case of allergic reaction  Follow-up in 4-6 months or sooner if needed

## 2021-10-12 NOTE — Progress Notes (Signed)
New Patient Note  RE: Kathryn Mendez MRN: [id number] DOB: [birth year] Date of Office Visit: 10/12/2021  Primary care provider: Glendale Chard, MD  Chief Complaint: Allergies  History of present illness: Kathryn Mendez is a 70 y.o. female presenting today for evaluation of environmental allergy.  She reports symptoms of runny nose with throat clearing, congestion, watery eyes, itchy eyes, sneezing.   The symptoms can develop when she is exposed to strong scents or when she is outside like when working in the yard.  She has used nasal sprays (flonase) and it helps some.  She has not used allergy based eyedrops but has used some for dry eye.  She has tried claritin, zyrtec, allegra these don't help.  She does report getting sinus infections.    She has been prescribed an albuterol inhaler she reports having some shortness of breath. She feels like the albuterol helps some.  She states she may need to use albuterol 1-2 a month on average.    No history of eczema.  She reports food allergy where she reports she had lip swelling and difficult breathing.  She states this occurred with a nut bar that had wheat grain.  She does eat nuts in diet.  She does avoid wheat.  She states she was supposed to have an epinephrine device but for some reason was not provided from the pharmacy.    Review of systems: Review of Systems  Constitutional: Negative.   HENT: Negative.    Eyes: Negative.   Respiratory: Negative.    Cardiovascular: Negative.   Gastrointestinal: Negative.   Musculoskeletal: Negative.   Skin: Negative.   Allergic/Immunologic: Negative.   Neurological: Negative.    All other systems negative unless noted above in HPI  Past medical history: Past Medical History:  Diagnosis Date   High cholesterol    Hypertension     Past surgical history: Past Surgical History:  Procedure Laterality Date   CHOLECYSTECTOMY     TUBAL LIGATION      Family history:  Family  History  Problem Relation Age of Onset   Diabetes Mother    Hypertension Mother    Diabetes Sister     Social history: Lives in a home without carpeting with gas heating and central cooling.  No pets in the home.  There is no concern for water damage, mildew or roaches in the home.  She does not report an occupation at this time.  She denies a smoking history.   Medication List: Current Outpatient Medications  Medication Sig Dispense Refill   albuterol (VENTOLIN HFA) 108 (90 Base) MCG/ACT inhaler Inhale 2 puffs into the lungs every 4 (four) hours as needed for wheezing or shortness of breath. 18 g 0   allopurinol (ZYLOPRIM) 100 MG tablet TAKE 1 TABLET(100 MG) BY MOUTH DAILY 90 tablet 2   amLODipine (NORVASC) 5 MG tablet TAKE 1 TABLET(5 MG) BY MOUTH DAILY 90 tablet 2   Cholecalciferol (VITAMIN D) 125 MCG (5000 UT) CAPS Take 1 capsule by mouth daily.     fluticasone (FLONASE) 50 MCG/ACT nasal spray Place 2 sprays into both nostrils daily. 11.1 mL 1   hydrochlorothiazide (HYDRODIURIL) 25 MG tablet TAKE 1 TABLET(25 MG) BY MOUTH DAILY 90 tablet 1   metoprolol succinate (TOPROL-XL) 100 MG 24 hr tablet TAKE 1 TABLET(100 MG) BY MOUTH DAILY WITH OR IMMEDIATELY FOLLOWING A MEAL 90 tablet 1   pravastatin (PRAVACHOL) 40 MG tablet TAKE 1 TABLET(40 MG) BY MOUTH EVERY EVENING 90 tablet  2   Vitamin D, Ergocalciferol, (DRISDOL) 1.25 MG (50000 UNIT) CAPS capsule TAKE 1 CAPSULE BY MOUTH ON TUEDAYS AND FRIDAYS 24 capsule 0   No current facility-administered medications for this visit.    Known medication allergies: Allergies  Allergen Reactions   Other     Food allergies     Physical examination: Blood pressure (!) 142/84, pulse (!) 58, temperature 98.2 F (36.8 C), resp. rate 16, height 5\' 1"  (1.549 m), weight 168 lb 12.8 oz (76.6 kg), SpO2 98 %.  General: Alert, interactive, in no acute distress. HEENT: PERRLA, TMs pearly gray, turbinates mildly edematous without discharge, post-pharynx non  erythematous. Neck: Supple without lymphadenopathy. Lungs: Clear to auscultation without wheezing, rhonchi or rales. {no increased work of breathing. CV: Normal S1, S2 without murmurs. Abdomen: Nondistended, nontender. Skin: Warm and dry, without lesions or rashes. Extremities:  No clubbing, cyanosis or edema. Neuro:   Grossly intact.  Diagnositics/Labs  Spirometry: FEV1: 1.66L 97%, FVC: 2.37L 109%, ratio consistent with nonobstructive pattern  Allergy testing:   Airborne Adult Perc - 10/12/21 1505     Time Antigen Placed 1505    Allergen Manufacturer Lavella Hammock    Location Back    Number of Test 59    1. Control-Buffer 50% Glycerol Negative    2. Control-Histamine 1 mg/ml 2+    3. Albumin saline Negative    4. Parlier Negative    5. [place] Negative    6. Johnson Negative    7. [place]heim Blue Negative    8. Meadow Fescue Negative    9. Perennial Rye 2+    10. Sweet Vernal Negative    11. Timothy Negative    12. Cocklebur Negative    13. Burweed Marshelder 2+    14. Ragweed, short Negative    15. Ragweed, Giant Negative    16. Plantain,  English Negative    17. Lamb's Quarters Negative    18. Sheep Sorrell Negative    19. Rough Pigweed 2+    20. Marsh Elder, Rough 2+    21. Mugwort, Common Negative    22. Ash mix Negative    23. Birch mix Negative    24. Beech American Negative    25. Box, Elder 2+    26. Cedar, red Negative    27. Cottonwood, [place] Negative    28. Elm mix Negative    29. Hickory 2+    30. Maple mix 2+    31. Oak, [place] mix Negative    32. Pecan Pollen Negative    33. Pine mix Negative    34. Sycamore Eastern Negative    35. Moore, Black Pollen Negative    36. Alternaria alternata Negative    37. Cladosporium Herbarum 2+    38. Aspergillus mix Negative    39. Penicillium mix Negative    40. Bipolaris sorokiniana (Helminthosporium) Negative    41. Drechslera spicifera (Curvularia) Negative    42. Mucor plumbeus Negative    43. Fusarium  moniliforme Negative    44. Aureobasidium pullulans (pullulara) Negative    45. Rhizopus oryzae Negative    46. Botrytis cinera Negative    47. Epicoccum nigrum Negative    48. Phoma betae Negative    49. Candida Albicans Negative    50. Trichophyton mentagrophytes Negative    51. Mite, D Farinae  5,000 AU/ml Negative    52. Mite, D Pteronyssinus  5,000 AU/ml Negative    53. Cat Hair 10,000 BAU/ml 2+    54.  Dog Epithelia Negative    55. Mixed Feathers Negative    56. Horse Epithelia Negative    57. Cockroach, German Negative    58. Mouse Negative    59. Tobacco Leaf 2+    Comments nnn             Food Adult Perc - 10/12/21 1500     Time Antigen Placed 1330    Allergen Manufacturer Lavella Hammock    Location Back    Number of allergen test 1    3. Wheat Negative             Allergy testing results were read and interpreted by provider, documented by clinical staff.   Assessment and plan: Allergic rhinitis with conjunctivitis  - Testing today showed: grasses, weeds, trees, indoor molds, outdoor molds, cat, and tobacco leaf . - Copy of test results provided.  - Avoidance measures provided. - Start taking: Xyzal (levocetirizine) 5mg  tablet once daily.  This is an antihistamine that may work better than zyrtec, Human resources officer and claritin.  Nasacort (triamcinolone) 2 sprays per nostril daily for 1-2 weeks at a time before stopping once nasal congestion improves for maximum benefit Astelin (azelastine) 2 sprays per nostril 1-2 times daily as needed for nasal drainage/throat clearing Olopatadine 1 drop per eye daily as needed for itchy/watery eyes - You can use an extra dose of the antihistamine, if needed, for breakthrough symptoms.  - Consider nasal saline rinses 1-2 times daily to remove allergens from the nasal cavities as well as help with mucous clearance (this is especially helpful to do before the nasal sprays are given) - Consider allergy shots as a means of long-term  control. - Allergy shots "re-train" and "reset" the immune system to ignore environmental allergens and decrease the resulting immune response to those allergens (sneezing, itchy watery eyes, runny nose, nasal congestion, etc).    - Allergy shots improve symptoms in 75-85% of patients.  - We can discuss more at the next appointment if the medications are not working for you.  Reactive airway - Your lung function testing today is normal! - You have access to albuterol inhaler 2 puffs every 4-6 hours as needed for cough/wheeze/shortness of breath/chest tightness.  May use 15-20 minutes prior to activity.   Monitor frequency of use.    Adverse food reaction - Testing today to wheat is negative - Continue avoidance of wheat in diet - Recommend obtaining lab for wheat IgE in the future - Have access to self-injectable epinephrine Epipen 0.3mg  at all times - Follow emergency action plan in case of allergic reaction  Follow-up in 4-6 months or sooner if needed  I appreciate the opportunity to take part in Maysen's care. Please do not hesitate to contact me with questions.  Sincerely,   Prudy Feeler, MD Allergy/Immunology [facility] [place]

## 2021-10-15 NOTE — Addendum Note (Signed)
Addended by: Eloy End D on: 10/15/2021 04:55 PM   Modules accepted: Orders

## 2021-10-16 ENCOUNTER — Other Ambulatory Visit: Payer: Self-pay | Admitting: *Deleted

## 2021-10-18 ENCOUNTER — Telehealth: Payer: Self-pay

## 2021-10-18 ENCOUNTER — Other Ambulatory Visit: Payer: Self-pay | Admitting: Internal Medicine

## 2021-10-18 NOTE — Telephone Encounter (Signed)
° °  Telephone encounter was:  Successful.  10/18/2021 Name: Kathryn Mendez MRN: [id number] DOB: [birth year]  Kathryn Mendez is a 70 y.o. year old female who is a primary care patient of Glendale Chard, MD . The community resource team was consulted for assistance with [place] guide performed the following interventions:  Pt declined needing meals on wheels and would like a list of [facility] food pantries to be sent to her by mail.  Follow Up Plan:  Care guide will follow up with patient by phone over the next few days to ensure mail was received.   [facility] management  [place], [place]  Main Phone: [phone]   E-mail: [email or web address]  Website: [email or web address]

## 2021-11-01 ENCOUNTER — Telehealth: Payer: Self-pay

## 2021-11-01 NOTE — Telephone Encounter (Signed)
° °  Telephone encounter was:  Successful.  11/01/2021 Name: Kathryn Mendez MRN: [id number] DOB: 06/03/1951  Kathryn Mendez is a 71 y.o. year old female who is a primary care patient of Glendale Chard, MD . The community resource team was consulted for assistance with [place] guide performed the following interventions:  Patient advised she received the food pantry information by mail. Pt did acknowledge that at this time, she does not have any further needs.  Follow Up Plan:  No further follow up planned at this time. The patient has been provided with needed resources.  [facility] management  [place], [place]  Main Phone: [phone]   E-mail: [email or web address]  Website: [email or web address]

## 2021-11-13 DIAGNOSIS — H52203 Unspecified astigmatism, bilateral: Secondary | ICD-10-CM | POA: Diagnosis not present

## 2021-11-13 DIAGNOSIS — H524 Presbyopia: Secondary | ICD-10-CM | POA: Diagnosis not present

## 2021-11-13 DIAGNOSIS — H25813 Combined forms of age-related cataract, bilateral: Secondary | ICD-10-CM | POA: Diagnosis not present

## 2021-11-16 ENCOUNTER — Other Ambulatory Visit: Payer: Self-pay | Admitting: Internal Medicine

## 2021-11-26 ENCOUNTER — Ambulatory Visit
Admission: RE | Admit: 2021-11-26 | Discharge: 2021-11-26 | Disposition: A | Discharge status: Home / Self Care | Payer: Medicare HMO | Source: Ambulatory Visit | Attending: Internal Medicine | Admitting: Internal Medicine

## 2021-11-26 ENCOUNTER — Other Ambulatory Visit: Payer: Self-pay

## 2021-11-26 DIAGNOSIS — Z1231 Encounter for screening mammogram for malignant neoplasm of breast: Secondary | ICD-10-CM | POA: Diagnosis not present

## 2021-11-27 ENCOUNTER — Other Ambulatory Visit: Payer: Self-pay | Admitting: Internal Medicine

## 2021-11-27 DIAGNOSIS — R928 Other abnormal and inconclusive findings on diagnostic imaging of breast: Secondary | ICD-10-CM

## 2021-12-14 ENCOUNTER — Other Ambulatory Visit: Payer: Self-pay | Admitting: Internal Medicine

## 2021-12-21 ENCOUNTER — Ambulatory Visit
Admission: RE | Admit: 2021-12-21 | Discharge: 2021-12-21 | Disposition: A | Discharge status: Home / Self Care | Payer: Medicare HMO | Source: Ambulatory Visit | Attending: Internal Medicine | Admitting: Internal Medicine

## 2021-12-21 ENCOUNTER — Other Ambulatory Visit: Payer: Self-pay

## 2021-12-21 DIAGNOSIS — R928 Other abnormal and inconclusive findings on diagnostic imaging of breast: Secondary | ICD-10-CM

## 2021-12-21 DIAGNOSIS — R922 Inconclusive mammogram: Secondary | ICD-10-CM | POA: Diagnosis not present

## 2021-12-24 ENCOUNTER — Other Ambulatory Visit: Payer: Medicare HMO

## 2022-01-16 ENCOUNTER — Ambulatory Visit (INDEPENDENT_AMBULATORY_CARE_PROVIDER_SITE_OTHER): Payer: Medicare HMO | Admitting: Internal Medicine

## 2022-01-16 ENCOUNTER — Other Ambulatory Visit: Payer: Self-pay

## 2022-01-16 ENCOUNTER — Encounter: Payer: Self-pay | Admitting: Internal Medicine

## 2022-01-16 VITALS — BP 110/60 | HR 61 | Temp 98.2°F | Ht 62.2 in | Wt 172.6 lb

## 2022-01-16 DIAGNOSIS — E7800 Pure hypercholesterolemia, unspecified: Secondary | ICD-10-CM

## 2022-01-16 DIAGNOSIS — Z6831 Body mass index (BMI) 31.0-31.9, adult: Secondary | ICD-10-CM

## 2022-01-16 DIAGNOSIS — Z2821 Immunization not carried out because of patient refusal: Secondary | ICD-10-CM | POA: Diagnosis not present

## 2022-01-16 DIAGNOSIS — I10 Essential (primary) hypertension: Secondary | ICD-10-CM

## 2022-01-16 DIAGNOSIS — M1A2790 Drug-induced chronic gout, unspecified ankle and foot, without tophus (tophi): Secondary | ICD-10-CM

## 2022-01-16 DIAGNOSIS — E6609 Other obesity due to excess calories: Secondary | ICD-10-CM | POA: Diagnosis not present

## 2022-01-16 MED ORDER — ALBUTEROL SULFATE HFA 108 (90 BASE) MCG/ACT IN AERS: 2.0000 | INHALATION_SPRAY | RESPIRATORY_TRACT | 3 refills | Status: DC | PRN

## 2022-01-16 NOTE — Patient Instructions (Signed)

## 2022-01-16 NOTE — Progress Notes (Signed)
?Rich Brave Llittleton,acting as a Education administrator for [name], MD.,have documented all relevant documentation on the behalf of [name], MD,as directed by  [name], MD while in the presence of [name], MD.  ?This visit occurred during the SARS-CoV-2 public health emergency.  Safety protocols were in place, including screening questions prior to the visit, additional usage of staff PPE, and extensive cleaning of exam room while observing appropriate contact time as indicated for disinfecting solutions. ? ?Subjective:  ?  ? Patient ID: Kathryn Mendez , female    DOB: 05-10-1951 , 71 y.o.   MRN: [id number] ? ? ?Chief Complaint  ?Patient presents with  ? Hypertension  ? ? ?HPI ? ?The patient is here today for a blood pressure and cholesterol follow-up.  She reports compliance with meds. She denies headaches, chest pain and shortness of breath.  ? ?Hypertension ?This is a chronic problem. The current episode started more than 1 year ago. The problem has been gradually improving since onset. The problem is controlled. Pertinent negatives include no blurred vision, chest pain, palpitations or shortness of breath. Risk factors for coronary artery disease include dyslipidemia, post-menopausal state, sedentary lifestyle and obesity. The current treatment provides moderate improvement. Hypertensive end-organ damage includes kidney disease.   ? ?Past Medical History:  ?Diagnosis Date  ? High cholesterol   ? Hypertension   ?  ? ?Family History  ?Problem Relation Age of Onset  ? Diabetes Mother   ? Hypertension Mother   ? Diabetes Sister   ? ? ? ?Current Outpatient Medications:  ?  allopurinol (ZYLOPRIM) 100 MG tablet, TAKE 1 TABLET(100 MG) BY MOUTH DAILY, Disp: 90 tablet, Rfl: 2 ?  amLODipine (NORVASC) 5 MG tablet, TAKE 1 TABLET(5 MG) BY MOUTH DAILY, Disp: 90 tablet, Rfl: 2 ?  Azelastine HCl 0.15 % SOLN, 2 sprays per nostril 1-2 times daily as needed for nasal drainage/throat clearing, Disp: 30 mL,  Rfl: 5 ?  Cholecalciferol (VITAMIN D) 125 MCG (5000 UT) CAPS, Take 1 capsule by mouth daily., Disp: , Rfl:  ?  hydrochlorothiazide (HYDRODIURIL) 25 MG tablet, TAKE 1 TABLET(25 MG) BY MOUTH DAILY, Disp: 90 tablet, Rfl: 1 ?  levocetirizine (XYZAL) 5 MG tablet, Take 1 tablet (5 mg total) by mouth every evening., Disp: 30 tablet, Rfl: 5 ?  metoprolol succinate (TOPROL-XL) 100 MG 24 hr tablet, TAKE 1 TABLET(100 MG) BY MOUTH DAILY WITH OR IMMEDIATELY FOLLOWING A MEAL, Disp: 90 tablet, Rfl: 1 ?  Olopatadine HCl 0.2 % SOLN, 1 drop per eye daily as needed for itchy/watery eyes, Disp: 2.5 mL, Rfl: 5 ?  pravastatin (PRAVACHOL) 40 MG tablet, TAKE 1 TABLET(40 MG) BY MOUTH EVERY EVENING, Disp: 90 tablet, Rfl: 2 ?  triamcinolone (NASACORT) 55 MCG/ACT AERO nasal inhaler, 2 sprays per nostril daily for 1-2 weeks at a time before stopping once nasal congestion improves for maximum benefit, Disp: 1 each, Rfl: 5 ?  Vitamin D, Ergocalciferol, (DRISDOL) 1.25 MG (50000 UNIT) CAPS capsule, TAKE 1 CAPSULE BY MOUTH ON TUEDAYS AND FRIDAYS, Disp: 24 capsule, Rfl: 0 ?  albuterol (VENTOLIN HFA) 108 (90 Base) MCG/ACT inhaler, Inhale 2 puffs into the lungs every 4 (four) hours as needed for wheezing or shortness of breath., Disp: 18 g, Rfl: 3  ? ?Allergies  ?Allergen Reactions  ? Other   ?  Food allergies  ?  ? ?Review of Systems  ?Constitutional: Negative.   ?Eyes:  Negative for blurred vision.  ?Respiratory: Negative.  Negative for shortness  of breath.   ?Cardiovascular: Negative.  Negative for chest pain and palpitations.  ?Gastrointestinal: Negative.   ?Neurological: Negative.   ?Psychiatric/Behavioral: Negative.     ? ?Today's Vitals  ? 01/16/22 1458  ?BP: 110/60  ?Pulse: 61  ?Temp: 98.2 ?F (36.8 ?C)  ?Weight: 172 lb 9.6 oz (78.3 kg)  ?Height: 5' 2.2" (1.58 m)  ?PainSc: 0-No pain  ? ?Body mass index is 31.37 kg/m?.  ? ?Objective:  ?Physical Exam ?Vitals and nursing note reviewed.  ?Constitutional:   ?   Appearance: Normal appearance.  ?HENT:   ?   Head: Normocephalic and atraumatic.  ?   Nose:  ?   Comments: Masked  ?   Mouth/Throat:  ?   Comments: Masked  ?Eyes:  ?   Extraocular Movements: Extraocular movements intact.  ?Cardiovascular:  ?   Rate and Rhythm: Normal rate and regular rhythm.  ?   Heart sounds: Normal heart sounds.  ?Pulmonary:  ?   Effort: Pulmonary effort is normal.  ?   Breath sounds: Normal breath sounds.  ?Skin: ?   General: Skin is warm.  ?Neurological:  ?   General: No focal deficit present.  ?   Mental Status: She is alert.  ?Psychiatric:     ?   Mood and Affect: Mood normal.     ?   Behavior: Behavior normal.  ?   ?Assessment And Plan:  ?   ?1. Essential hypertension ?Comments: Chronic, well controlled. No med changes. She will rto in 4 months for her next CPE. I will check renal function today.  ?- Lipid panel ?- CMP14+EGFR ? ?2. Pure hypercholesterolemia ?Comments: Chronic, she will c/w pravastatin 38m daily. She is encouraged to follow heart healthy lifestyle. ?- Lipid panel ?- CMP14+EGFR ? ?3. 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  ?- Uric acid ? ?4. Class 1 obesity due to excess calories with serious comorbidity and body mass index (BMI) of 31.0 to 31.9 in adult ?Comments: She is encouraged to strive for BMI<30 to decrease cardiac risk. Advised to aim for at least 150 minutes of exercise per week.  ? ?5. Herpes zoster vaccination declined ?  ?Patient was given opportunity to ask questions. Patient verbalized understanding of the plan and was able to repeat key elements of the plan. All questions were answered to their satisfaction.  ? ?I, RMaximino Greenland MD, have reviewed all documentation for this visit. The documentation on 01/16/22 for the exam, diagnosis, procedures, and orders are all accurate and complete.  ? ?IF YOU HAVE BEEN REFERRED TO A SPECIALIST, IT MAY TAKE 1-2 WEEKS TO SCHEDULE/PROCESS THE REFERRAL. IF YOU HAVE NOT HEARD  FROM US/SPECIALIST IN TWO WEEKS, PLEASE GIVE [place] CALL AT [phone].  ? ?THE PATIENT IS ENCOURAGED TO PRACTICE SOCIAL DISTANCING DUE TO THE COVID-19 PANDEMIC.   ?

## 2022-01-17 LAB — CMP14+EGFR
ALT: 17 IU/L (ref 0–32)
AST: 17 IU/L (ref 0–40)
Albumin/Globulin Ratio: 1.3 (ref 1.2–2.2)
Albumin: 3.8 g/dL (ref 3.8–4.8)
Alkaline Phosphatase: 85 IU/L (ref 44–121)
BUN/Creatinine Ratio: 15 (ref 12–28)
BUN: 14 mg/dL (ref 8–27)
Bilirubin Total: 0.5 mg/dL (ref 0.0–1.2)
CO2: 28 mmol/L (ref 20–29)
Calcium: 9.2 mg/dL (ref 8.7–10.3)
Chloride: 101 mmol/L (ref 96–106)
Creatinine, Ser: 0.93 mg/dL (ref 0.57–1.00)
Globulin, Total: 2.9 g/dL (ref 1.5–4.5)
Glucose: 99 mg/dL (ref 70–99)
Potassium: 3.3 mmol/L — ABNORMAL LOW (ref 3.5–5.2)
Sodium: 142 mmol/L (ref 134–144)
Total Protein: 6.7 g/dL (ref 6.0–8.5)
eGFR: 66 mL/min/{1.73_m2} (ref >59)

## 2022-01-17 LAB — LIPID PANEL
Chol/HDL Ratio: 2.7 ratio (ref 0.0–4.4)
Cholesterol, Total: 216 mg/dL — ABNORMAL HIGH (ref 100–199)
HDL: 81 mg/dL (ref >39)
LDL Chol Calc (NIH): 125 mg/dL — ABNORMAL HIGH (ref 0–99)
Triglycerides: 59 mg/dL (ref 0–149)
VLDL Cholesterol Cal: 10 mg/dL (ref 5–40)

## 2022-01-17 LAB — URIC ACID: Uric Acid: 6.9 mg/dL (ref 3.0–7.2)

## 2022-02-07 ENCOUNTER — Other Ambulatory Visit: Payer: Self-pay | Admitting: Internal Medicine

## 2022-02-17 ENCOUNTER — Other Ambulatory Visit: Payer: Self-pay | Admitting: Internal Medicine

## 2022-04-16 ENCOUNTER — Telehealth: Payer: Self-pay

## 2022-04-16 MED ORDER — LEVOCETIRIZINE DIHYDROCHLORIDE 5 MG PO TABS: 5.0000 mg | ORAL_TABLET | Freq: Every evening | ORAL | 5 refills | Status: DC

## 2022-04-16 NOTE — Telephone Encounter (Signed)
Got a fax from [facility] for a refill of the Xyzal 5 mg. Per guidelines for refills for allergy medication, refill is being sent into pharmacy.

## 2022-05-07 ENCOUNTER — Other Ambulatory Visit: Payer: Self-pay | Admitting: Internal Medicine

## 2022-05-21 ENCOUNTER — Other Ambulatory Visit: Payer: Self-pay

## 2022-05-21 ENCOUNTER — Other Ambulatory Visit (HOSPITAL_COMMUNITY)
Admission: RE | Admit: 2022-05-21 | Discharge: 2022-05-21 | Disposition: A | Discharge status: Home / Self Care | Payer: Medicare HMO | Source: Ambulatory Visit | Attending: Internal Medicine | Admitting: Internal Medicine

## 2022-05-21 ENCOUNTER — Ambulatory Visit (INDEPENDENT_AMBULATORY_CARE_PROVIDER_SITE_OTHER): Payer: Medicare HMO | Admitting: Internal Medicine

## 2022-05-21 ENCOUNTER — Encounter: Payer: Self-pay | Admitting: Internal Medicine

## 2022-05-21 VITALS — BP 122/68 | HR 70 | Temp 98.1°F | Ht 62.0 in | Wt 170.0 lb

## 2022-05-21 DIAGNOSIS — E7800 Pure hypercholesterolemia, unspecified: Secondary | ICD-10-CM

## 2022-05-21 DIAGNOSIS — Z1151 Encounter for screening for human papillomavirus (HPV): Secondary | ICD-10-CM | POA: Diagnosis not present

## 2022-05-21 DIAGNOSIS — M1A2790 Drug-induced chronic gout, unspecified ankle and foot, without tophus (tophi): Secondary | ICD-10-CM

## 2022-05-21 DIAGNOSIS — H6122 Impacted cerumen, left ear: Secondary | ICD-10-CM | POA: Diagnosis not present

## 2022-05-21 DIAGNOSIS — I10 Essential (primary) hypertension: Secondary | ICD-10-CM

## 2022-05-21 DIAGNOSIS — Z01419 Encounter for gynecological examination (general) (routine) without abnormal findings: Secondary | ICD-10-CM

## 2022-05-21 DIAGNOSIS — R7309 Other abnormal glucose: Secondary | ICD-10-CM | POA: Diagnosis not present

## 2022-05-21 DIAGNOSIS — N6342 Unspecified lump in left breast, subareolar: Secondary | ICD-10-CM

## 2022-05-21 DIAGNOSIS — Z0000 Encounter for general adult medical examination without abnormal findings: Secondary | ICD-10-CM

## 2022-05-21 LAB — POCT URINALYSIS DIPSTICK
Bilirubin, UA: NEGATIVE
Glucose, UA: NEGATIVE
Ketones, UA: NEGATIVE
Nitrite, UA: NEGATIVE
Protein, UA: POSITIVE — AB
Spec Grav, UA: 1.015 (ref 1.010–1.025)
Urobilinogen, UA: 0.2 E.U./dL
pH, UA: 7 (ref 5.0–8.0)

## 2022-05-21 MED ORDER — AMLODIPINE BESYLATE 5 MG PO TABS: 5.0000 mg | ORAL_TABLET | Freq: Every day | ORAL | 2 refills | Status: DC

## 2022-05-21 MED ORDER — AMLODIPINE BESYLATE 5 MG PO TABS: ORAL_TABLET | ORAL | 2 refills | Status: DC

## 2022-05-21 MED ORDER — HYDROCHLOROTHIAZIDE 25 MG PO TABS: ORAL_TABLET | ORAL | 1 refills | Status: DC

## 2022-05-21 MED ORDER — HYDROCHLOROTHIAZIDE 25 MG PO TABS: 25.0000 mg | ORAL_TABLET | Freq: Every day | ORAL | 1 refills | Status: DC

## 2022-05-21 NOTE — Patient Instructions (Addendum)

## 2022-05-21 NOTE — Progress Notes (Signed)
Kathryn Mendez,acting as a Education administrator for Kathryn Greenland, MD.,have documented all relevant documentation on the behalf of Kathryn Greenland, MD,as directed by  Kathryn Greenland, MD while in the presence of Kathryn Greenland, MD.   Subjective:     Patient ID: Kathryn Mendez , female    DOB: 1951-08-04 , 71 y.o.   MRN: [id number]   Chief Complaint  Patient presents with   Annual Exam    HPI  Patient presents today for HM. She is no longer followed by GYN. She reports compliance with BP meds. She denies headaches, chest pain and shortness of breath. She has no other concerns or complaints at this time.   BP Readings from Last 3 Encounters: 05/21/22 : 122/68 01/16/22 : 110/60 10/12/21 : (!) 142/84    Hypertension This is a chronic problem. The current episode started more than 1 year ago. The problem has been gradually improving since onset. The problem is controlled. Pertinent negatives include no blurred vision, chest pain, palpitations or shortness of breath. Risk factors for coronary artery disease include dyslipidemia, post-menopausal state, sedentary lifestyle and obesity. The current treatment provides moderate improvement. Hypertensive end-organ damage includes kidney disease.     Past Medical History:  Diagnosis Date   High cholesterol    Hypertension      Family History  Problem Relation Age of Onset   Diabetes Mother    Hypertension Mother    Diabetes Sister      Current Outpatient Medications:    albuterol (VENTOLIN HFA) 108 (90 Base) MCG/ACT inhaler, Inhale 2 puffs into the lungs every 4 (four) hours as needed for wheezing or shortness of breath., Disp: 18 g, Rfl: 3   allopurinol (ZYLOPRIM) 100 MG tablet, TAKE 1 TABLET(100 MG) BY MOUTH DAILY, Disp: 90 tablet, Rfl: 2   Azelastine HCl 0.15 % SOLN, 2 sprays per nostril 1-2 times daily as needed for nasal drainage/throat clearing, Disp: 30 mL, Rfl: 5   Cholecalciferol (VITAMIN D) 125 MCG (5000 UT) CAPS, Take 1  capsule by mouth daily., Disp: , Rfl:    levocetirizine (XYZAL) 5 MG tablet, Take 1 tablet (5 mg total) by mouth every evening., Disp: 30 tablet, Rfl: 5   metoprolol succinate (TOPROL-XL) 100 MG 24 hr tablet, TAKE 1 TABLET(100 MG) BY MOUTH DAILY WITH OR IMMEDIATELY FOLLOWING A MEAL, Disp: 90 tablet, Rfl: 1   Olopatadine HCl 0.2 % SOLN, 1 drop per eye daily as needed for itchy/watery eyes, Disp: 2.5 mL, Rfl: 5   pravastatin (PRAVACHOL) 40 MG tablet, TAKE 1 TABLET(40 MG) BY MOUTH EVERY EVENING, Disp: 90 tablet, Rfl: 2   triamcinolone (NASACORT) 55 MCG/ACT AERO nasal inhaler, 2 sprays per nostril daily for 1-2 weeks at a time before stopping once nasal congestion improves for maximum benefit, Disp: 1 each, Rfl: 5   Vitamin D, Ergocalciferol, (DRISDOL) 1.25 MG (50000 UNIT) CAPS capsule, TAKE 1 CAPSULE BY MOUTH ON TUEDAYS AND FRIDAYS, Disp: 24 capsule, Rfl: 0   amLODipine (NORVASC) 5 MG tablet, Take 1 tablet (5 mg total) by mouth daily., Disp: 90 tablet, Rfl: 2   hydrochlorothiazide (HYDRODIURIL) 25 MG tablet, Take 1 tablet (25 mg total) by mouth daily., Disp: 90 tablet, Rfl: 1   Allergies  Allergen Reactions   Other     Food allergies      The patient states she uses post menopausal status for birth control. Last LMP was No LMP recorded. Patient is postmenopausal.. Negative for Dysmenorrhea. Negative for: breast discharge, breast  lump(s), breast pain and breast self exam. Associated symptoms include abnormal vaginal bleeding. Pertinent negatives include abnormal bleeding (hematology), anxiety, decreased libido, depression, difficulty falling sleep, dyspareunia, history of infertility, nocturia, sexual dysfunction, sleep disturbances, urinary incontinence, urinary urgency, vaginal discharge and vaginal itching. Diet regular.The patient states her exercise level is  intermittent.  . The patient's tobacco use is:  Social History   Tobacco Use  Smoking Status Never  Smokeless Tobacco Never  . She has  been exposed to passive smoke. The patient's alcohol use is:  Social History   Substance and Sexual Activity  Alcohol Use Not Currently   Comment: occasional    Review of Systems  Constitutional: Negative.   HENT: Negative.    Eyes: Negative.  Negative for blurred vision.  Respiratory: Negative.  Negative for shortness of breath.   Cardiovascular: Negative.  Negative for chest pain and palpitations.  Gastrointestinal: Negative.   Endocrine: Negative.   Genitourinary: Negative.   Musculoskeletal: Negative.   Skin: Negative.   Allergic/Immunologic: Negative.   Neurological: Negative.   Hematological: Negative.   Psychiatric/Behavioral: Negative.       Today's Vitals   05/21/22 1412  BP: 122/68  Pulse: 70  Temp: 98.1 F (36.7 C)  TempSrc: Oral  Weight: 170 lb (77.1 kg)  Height: _0  (1.575 m)  PainSc: 0-No pain   Body mass index is 31.09 kg/m.  Wt Readings from Last 3 Encounters:  05/21/22 170 lb (77.1 kg)  01/16/22 172 lb 9.6 oz (78.3 kg)  10/12/21 168 lb 12.8 oz (76.6 kg)     Objective:  Physical Exam Vitals and nursing note reviewed. Exam conducted with a chaperone present.  Constitutional:      Appearance: Normal appearance.  HENT:     Head: Normocephalic and atraumatic.     Right Ear: Tympanic membrane, ear canal and external ear normal.     Left Ear: Ear canal and external ear normal. There is impacted cerumen.     Nose: Nose normal.     Mouth/Throat:     Mouth: Mucous membranes are moist.     Pharynx: Oropharynx is clear.  Eyes:     Extraocular Movements: Extraocular movements intact.     Conjunctiva/sclera: Conjunctivae normal.     Pupils: Pupils are equal, round, and reactive to light.  Cardiovascular:     Rate and Rhythm: Normal rate and regular rhythm.     Pulses: Normal pulses.     Heart sounds: Normal heart sounds.  Pulmonary:     Effort: Pulmonary effort is normal.     Breath sounds: Normal breath sounds.  Chest:  Breasts:    Tanner  Score is 5.     Right: Normal.     Left: Mass present.       Comments: Pea sized mass left breast, no overlying rash Abdominal:     General: Abdomen is flat. Bowel sounds are normal.     Palpations: Abdomen is soft.     Hernia: There is no hernia in the left inguinal area or right inguinal area.  Genitourinary:    General: Normal vulva.     Exam position: Lithotomy position.     Tanner stage (genital): 5.     Vagina: Normal.     Cervix: Normal.     Uterus: Normal.      Rectum: Normal. Guaiac result negative.  Musculoskeletal:        General: Normal range of motion.     Cervical back: Normal range of  motion and neck supple.  Lymphadenopathy:     Lower Body: No right inguinal adenopathy. No left inguinal adenopathy.  Skin:    General: Skin is warm and dry.  Neurological:     General: No focal deficit present.     Mental Status: She is alert and oriented to person, place, and time.  Psychiatric:        Mood and Affect: Mood normal.        Behavior: Behavior normal.      Assessment And Plan:     1. Annual physical exam Comments: A full exam was performed. Importance of monthly self breast exams was discussed with the patient. PATIENT IS ADVISED TO GET 30-45 MINUTES REGULAR EXERCISE NO LESS THAN FOUR TO FIVE DAYS PER WEEK - BOTH WEIGHTBEARING EXERCISES AND AEROBIC ARE RECOMMENDED.  PATIENT IS ADVISED TO FOLLOW A HEALTHY DIET WITH AT LEAST SIX FRUITS/VEGGIES PER DAY, DECREASE INTAKE OF RED MEAT, AND TO INCREASE FISH INTAKE TO TWO DAYS PER WEEK.  MEATS/FISH SHOULD NOT BE FRIED, BAKED OR BROILED IS PREFERABLE.  IT IS ALSO IMPORTANT TO CUT BACK ON YOUR SUGAR INTAKE. PLEASE AVOID ANYTHING WITH ADDED SUGAR, CORN SYRUP OR OTHER SWEETENERS. IF YOU MUST USE A SWEETENER, YOU CAN TRY STEVIA. IT IS ALSO IMPORTANT TO AVOID ARTIFICIALLY SWEETENERS AND DIET BEVERAGES. LASTLY, I SUGGEST WEARING SPF 50 SUNSCREEN ON EXPOSED PARTS AND ESPECIALLY WHEN IN THE DIRECT SUNLIGHT FOR AN EXTENDED PERIOD OF TIME.   PLEASE AVOID FAST FOOD RESTAURANTS AND INCREASE YOUR WATER INTAKE. - POCT Urinalysis Dipstick (81002) - Microalbumin / Creatinine Urine Ratio - POC Hemoccult Bld/Stl (1-Cd Office Dx)  2. Cervical smear, as part of routine gynecological examination Comments: Pap smear performed. Stool is heme negative.  - Cytology -Pap Smear - POC Hemoccult Bld/Stl (1-Cd Office Dx)  3. Essential hypertension Comments: Chronic, well controlled. EKG performed, SB w/o acute changes. She will c/w metoprolol, amlodipine and HCTZ. She will f/u in 6 months. - EKG 12-Lead - CBC no Diff - CMP14+EGFR - POCT Urinalysis Dipstick (81002) - Microalbumin / Creatinine Urine Ratio  4. Pure hypercholesterolemia Comments: Chronic, now taking pravastatin daily. She is not having any problems with meds. If not at goal, will switch her to atorvastatin. - Hemoglobin A1c - Lipid panel  5. Impacted cerumen of left ear Comments: After obtaining verbal consent, left ear was flushed by irrigation without complications. No TM abnormalities were noted.  - Ear Lavage  6. Subareolar mass of left breast Comments: She agrees to diagnostic mammo and u/s of left breast . - MM Digital Diagnostic Unilat L; Future - [place] Unlisted Procedure Breast; Future  7. 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 Patient verbalized understanding of the plan and was able to repeat key elements of the plan. All questions were answered to their satisfaction.   I, Kathryn Greenland, MD, have reviewed all documentation for this visit. The documentation on 06/08/22 for the exam, diagnosis, procedures, and orders are all accurate and complete.   THE PATIENT IS ENCOURAGED TO PRACTICE SOCIAL DISTANCING DUE TO THE COVID-19 PANDEMIC.

## 2022-05-22 ENCOUNTER — Other Ambulatory Visit: Payer: Self-pay

## 2022-05-22 LAB — CBC
Hematocrit: 40.4 % (ref 34.0–46.6)
Hemoglobin: 13.6 g/dL (ref 11.1–15.9)
MCH: 28.3 pg (ref 26.6–33.0)
MCHC: 33.7 g/dL (ref 31.5–35.7)
MCV: 84 fL (ref 79–97)
Platelets: 346 10*3/uL (ref 150–450)
RBC: 4.81 x10E6/uL (ref 3.77–5.28)
RDW: 12.9 % (ref 11.7–15.4)
WBC: 5.3 10*3/uL (ref 3.4–10.8)

## 2022-05-22 LAB — CMP14+EGFR
ALT: 15 IU/L (ref 0–32)
AST: 18 IU/L (ref 0–40)
Albumin/Globulin Ratio: 1.8 (ref 1.2–2.2)
Albumin: 4.4 g/dL (ref 3.9–4.9)
Alkaline Phosphatase: 83 IU/L (ref 44–121)
BUN/Creatinine Ratio: 14 (ref 12–28)
BUN: 12 mg/dL (ref 8–27)
Bilirubin Total: 0.5 mg/dL (ref 0.0–1.2)
CO2: 26 mmol/L (ref 20–29)
Calcium: 7.6 mg/dL — ABNORMAL LOW (ref 8.7–10.3)
Chloride: 101 mmol/L (ref 96–106)
Creatinine, Ser: 0.88 mg/dL (ref 0.57–1.00)
Globulin, Total: 2.4 g/dL (ref 1.5–4.5)
Glucose: 94 mg/dL (ref 70–99)
Potassium: 3.5 mmol/L (ref 3.5–5.2)
Sodium: 141 mmol/L (ref 134–144)
Total Protein: 6.8 g/dL (ref 6.0–8.5)
eGFR: 71 mL/min/{1.73_m2} (ref >59)

## 2022-05-22 LAB — MICROALBUMIN / CREATININE URINE RATIO
Creatinine, Urine: 137.6 mg/dL
Microalb/Creat Ratio: 107 mg/g creat — ABNORMAL HIGH (ref 0–29)
Microalbumin, Urine: 147.6 ug/mL

## 2022-05-22 LAB — LIPID PANEL
Chol/HDL Ratio: 2.3 ratio (ref 0.0–4.4)
Cholesterol, Total: 200 mg/dL — ABNORMAL HIGH (ref 100–199)
HDL: 86 mg/dL (ref >39)
LDL Chol Calc (NIH): 101 mg/dL — ABNORMAL HIGH (ref 0–99)
Triglycerides: 71 mg/dL (ref 0–149)
VLDL Cholesterol Cal: 13 mg/dL (ref 5–40)

## 2022-05-22 LAB — HEMOGLOBIN A1C
Est. average glucose Bld gHb Est-mCnc: 114 mg/dL
Hgb A1c MFr Bld: 5.6 % (ref 4.8–5.6)

## 2022-05-22 LAB — URIC ACID: Uric Acid: 7 mg/dL (ref 3.0–7.2)

## 2022-05-22 MED ORDER — HYDROCHLOROTHIAZIDE 25 MG PO TABS: 25.0000 mg | ORAL_TABLET | Freq: Every day | ORAL | 1 refills | Status: DC

## 2022-05-22 MED ORDER — AMLODIPINE BESYLATE 5 MG PO TABS: 5.0000 mg | ORAL_TABLET | Freq: Every day | ORAL | 2 refills | Status: DC

## 2022-05-27 LAB — CYTOLOGY - PAP
Comment: NEGATIVE
Diagnosis: NEGATIVE
High risk HPV: NEGATIVE

## 2022-06-08 LAB — POC HEMOCCULT BLD/STL (OFFICE/1-CARD/DIAGNOSTIC): Fecal Occult Blood, POC: NEGATIVE

## 2022-06-14 ENCOUNTER — Other Ambulatory Visit: Payer: Self-pay | Admitting: Internal Medicine

## 2022-06-27 ENCOUNTER — Ambulatory Visit
Admission: RE | Admit: 2022-06-27 | Discharge: 2022-06-27 | Disposition: A | Discharge status: Home / Self Care | Payer: Medicare HMO | Source: Ambulatory Visit | Attending: Internal Medicine | Admitting: Internal Medicine

## 2022-06-27 DIAGNOSIS — N6342 Unspecified lump in left breast, subareolar: Secondary | ICD-10-CM

## 2022-06-27 DIAGNOSIS — R928 Other abnormal and inconclusive findings on diagnostic imaging of breast: Secondary | ICD-10-CM | POA: Diagnosis not present

## 2022-06-27 DIAGNOSIS — N6489 Other specified disorders of breast: Secondary | ICD-10-CM | POA: Diagnosis not present

## 2022-08-06 ENCOUNTER — Other Ambulatory Visit: Payer: Self-pay | Admitting: Internal Medicine

## 2022-09-26 ENCOUNTER — Ambulatory Visit (INDEPENDENT_AMBULATORY_CARE_PROVIDER_SITE_OTHER): Payer: Medicare HMO

## 2022-09-26 VITALS — BP 124/80 | Ht 62.0 in | Wt 170.0 lb

## 2022-09-26 DIAGNOSIS — Z23 Encounter for immunization: Secondary | ICD-10-CM

## 2022-09-26 NOTE — Progress Notes (Signed)
Pt presents today for first shingrix injection.

## 2022-09-26 NOTE — Patient Instructions (Signed)

## 2022-10-16 ENCOUNTER — Ambulatory Visit (INDEPENDENT_AMBULATORY_CARE_PROVIDER_SITE_OTHER): Payer: Medicare HMO

## 2022-10-16 ENCOUNTER — Encounter: Payer: Self-pay | Admitting: Internal Medicine

## 2022-10-16 ENCOUNTER — Ambulatory Visit (INDEPENDENT_AMBULATORY_CARE_PROVIDER_SITE_OTHER): Payer: Medicare HMO | Admitting: Internal Medicine

## 2022-10-16 ENCOUNTER — Other Ambulatory Visit: Payer: Self-pay

## 2022-10-16 VITALS — BP 130/80 | HR 85 | Temp 101.2°F | Ht 62.6 in | Wt 168.4 lb

## 2022-10-16 VITALS — BP 130/80 | HR 85 | Temp 101.2°F | Ht 62.6 in | Wt 168.0 lb

## 2022-10-16 DIAGNOSIS — I10 Essential (primary) hypertension: Secondary | ICD-10-CM

## 2022-10-16 DIAGNOSIS — U071 COVID-19: Secondary | ICD-10-CM

## 2022-10-16 DIAGNOSIS — Z0000 Encounter for general adult medical examination without abnormal findings: Secondary | ICD-10-CM

## 2022-10-16 LAB — POC COVID19 BINAXNOW: SARS Coronavirus 2 Ag: POSITIVE — AB

## 2022-10-16 MED ORDER — NIRMATRELVIR/RITONAVIR (PAXLOVID)TABLET: 3.0000 | ORAL_TABLET | Freq: Two times a day (BID) | ORAL | 0 refills | Status: DC

## 2022-10-16 MED ORDER — NIRMATRELVIR/RITONAVIR (PAXLOVID)TABLET: 3.0000 | ORAL_TABLET | Freq: Two times a day (BID) | ORAL | 0 refills | Status: AC

## 2022-10-16 MED ORDER — ALBUTEROL SULFATE HFA 108 (90 BASE) MCG/ACT IN AERS: 2.0000 | INHALATION_SPRAY | RESPIRATORY_TRACT | 3 refills | Status: AC | PRN

## 2022-10-16 NOTE — Progress Notes (Signed)
Subjective:   Kathryn Mendez is a 71 y.o. female who presents for Medicare Annual (Subsequent) preventive examination.  Review of Systems     Cardiac Risk Factors include: advanced age (>64mn, >>49women);dyslipidemia;hypertension;obesity (BMI >30kg/m2)     Objective:    Today's Vitals   10/16/22 1456 10/16/22 1511  BP: 130/80   Pulse: 85   Temp: (!) 101.2 F (38.4 C)   TempSrc: Oral   SpO2: 96%   Weight: 168 lb 6.4 oz (76.4 kg)   Height: 5' 2.6" (1.59 m)   PainSc:  7    Body mass index is 30.21 kg/m.     10/16/2022    3:15 PM 09/27/2021   11:06 AM 09/20/2020   12:37 PM 09/16/2019    8:44 AM 09/10/2018    9:05 AM  Advanced Directives  Does Patient Have a Medical Advance Directive? No No No No No  Would patient like information on creating a medical advance directive? No - Patient declined  No - Patient declined No - Patient declined Yes (MAU/Ambulatory/Procedural Areas - Information given)    Current Medications (verified) Outpatient Encounter Medications as of 10/16/2022  Medication Sig   albuterol (VENTOLIN HFA) 108 (90 Base) MCG/ACT inhaler Inhale 2 puffs into the lungs every 4 (four) hours as needed for wheezing or shortness of breath.   allopurinol (ZYLOPRIM) 100 MG tablet TAKE 1 TABLET(100 MG) BY MOUTH DAILY   amLODipine (NORVASC) 5 MG tablet Take 1 tablet (5 mg total) by mouth daily.   Azelastine HCl 0.15 % SOLN 2 sprays per nostril 1-2 times daily as needed for nasal drainage/throat clearing   Cholecalciferol (VITAMIN D) 125 MCG (5000 UT) CAPS Take 1 capsule by mouth daily.   levocetirizine (XYZAL) 5 MG tablet Take 1 tablet (5 mg total) by mouth every evening.   metoprolol succinate (TOPROL-XL) 100 MG 24 hr tablet TAKE 1 TABLET(100 MG) BY MOUTH DAILY WITH OR IMMEDIATELY FOLLOWING A MEAL   Olopatadine HCl 0.2 % SOLN 1 drop per eye daily as needed for itchy/watery eyes   pravastatin (PRAVACHOL) 40 MG tablet TAKE 1 TABLET(40 MG) BY MOUTH EVERY EVENING    triamcinolone (NASACORT) 55 MCG/ACT AERO nasal inhaler 2 sprays per nostril daily for 1-2 weeks at a time before stopping once nasal congestion improves for maximum benefit   Vitamin D, Ergocalciferol, (DRISDOL) 1.25 MG (50000 UNIT) CAPS capsule TAKE 1 CAPSULE BY MOUTH ON TUEDAYS AND FRIDAYS   hydrochlorothiazide (HYDRODIURIL) 25 MG tablet Take 1 tablet (25 mg total) by mouth daily. (Patient not taking: Reported on 10/16/2022)   No facility-administered encounter medications on file as of 10/16/2022.    Allergies (verified) Other   History: Past Medical History:  Diagnosis Date   High cholesterol    Hypertension    Past Surgical History:  Procedure Laterality Date   CHOLECYSTECTOMY     TUBAL LIGATION     Family History  Problem Relation Age of Onset   Diabetes Mother    Hypertension Mother    Diabetes Sister    Social History   Socioeconomic History   Marital status: Married    Spouse name: Not on file   Number of children: Not on file   Years of education: Not on file   Highest education level: Not on file  Occupational History   Occupation: retired  Tobacco Use   Smoking status: Never   Smokeless tobacco: Never  Vaping Use   Vaping Use: Never used  Substance and Sexual Activity  Alcohol use: Not Currently    Comment: occasional   Drug use: No   Sexual activity: Yes  Other Topics Concern   Not on file  Social History Narrative   Not on file   Social Determinants of Health   Financial Resource Strain: Low Risk  (10/16/2022)   Overall Financial Resource Strain (CARDIA)    Difficulty of Paying Living Expenses: Not hard at all  Food Insecurity: No Food Insecurity (10/16/2022)   Hunger Vital Sign    Worried About Running Out of Food in the Last Year: Never true    Ran Out of Food in the Last Year: Never true  Transportation Needs: No Transportation Needs (10/16/2022)   PRAPARE - Hydrologist (Medical): No    Lack of  Transportation (Non-Medical): No  Physical Activity: Insufficiently Active (10/16/2022)   Exercise Vital Sign    Days of Exercise per Week: 2 days    Minutes of Exercise per Session: 20 min  Stress: No Stress Concern Present (10/16/2022)   [place]    Feeling of Stress : Not at all  Social Connections: Not on file    Tobacco Counseling Counseling given: Not Answered   Clinical Intake:  Pre-visit preparation completed: Yes  Pain : 0-10 Pain Score: 7  Pain Type: Acute pain Pain Location: Head Pain Descriptors / Indicators: Aching Pain Onset: In the past 7 days Pain Frequency: Intermittent     Nutritional Status: BMI > 30  Obese Nutritional Risks: None Diabetes: No  How often do you need to have someone help you when you read instructions, pamphlets, or other written materials from your doctor or pharmacy?: 1 - Never  Diabetic? no  Interpreter Needed?: No  Information entered by :: NAllen LPN   Activities of Daily Living    10/16/2022    3:16 PM  In your present state of health, do you have any difficulty performing the following activities:  Hearing? 0  Vision? 0  Difficulty concentrating or making decisions? 0  Walking or climbing stairs? 0  Dressing or bathing? 0  Doing errands, shopping? 0  Preparing Food and eating ? N  Using the Toilet? N  In the past six months, have you accidently leaked urine? N  Do you have problems with loss of bowel control? N  Managing your Medications? N  Managing your Finances? N  Housekeeping or managing your Housekeeping? N    Patient Care Team: Glendale Chard, MD as PCP - General (Internal Medicine) Rutherford Guys, MD as Consulting Physician (Ophthalmology)  Indicate any recent Medical Services you may have received from other than [facility] providers in the past year (date may be approximate).     Assessment:   This is a routine wellness examination for  Kathryn Mendez.  Hearing/Vision screen Vision Screening - Comments:: Regular eye exams, Dr. Gershon Crane  Dietary issues and exercise activities discussed: Current Exercise Habits: Home exercise routine, Type of exercise: walking, Time (Minutes): 20, Frequency (Times/Week): 2, Weekly Exercise (Minutes/Week): 40   Goals Addressed             This Visit's Progress    Patient Stated       10/16/2022, stay healthy       Depression Screen    10/16/2022    3:16 PM 05/21/2022    2:10 PM 09/27/2021   11:08 AM 09/20/2020   12:37 PM 02/01/2020   12:01 PM 09/16/2019    8:45 AM  03/15/2019    9:23 AM  PHQ 2/9 Scores  PHQ - 2 Score 0 2 0 0 0 0 0  PHQ- 9 Score  2    0     Fall Risk    10/16/2022    3:16 PM 05/21/2022    2:10 PM 09/27/2021   11:07 AM 09/20/2020   12:37 PM 02/01/2020   12:01 PM  Fall Risk   Falls in the past year? 0 0 0 0 0  Number falls in past yr: 0 0     Injury with Fall? 0 0     Risk for fall due to : No Fall Risks  Medication side effect Medication side effect   Follow up Falls prevention discussed Falls evaluation completed Falls evaluation completed;Education provided;Falls prevention discussed Falls evaluation completed;Education provided;Falls prevention discussed     FALL RISK PREVENTION PERTAINING TO THE HOME:  Any stairs in or around the home? Yes  If so, are there any without handrails? Yes  Home free of loose throw rugs in walkways, pet beds, electrical cords, etc? Yes  Adequate lighting in your home to reduce risk of falls? Yes   ASSISTIVE DEVICES UTILIZED TO PREVENT FALLS:  Life alert? No  Use of a cane, walker or w/c? No  Grab bars in the bathroom? No  Shower chair or bench in shower? No  Elevated toilet seat or a handicapped toilet? Yes   TIMED UP AND GO:  Was the test performed? Yes .  Length of time to ambulate 10 feet: 5 sec.   Gait steady and fast without use of assistive device  Cognitive Function: 6 CIT not administered. Patient appears  cognitive per direct observation        09/27/2021   11:08 AM 09/20/2020   12:38 PM 09/16/2019    8:46 AM 09/10/2018    9:09 AM  6CIT Screen  What Year? 0 points 0 points 0 points 0 points  What month? 0 points 0 points 0 points 0 points  What time? 0 points 0 points 0 points 0 points  Count back from 20 0 points 0 points 0 points 0 points  Months in reverse 4 points 4 points 0 points 4 points  Repeat phrase 8 points 2 points 4 points 2 points  Total Score 12 points 6 points 4 points 6 points    Immunizations Immunization History  Administered Date(s) Administered   Fluad Quad(high Dose 65+) 07/11/2020   Influenza, High Dose Seasonal PF 08/31/2018, 07/16/2019   Influenza-Unspecified 08/28/2018, 07/23/2021   PFIZER(Purple Top)SARS-COV-2 Vaccination 12/24/2019, 01/20/2020, 08/14/2020   PNEUMOCOCCAL CONJUGATE-20 10/04/2021   Pneumococcal Polysaccharide-23 09/10/2018   Zoster Recombinat (Shingrix) 09/26/2022    TDAP status: Due, Education has been provided regarding the importance of this vaccine. Advised may receive this vaccine at local pharmacy or Health Dept. Aware to provide a copy of the vaccination record if obtained from local pharmacy or Health Dept. Verbalized acceptance and understanding.  Flu Vaccine status: Up to date  Pneumococcal vaccine status: Up to date  Covid-19 vaccine status: Completed vaccines  Qualifies for Shingles Vaccine? Yes   Zostavax completed Yes   Shingrix Completed?: Yes  Screening Tests Health Maintenance  Topic Date Due   DTaP/Tdap/Td (1 - Tdap) Never done   INFLUENZA VACCINE  05/28/2022   COVID-19 Vaccine (4 - 2023-24 season) 06/28/2022   Medicare Annual Wellness (AWV)  09/27/2022   Zoster Vaccines- Shingrix (2 of 2) 11/21/2022   MAMMOGRAM  11/27/2023   COLONOSCOPY (Pts  45-30yr Insurance coverage will need to be confirmed)  04/15/2028   Pneumonia Vaccine 71 Years old  Completed   DEXA SCAN  Completed   Hepatitis C Screening   Completed   HPV VACCINES  Aged Out    Health Maintenance  Health Maintenance Due  Topic Date Due   DTaP/Tdap/Td (1 - Tdap) Never done   INFLUENZA VACCINE  05/28/2022   COVID-19 Vaccine (4 - 2023-24 season) 06/28/2022   Medicare Annual Wellness (AWV)  09/27/2022    Colorectal cancer screening: Type of screening: Colonoscopy. Completed 04/15/2018. Repeat every 10 years  Mammogram status: Completed 11/26/2021. Repeat every year  Bone Density status: Completed 09/04/2017.  Lung Cancer Screening: (Low Dose CT Chest recommended if Age 71-80years, 30 pack-year currently smoking OR have quit w/in 15years.) does not qualify.   Lung Cancer Screening Referral: no  Additional Screening:  Hepatitis C Screening: does qualify; Completed 03/14/2014  Vision Screening: Recommended annual ophthalmology exams for early detection of glaucoma and other disorders of the eye. Is the patient up to date with their annual eye exam?  Yes  Who is the provider or what is the name of the office in which the patient attends annual eye exams? Dr. SGershon CraneIf pt is not established with a provider, would they like to be referred to a provider to establish care? No .   Dental Screening: Recommended annual dental exams for proper oral hygiene  Community Resource Referral / Chronic Care Management: CRR required this visit?  No   CCM required this visit?  No      Plan:     I have personally reviewed and noted the following in the patient's chart:   Medical and social history Use of alcohol, tobacco or illicit drugs  Current medications and supplements including opioid prescriptions. Patient is not currently taking opioid prescriptions. Functional ability and status Nutritional status Physical activity Advanced directives List of other physicians Hospitalizations, surgeries, and ER visits in previous 12 months Vitals Screenings to include cognitive, depression, and falls Referrals and  appointments  In addition, I have reviewed and discussed with patient certain preventive protocols, quality metrics, and best practice recommendations. A written personalized care plan for preventive services as well as general preventive health recommendations were provided to patient.     NKellie Simmering LPN   [id number]  Nurse Notes: none

## 2022-10-16 NOTE — Patient Instructions (Signed)
Kathryn Mendez , Thank you for taking time to come for your Medicare Wellness Visit. I appreciate your ongoing commitment to your health goals. Please review the following plan we discussed and let me know if I can assist you in the future.   These are the goals we discussed:  Goals       Patient Stated      09/16/2019, just to stay healthy      Patient Stated      11/21/2019, lose 10 pounds      Patient Stated      09/27/2021, eat healthier and lose some weight      Weight (lb) < 200 lb (90.7 kg) (pt-stated)      Just watching weight        This is a list of the screening recommended for you and due dates:  Health Maintenance  Topic Date Due   DTaP/Tdap/Td vaccine (1 - Tdap) Never done   Flu Shot  05/28/2022   COVID-19 Vaccine (4 - 2023-24 season) 06/28/2022   Medicare Annual Wellness Visit  09/27/2022   Zoster (Shingles) Vaccine (2 of 2) 11/21/2022   Mammogram  11/27/2023   Colon Cancer Screening  04/15/2028   Pneumonia Vaccine  Completed   DEXA scan (bone density measurement)  Completed   Hepatitis C Screening: USPSTF Recommendation to screen - Ages 1-79 yo.  Completed   HPV Vaccine  Aged Out    Advanced directives: Advance directive discussed with you today. Even though you declined this today please call our office should you change your mind and we can give you the proper paperwork for you to fill out.  Conditions/risks identified: temp of 101.2  Next appointment: Follow up in one year for your annual wellness visit    Preventive Care 65 Years and Older, Female Preventive care refers to lifestyle choices and visits with your health care provider that can promote health and wellness. What does preventive care include? A yearly physical exam. This is also called an annual well check. Dental exams once or twice a year. Routine eye exams. Ask your health care provider how often you should have your eyes checked. Personal lifestyle choices, including: Daily care of  your teeth and gums. Regular physical activity. Eating a healthy diet. Avoiding tobacco and drug use. Limiting alcohol use. Practicing safe sex. Taking low-dose aspirin every day. Taking vitamin and mineral supplements as recommended by your health care provider. What happens during an annual well check? The services and screenings done by your health care provider during your annual well check will depend on your age, overall health, lifestyle risk factors, and family history of disease. Counseling  Your health care provider may ask you questions about your: Alcohol use. Tobacco use. Drug use. Emotional well-being. Home and relationship well-being. Sexual activity. Eating habits. History of falls. Memory and ability to understand (cognition). Work and work Statistician. Reproductive health. Screening  You may have the following tests or measurements: Height, weight, and BMI. Blood pressure. Lipid and cholesterol levels. These may be checked every 5 years, or more frequently if you are over 68 years old. Skin check. Lung cancer screening. You may have this screening every year starting at age 54 if you have a 30-pack-year history of smoking and currently smoke or have quit within the past 15 years. Fecal occult blood test (FOBT) of the stool. You may have this test every year starting at age 22. Flexible sigmoidoscopy or colonoscopy. You may have a sigmoidoscopy every 5  years or a colonoscopy every 10 years starting at age 96. Hepatitis C blood test. Hepatitis B blood test. Sexually transmitted disease (STD) testing. Diabetes screening. This is done by checking your blood sugar (glucose) after you have not eaten for a while (fasting). You may have this done every 1-3 years. Bone density scan. This is done to screen for osteoporosis. You may have this done starting at age 76. Mammogram. This may be done every 1-2 years. Talk to your health care provider about how often you should  have regular mammograms. Talk with your health care provider about your test results, treatment options, and if necessary, the need for more tests. Vaccines  Your health care provider may recommend certain vaccines, such as: Influenza vaccine. This is recommended every year. Tetanus, diphtheria, and acellular pertussis (Tdap, Td) vaccine. You may need a Td booster every 10 years. Zoster vaccine. You may need this after age 29. Pneumococcal 13-valent conjugate (PCV13) vaccine. One dose is recommended after age 59. Pneumococcal polysaccharide (PPSV23) vaccine. One dose is recommended after age 18. Talk to your health care provider about which screenings and vaccines you need and how often you need them. This information is not intended to replace advice given to you by your health care provider. Make sure you discuss any questions you have with your health care provider. Document Released: 11/10/2015 Document Revised: 07/03/2016 Document Reviewed: 08/15/2015 Elsevier Interactive Patient Education  2017 [place] Prevention in the Home Falls can cause injuries. They can happen to people of all ages. There are many things you can do to make your home safe and to help prevent falls. What can I do on the outside of my home? Regularly fix the edges of walkways and driveways and fix any cracks. Remove anything that might make you trip as you walk through a door, such as a raised step or threshold. Trim any bushes or trees on the path to your home. Use bright outdoor lighting. Clear any walking paths of anything that might make someone trip, such as rocks or tools. Regularly check to see if handrails are loose or broken. Make sure that both sides of any steps have handrails. Any raised decks and porches should have guardrails on the edges. Have any leaves, snow, or ice cleared regularly. Use sand or salt on walking paths during winter. Clean up any spills in your garage right away. This  includes oil or grease spills. What can I do in the bathroom? Use night lights. Install grab bars by the toilet and in the tub and shower. Do not use towel bars as grab bars. Use non-skid mats or decals in the tub or shower. If you need to sit down in the shower, use a plastic, non-slip stool. Keep the floor dry. Clean up any water that spills on the floor as soon as it happens. Remove soap buildup in the tub or shower regularly. Attach bath mats securely with double-sided non-slip rug tape. Do not have throw rugs and other things on the floor that can make you trip. What can I do in the bedroom? Use night lights. Make sure that you have a light by your bed that is easy to reach. Do not use any sheets or blankets that are too big for your bed. They should not hang down onto the floor. Have a firm chair that has side arms. You can use this for support while you get dressed. Do not have throw rugs and other things on the  floor that can make you trip. What can I do in the kitchen? Clean up any spills right away. Avoid walking on wet floors. Keep items that you use a lot in easy-to-reach places. If you need to reach something above you, use a strong step stool that has a grab bar. Keep electrical cords out of the way. Do not use floor polish or wax that makes floors slippery. If you must use wax, use non-skid floor wax. Do not have throw rugs and other things on the floor that can make you trip. What can I do with my stairs? Do not leave any items on the stairs. Make sure that there are handrails on both sides of the stairs and use them. Fix handrails that are broken or loose. Make sure that handrails are as long as the stairways. Check any carpeting to make sure that it is firmly attached to the stairs. Fix any carpet that is loose or worn. Avoid having throw rugs at the top or bottom of the stairs. If you do have throw rugs, attach them to the floor with carpet tape. Make sure that you  have a light switch at the top of the stairs and the bottom of the stairs. If you do not have them, ask someone to add them for you. What else can I do to help prevent falls? Wear shoes that: Do not have high heels. Have rubber bottoms. Are comfortable and fit you well. Are closed at the toe. Do not wear sandals. If you use a stepladder: Make sure that it is fully opened. Do not climb a closed stepladder. Make sure that both sides of the stepladder are locked into place. Ask someone to hold it for you, if possible. Clearly mark and make sure that you can see: Any grab bars or handrails. First and last steps. Where the edge of each step is. Use tools that help you move around (mobility aids) if they are needed. These include: Canes. Walkers. Scooters. Crutches. Turn on the lights when you go into a dark area. Replace any light bulbs as soon as they burn out. Set up your furniture so you have a clear path. Avoid moving your furniture around. If any of your floors are uneven, fix them. If there are any pets around you, be aware of where they are. Review your medicines with your doctor. Some medicines can make you feel dizzy. This can increase your chance of falling. Ask your doctor what other things that you can do to help prevent falls. This information is not intended to replace advice given to you by your health care provider. Make sure you discuss any questions you have with your health care provider. Document Released: 08/10/2009 Document Revised: 03/21/2016 Document Reviewed: 11/18/2014 Elsevier Interactive Patient Education  2017 [employer].

## 2022-10-16 NOTE — Progress Notes (Signed)
Rich Brave [place],acting as a Education administrator for [name], MD.,have documented all relevant documentation on the behalf of [name], MD,as directed by  [name], MD while in the presence of [name], MD.    Subjective:     Patient ID: Kathryn Mendez , female    DOB: 1950-10-31 , 71 y.o.   MRN: [id number]   Chief Complaint  Patient presents with   Fever    HPI  Patient presented today for AWV and was found to have a fever. Vitals show temp of 101.2.  She agreed to be seen for further evaluation. She states she developed chills yesterday, she never took her temperature to see if she had a fever. She denies known ill contacts. Today, she developed headache, dry cough and body aches.   Fever  This is a new problem. The current episode started yesterday. The maximum temperature noted was 101 to 101.9 F. The temperature was taken using an oral thermometer. Associated symptoms include congestion and coughing. Pertinent negatives include no chest pain. She has tried nothing for the symptoms. The treatment provided no relief.     Past Medical History:  Diagnosis Date   High cholesterol    Hypertension      Family History  Problem Relation Age of Onset   Diabetes Mother    Hypertension Mother    Diabetes Sister      Current Outpatient Medications:    albuterol (VENTOLIN HFA) 108 (90 Base) MCG/ACT inhaler, Inhale 2 puffs into the lungs every 4 (four) hours as needed for wheezing or shortness of breath., Disp: 18 g, Rfl: 3   allopurinol (ZYLOPRIM) 100 MG tablet, TAKE 1 TABLET(100 MG) BY MOUTH DAILY, Disp: 90 tablet, Rfl: 2   amLODipine (NORVASC) 5 MG tablet, Take 1 tablet (5 mg total) by mouth daily., Disp: 90 tablet, Rfl: 2   Azelastine HCl 0.15 % SOLN, 2 sprays per nostril 1-2 times daily as needed for nasal drainage/throat clearing, Disp: 30 mL, Rfl: 5   Cholecalciferol (VITAMIN D) 125 MCG (5000 UT) CAPS, Take 1 capsule by mouth daily., Disp: , Rfl:     hydrochlorothiazide (HYDRODIURIL) 25 MG tablet, Take 1 tablet (25 mg total) by mouth daily. (Patient not taking: Reported on 10/16/2022), Disp: 90 tablet, Rfl: 1   levocetirizine (XYZAL) 5 MG tablet, Take 1 tablet (5 mg total) by mouth every evening., Disp: 30 tablet, Rfl: 5   metoprolol succinate (TOPROL-XL) 100 MG 24 hr tablet, TAKE 1 TABLET(100 MG) BY MOUTH DAILY WITH OR IMMEDIATELY FOLLOWING A MEAL, Disp: 90 tablet, Rfl: 1   nirmatrelvir/ritonavir (PAXLOVID) 20 x 150 MG & 10 x 100MG TABS, Take 3 tablets by mouth 2 (two) times daily for 5 days. Patient GFR is 71. Take nirmatrelvir (150 mg) two tablets twice daily for 5 days and ritonavir (100 mg) one tablet twice daily for 5 days., Disp: 30 tablet, Rfl: 0   Olopatadine HCl 0.2 % SOLN, 1 drop per eye daily as needed for itchy/watery eyes, Disp: 2.5 mL, Rfl: 5   pravastatin (PRAVACHOL) 40 MG tablet, TAKE 1 TABLET(40 MG) BY MOUTH EVERY EVENING, Disp: 90 tablet, Rfl: 2   triamcinolone (NASACORT) 55 MCG/ACT AERO nasal inhaler, 2 sprays per nostril daily for 1-2 weeks at a time before stopping once nasal congestion improves for maximum benefit, Disp: 1 each, Rfl: 5   Vitamin D, Ergocalciferol, (DRISDOL) 1.25 MG (50000 UNIT) CAPS capsule, TAKE 1 CAPSULE BY MOUTH ON TUEDAYS AND FRIDAYS, Disp: 24  capsule, Rfl: 0   Allergies  Allergen Reactions   Other     Food allergies     Review of Systems  Constitutional:  Positive for fever.  HENT:  Positive for congestion.   Respiratory:  Positive for cough.   Cardiovascular: Negative.  Negative for chest pain.  Gastrointestinal: Negative.   Neurological: Negative.   Psychiatric/Behavioral: Negative.       Today's Vitals   10/16/22 1517  BP: 130/80  Pulse: 85  Temp: (!) 101.2 F (38.4 C)  Weight: 168 lb (76.2 kg)  Height: 5' 2.6" (1.59 m)   Body mass index is 30.14 kg/m.   Objective:  Physical Exam Vitals and nursing note reviewed.  Constitutional:      Appearance: Normal appearance. She is  ill-appearing.  HENT:     Head: Normocephalic and atraumatic.     Nose:     Comments: Masked     Mouth/Throat:     Comments: Masked  Eyes:     Extraocular Movements: Extraocular movements intact.  Cardiovascular:     Rate and Rhythm: Normal rate and regular rhythm.     Heart sounds: Normal heart sounds.  Pulmonary:     Effort: Pulmonary effort is normal.     Breath sounds: Normal breath sounds.     Comments: She is able to speak in full sentences.  Skin:    General: Skin is warm.  Neurological:     General: No focal deficit present.     Mental Status: She is alert.  Psychiatric:        Mood and Affect: Mood normal.        Behavior: Behavior normal.         Assessment And Plan:     1. COVID Comments: She agrees to treatment, will send rx Paxlovid to the pharmacy.  She is advised to stop her cholesterol meds while on Paxlovid. Possible AE d/w pt.  Advised patient to take Vitamin C, D, Zinc.  Keep yourself hydrated with a lot of water and rest. Take Delsym for cough and Mucinex as needed. Take Tylenol or pain reliever every 4-6 hours as needed for pain/fever/body ache. If you have elevated blood pressure, you can take OTC Coricidin as needed . You can also take OTC oscillococcinum, a homeopathic remedy, to help with your symptoms.  Educated patient if symptoms get worse or if she experiences any SOB, chest pain or pain in her legs to seek immediate emergency care. Continue to monitor your oxygen levels. Call us if you have any questions. Quarantine for 5 days if tested positive and no symptoms or 10 days if tested positive and have symptoms. Wear a mask around other people.   - POC COVID-19  2. Essential hypertension Comments: Chronic, fair control. She will c/w current meds. I wlil check renal function today. - CMP14+EGFR   Patient was given opportunity to ask questions. Patient verbalized understanding of the plan and was able to repeat key elements of the plan. All questions  were answered to their satisfaction.   I, [name], MD, have reviewed all documentation for this visit. The documentation on 10/16/22 for the exam, diagnosis, procedures, and orders are all accurate and complete.   IF YOU HAVE BEEN REFERRED TO A SPECIALIST, IT MAY TAKE 1-2 WEEKS TO SCHEDULE/PROCESS THE REFERRAL. IF YOU HAVE NOT HEARD FROM US/SPECIALIST IN TWO WEEKS, PLEASE GIVE [place] A CALL AT [phone].   THE PATIENT IS ENCOURAGED TO PRACTICE SOCIAL DISTANCING  DUE TO THE COVID-19 PANDEMIC.

## 2022-10-16 NOTE — Patient Instructions (Signed)

## 2022-10-17 LAB — CMP14+EGFR
ALT: 14 IU/L (ref 0–32)
AST: 17 IU/L (ref 0–40)
Albumin/Globulin Ratio: 1.4 (ref 1.2–2.2)
Albumin: 3.9 g/dL (ref 3.8–4.8)
Alkaline Phosphatase: 86 IU/L (ref 44–121)
BUN/Creatinine Ratio: 12 (ref 12–28)
BUN: 11 mg/dL (ref 8–27)
Bilirubin Total: 0.4 mg/dL (ref 0.0–1.2)
CO2: 24 mmol/L (ref 20–29)
Calcium: 8.8 mg/dL (ref 8.7–10.3)
Chloride: 105 mmol/L (ref 96–106)
Creatinine, Ser: 0.89 mg/dL (ref 0.57–1.00)
Globulin, Total: 2.7 g/dL (ref 1.5–4.5)
Glucose: 95 mg/dL (ref 70–99)
Potassium: 3.6 mmol/L (ref 3.5–5.2)
Sodium: 143 mmol/L (ref 134–144)
Total Protein: 6.6 g/dL (ref 6.0–8.5)
eGFR: 69 mL/min/{1.73_m2} (ref >59)

## 2022-10-28 HISTORY — PX: OTHER SURGICAL HISTORY: SHX169

## 2022-11-07 ENCOUNTER — Other Ambulatory Visit: Payer: Self-pay | Admitting: Internal Medicine

## 2022-11-07 ENCOUNTER — Other Ambulatory Visit: Payer: Self-pay

## 2022-11-07 MED ORDER — METOPROLOL SUCCINATE ER 100 MG PO TB24: ORAL_TABLET | ORAL | 1 refills | Status: DC

## 2022-11-15 ENCOUNTER — Other Ambulatory Visit: Payer: Self-pay | Admitting: Internal Medicine

## 2022-11-15 DIAGNOSIS — Z1231 Encounter for screening mammogram for malignant neoplasm of breast: Secondary | ICD-10-CM

## 2022-11-25 DIAGNOSIS — H52203 Unspecified astigmatism, bilateral: Secondary | ICD-10-CM | POA: Diagnosis not present

## 2022-11-25 DIAGNOSIS — H524 Presbyopia: Secondary | ICD-10-CM | POA: Diagnosis not present

## 2022-11-25 DIAGNOSIS — H25813 Combined forms of age-related cataract, bilateral: Secondary | ICD-10-CM | POA: Diagnosis not present

## 2022-11-26 ENCOUNTER — Ambulatory Visit (INDEPENDENT_AMBULATORY_CARE_PROVIDER_SITE_OTHER): Payer: Medicare HMO

## 2022-11-26 VITALS — BP 138/88 | Temp 98.1°F | Ht 62.0 in | Wt 168.0 lb

## 2022-11-26 DIAGNOSIS — Z23 Encounter for immunization: Secondary | ICD-10-CM

## 2022-11-26 NOTE — Progress Notes (Signed)
Pt presents today for second shingles.

## 2023-01-07 ENCOUNTER — Ambulatory Visit: Payer: Medicare HMO

## 2023-01-08 DIAGNOSIS — Z48810 Encounter for surgical aftercare following surgery on the sense organs: Secondary | ICD-10-CM | POA: Diagnosis not present

## 2023-01-08 DIAGNOSIS — Z961 Presence of intraocular lens: Secondary | ICD-10-CM | POA: Diagnosis not present

## 2023-01-08 DIAGNOSIS — H2511 Age-related nuclear cataract, right eye: Secondary | ICD-10-CM | POA: Diagnosis not present

## 2023-01-08 DIAGNOSIS — H2512 Age-related nuclear cataract, left eye: Secondary | ICD-10-CM | POA: Diagnosis not present

## 2023-01-15 DIAGNOSIS — H268 Other specified cataract: Secondary | ICD-10-CM | POA: Diagnosis not present

## 2023-01-15 DIAGNOSIS — Z961 Presence of intraocular lens: Secondary | ICD-10-CM | POA: Diagnosis not present

## 2023-01-15 DIAGNOSIS — H25011 Cortical age-related cataract, right eye: Secondary | ICD-10-CM | POA: Diagnosis not present

## 2023-01-15 DIAGNOSIS — H2511 Age-related nuclear cataract, right eye: Secondary | ICD-10-CM | POA: Diagnosis not present

## 2023-01-15 DIAGNOSIS — Z48810 Encounter for surgical aftercare following surgery on the sense organs: Secondary | ICD-10-CM | POA: Diagnosis not present

## 2023-01-24 ENCOUNTER — Emergency Department (HOSPITAL_COMMUNITY): Payer: Medicare HMO

## 2023-01-24 ENCOUNTER — Encounter (HOSPITAL_COMMUNITY): Payer: Self-pay | Admitting: Cardiology

## 2023-01-24 ENCOUNTER — Other Ambulatory Visit: Payer: Self-pay

## 2023-01-24 ENCOUNTER — Inpatient Hospital Stay (HOSPITAL_COMMUNITY)
Admission: EM | Admit: 2023-01-24 | Discharge: 2023-01-27 | DRG: 311 | Disposition: A | Discharge status: Home / Self Care | Payer: Medicare HMO | Attending: Cardiology | Admitting: Cardiology

## 2023-01-24 DIAGNOSIS — E876 Hypokalemia: Secondary | ICD-10-CM | POA: Diagnosis present

## 2023-01-24 DIAGNOSIS — Z833 Family history of diabetes mellitus: Secondary | ICD-10-CM | POA: Diagnosis not present

## 2023-01-24 DIAGNOSIS — R079 Chest pain, unspecified: Secondary | ICD-10-CM | POA: Diagnosis not present

## 2023-01-24 DIAGNOSIS — I10 Essential (primary) hypertension: Secondary | ICD-10-CM | POA: Diagnosis not present

## 2023-01-24 DIAGNOSIS — W19XXXA Unspecified fall, initial encounter: Secondary | ICD-10-CM | POA: Diagnosis not present

## 2023-01-24 DIAGNOSIS — I2089 Other forms of angina pectoris: Principal | ICD-10-CM | POA: Diagnosis present

## 2023-01-24 DIAGNOSIS — I200 Unstable angina: Principal | ICD-10-CM

## 2023-01-24 DIAGNOSIS — R29818 Other symptoms and signs involving the nervous system: Secondary | ICD-10-CM | POA: Diagnosis not present

## 2023-01-24 DIAGNOSIS — R9431 Abnormal electrocardiogram [ECG] [EKG]: Secondary | ICD-10-CM | POA: Diagnosis present

## 2023-01-24 DIAGNOSIS — R002 Palpitations: Secondary | ICD-10-CM | POA: Diagnosis present

## 2023-01-24 DIAGNOSIS — M79602 Pain in left arm: Secondary | ICD-10-CM | POA: Diagnosis present

## 2023-01-24 DIAGNOSIS — Z79899 Other long term (current) drug therapy: Secondary | ICD-10-CM

## 2023-01-24 DIAGNOSIS — Z8249 Family history of ischemic heart disease and other diseases of the circulatory system: Secondary | ICD-10-CM | POA: Diagnosis not present

## 2023-01-24 DIAGNOSIS — R072 Precordial pain: Secondary | ICD-10-CM | POA: Diagnosis not present

## 2023-01-24 DIAGNOSIS — I700 Atherosclerosis of aorta: Secondary | ICD-10-CM | POA: Diagnosis not present

## 2023-01-24 DIAGNOSIS — I119 Hypertensive heart disease without heart failure: Secondary | ICD-10-CM | POA: Diagnosis present

## 2023-01-24 DIAGNOSIS — G4489 Other headache syndrome: Secondary | ICD-10-CM | POA: Diagnosis not present

## 2023-01-24 DIAGNOSIS — R7303 Prediabetes: Secondary | ICD-10-CM | POA: Diagnosis present

## 2023-01-24 DIAGNOSIS — E782 Mixed hyperlipidemia: Secondary | ICD-10-CM | POA: Diagnosis not present

## 2023-01-24 DIAGNOSIS — R519 Headache, unspecified: Secondary | ICD-10-CM | POA: Diagnosis not present

## 2023-01-24 DIAGNOSIS — R0789 Other chest pain: Secondary | ICD-10-CM | POA: Diagnosis not present

## 2023-01-24 LAB — CBC
HCT: 45.3 % (ref 36.0–46.0)
HCT: 45.6 % (ref 36.0–46.0)
Hemoglobin: 14.3 g/dL (ref 12.0–15.0)
Hemoglobin: 15.5 g/dL — ABNORMAL HIGH (ref 12.0–15.0)
MCH: 26.9 pg (ref 26.0–34.0)
MCH: 28.1 pg (ref 26.0–34.0)
MCHC: 31.6 g/dL (ref 30.0–36.0)
MCHC: 34 g/dL (ref 30.0–36.0)
MCV: 85.3 fL (ref 80.0–100.0)
MCV: 82.6 fL (ref 80.0–100.0)
Platelets: 349 10*3/uL (ref 150–400)
Platelets: 350 10*3/uL (ref 150–400)
RBC: 5.31 MIL/uL — ABNORMAL HIGH (ref 3.87–5.11)
RBC: 5.52 MIL/uL — ABNORMAL HIGH (ref 3.87–5.11)
RDW: 13 % (ref 11.5–15.5)
RDW: 12.8 % (ref 11.5–15.5)
WBC: 5 10*3/uL (ref 4.0–10.5)
WBC: 5.9 10*3/uL (ref 4.0–10.5)
nRBC: 0 % (ref 0.0–0.2)
nRBC: 0 % (ref 0.0–0.2)

## 2023-01-24 LAB — CREATININE, SERUM
Creatinine, Ser: 0.78 mg/dL (ref 0.44–1.00)
GFR, Estimated: >60 mL/min (ref >60)

## 2023-01-24 LAB — BASIC METABOLIC PANEL
Anion gap: 10 (ref 5–15)
BUN: 10 mg/dL (ref 8–23)
CO2: 25 mmol/L (ref 22–32)
Calcium: 9.1 mg/dL (ref 8.9–10.3)
Chloride: 104 mmol/L (ref 98–111)
Creatinine, Ser: 0.87 mg/dL (ref 0.44–1.00)
GFR, Estimated: >60 mL/min (ref >60)
Glucose, Bld: 122 mg/dL — ABNORMAL HIGH (ref 70–99)
Potassium: 3.3 mmol/L — ABNORMAL LOW (ref 3.5–5.1)
Sodium: 139 mmol/L (ref 135–145)

## 2023-01-24 LAB — TROPONIN I (HIGH SENSITIVITY)
Troponin I (High Sensitivity): 6 ng/L (ref <18)
Troponin I (High Sensitivity): 6 ng/L (ref <18)

## 2023-01-24 LAB — MAGNESIUM: Magnesium: 1.8 mg/dL (ref 1.7–2.4)

## 2023-01-24 LAB — TSH: TSH: 2.164 u[IU]/mL (ref 0.350–4.500)

## 2023-01-24 MED ORDER — CEFAZOLIN SODIUM-DEXTROSE 2-4 GM/100ML-% IV SOLN
INTRAVENOUS | Status: AC
  Filled 2023-01-24: qty 100

## 2023-01-24 MED ORDER — PRAVASTATIN SODIUM 40 MG PO TABS
40.0000 mg | ORAL_TABLET | Freq: Every day | ORAL | Status: DC
  Administered 2023-01-25 – 2023-01-26 (×2): 40 mg via ORAL
  Filled 2023-01-24 (×2): qty 1

## 2023-01-24 MED ORDER — ACETAMINOPHEN 325 MG PO TABS
650.0000 mg | ORAL_TABLET | ORAL | Status: DC | PRN
  Administered 2023-01-24 – 2023-01-25 (×2): 650 mg via ORAL
  Filled 2023-01-24 (×2): qty 2

## 2023-01-24 MED ORDER — METOPROLOL SUCCINATE ER 100 MG PO TB24
100.0000 mg | ORAL_TABLET | Freq: Every day | ORAL | Status: DC
  Administered 2023-01-25 – 2023-01-27 (×3): 100 mg via ORAL
  Filled 2023-01-24 (×3): qty 1

## 2023-01-24 MED ORDER — ASPIRIN 81 MG PO TBEC
81.0000 mg | DELAYED_RELEASE_TABLET | Freq: Every day | ORAL | Status: DC
  Administered 2023-01-25 – 2023-01-27 (×3): 81 mg via ORAL
  Filled 2023-01-24 (×4): qty 1

## 2023-01-24 MED ORDER — ALBUTEROL SULFATE (2.5 MG/3ML) 0.083% IN NEBU: 2.5000 mg | INHALATION_SOLUTION | RESPIRATORY_TRACT | Status: DC | PRN

## 2023-01-24 MED ORDER — METOPROLOL SUCCINATE ER 25 MG PO TB24
100.0000 mg | ORAL_TABLET | Freq: Once | ORAL | Status: AC
  Administered 2023-01-24: 100 mg via ORAL
  Filled 2023-01-24: qty 4

## 2023-01-24 MED ORDER — POTASSIUM CHLORIDE CRYS ER 20 MEQ PO TBCR
40.0000 meq | EXTENDED_RELEASE_TABLET | Freq: Once | ORAL | Status: AC
  Administered 2023-01-24: 40 meq via ORAL
  Filled 2023-01-24: qty 2

## 2023-01-24 MED ORDER — AMLODIPINE BESYLATE 5 MG PO TABS
5.0000 mg | ORAL_TABLET | Freq: Every day | ORAL | Status: DC
  Administered 2023-01-24 – 2023-01-25 (×2): 5 mg via ORAL
  Filled 2023-01-24 (×2): qty 1

## 2023-01-24 MED ORDER — ONDANSETRON HCL 4 MG/2ML IJ SOLN: 4.0000 mg | Freq: Four times a day (QID) | INTRAMUSCULAR | Status: DC | PRN

## 2023-01-24 MED ORDER — ALLOPURINOL 100 MG PO TABS
100.0000 mg | ORAL_TABLET | Freq: Every day | ORAL | Status: DC
  Administered 2023-01-24 – 2023-01-27 (×4): 100 mg via ORAL
  Filled 2023-01-24 (×4): qty 1

## 2023-01-24 MED ORDER — HEPARIN SODIUM (PORCINE) 5000 UNIT/ML IJ SOLN
5000.0000 [IU] | Freq: Three times a day (TID) | INTRAMUSCULAR | Status: DC
  Administered 2023-01-24 – 2023-01-27 (×8): 5000 [IU] via SUBCUTANEOUS
  Filled 2023-01-24 (×8): qty 1

## 2023-01-24 MED ORDER — NITROGLYCERIN 0.4 MG SL SUBL
0.4000 mg | SUBLINGUAL_TABLET | SUBLINGUAL | Status: DC | PRN
  Filled 2023-01-24: qty 1

## 2023-01-24 MED ORDER — CHLORHEXIDINE GLUCONATE 0.12 % MT SOLN
OROMUCOSAL | Status: AC
  Filled 2023-01-24: qty 15

## 2023-01-24 NOTE — ED Triage Notes (Signed)
Pt BIB EMS from doctor office (lives at home) for intermittent chest pain for 1 week that radiates to left arm, back, face & headache at "top of forehead". Pt reports improved pain with rest. MD office gave 324 ASA and one dose of nitro which improved pain. Pt presents with speech impairment. Per EMS, pt and gurney fell over, pt landed on left arm- mild abrasion. No head trauma or LOC.   EMS VS EKG NSR 230/100 68 HR 98% RA 97.41F

## 2023-01-24 NOTE — Progress Notes (Signed)
CARDIOLOGY ADMISSION NOTE  Patient ID: Kathryn Mendez MRN: [id number] DOB/AGE: 72-Dec-1952 72 y.o.  Admit date: 01/24/2023 Primary Physician   Glendale Chard, MD Primary Cardiologist   None Chief Complaint    None  HPI:   The patient has no past cardiac history other than a distant stress test.  She reports being active doing housework including vacuuming and pushing a mower.  She reports no symptoms until about two weeks ago.  She started having some left arm pain. This was a pressure from the shoulder to the elbow.  She might have had some vague chest discomfort and also has had some neck pain and a sensation in her head.  Today she took her BP when out shopping and noted it to be high which is why she came to the ED.  She said she has not had this kind of discomfort before.  It seems to come and go.  She might get it when she is out walking like at the store today.  There are no associated symptoms.  She seems to indicate that some of it might get worse if she tries to raise her left arm.  She wondered if she rested in bed on it is an odd way.  There has been no new shortness of breath, PND or orthopnea. There have been no reported presyncope or syncope.  She has occasional palpitations.   In the ED she mentioned a headache and had a head CT with no acute findings.  Enzymes were negative.  EKG demonstrates no acute changes.   Past Medical History:  Diagnosis Date   High cholesterol    Hypertension     Past Surgical History:  Procedure Laterality Date   CHOLECYSTECTOMY     TUBAL LIGATION      Allergies  Allergen Reactions   Other     Food allergies   No current facility-administered medications on file prior to encounter.   Current Outpatient Medications on File Prior to Encounter  Medication Sig Dispense Refill   albuterol (VENTOLIN HFA) 108 (90 Base) MCG/ACT inhaler Inhale 2 puffs into the lungs every 4 (four) hours as needed for wheezing or shortness of breath. 18 g 3    allopurinol (ZYLOPRIM) 100 MG tablet TAKE 1 TABLET(100 MG) BY MOUTH DAILY 90 tablet 2   amLODipine (NORVASC) 5 MG tablet Take 1 tablet (5 mg total) by mouth daily. 90 tablet 2   Azelastine HCl 0.15 % SOLN 2 sprays per nostril 1-2 times daily as needed for nasal drainage/throat clearing 30 mL 5   Cholecalciferol (VITAMIN D) 125 MCG (5000 UT) CAPS Take 1 capsule by mouth daily.     hydrochlorothiazide (HYDRODIURIL) 25 MG tablet Take 1 tablet (25 mg total) by mouth daily. (Patient not taking: Reported on 10/16/2022) 90 tablet 1   levocetirizine (XYZAL) 5 MG tablet Take 1 tablet (5 mg total) by mouth every evening. 30 tablet 5   metoprolol succinate (TOPROL-XL) 100 MG 24 hr tablet Take with or immediately following a meal. 90 tablet 1   Olopatadine HCl 0.2 % SOLN 1 drop per eye daily as needed for itchy/watery eyes 2.5 mL 5   pravastatin (PRAVACHOL) 40 MG tablet TAKE 1 TABLET(40 MG) BY MOUTH EVERY EVENING 90 tablet 2   triamcinolone (NASACORT) 55 MCG/ACT AERO nasal inhaler 2 sprays per nostril daily for 1-2 weeks at a time before stopping once nasal congestion improves for maximum benefit 1 each 5   Vitamin D, Ergocalciferol, (DRISDOL) 1.25  MG (50000 UNIT) CAPS capsule TAKE 1 CAPSULE BY MOUTH ON TUEDAYS AND FRIDAYS 24 capsule 0   Social History   Socioeconomic History   Marital status: Married    Spouse name: Not on file   Number of children: Not on file   Years of education: Not on file   Highest education level: Not on file  Occupational History   Occupation: retired  Tobacco Use   Smoking status: Never   Smokeless tobacco: Never  Vaping Use   Vaping Use: Never used  Substance and Sexual Activity   Alcohol use: Not Currently    Comment: occasional   Drug use: No   Sexual activity: Yes  Other Topics Concern   Not on file  Social History Narrative   Not on file   Social Determinants of Health   Financial Resource Strain: Low Risk  (10/16/2022)   Overall Financial Resource  Strain (CARDIA)    Difficulty of Paying Living Expenses: Not hard at all  Food Insecurity: No Food Insecurity (10/16/2022)   Hunger Vital Sign    Worried About Running Out of Food in the Last Year: Never true    [place] in the Last Year: Never true  Transportation Needs: No Transportation Needs (10/16/2022)   PRAPARE - Hydrologist (Medical): No    Lack of Transportation (Non-Medical): No  Physical Activity: Insufficiently Active (10/16/2022)   Exercise Vital Sign    Days of Exercise per Week: 2 days    Minutes of Exercise per Session: 20 min  Stress: No Stress Concern Present (10/16/2022)   [place]    Feeling of Stress : Not at all  Social Connections: Not on file  Intimate Partner Violence: Not At Risk (09/10/2018)   Humiliation, Afraid, Rape, and Kick questionnaire    Fear of Current or Ex-Partner: No    Emotionally Abused: No    Physically Abused: No    Sexually Abused: No    Family History  Problem Relation Age of Onset   Diabetes Mother    Hypertension Mother    Diabetes Sister      ROS:  As stated in the HPI and negative for all other systems.  Physical Exam: Blood pressure (!) 193/83, pulse 65, temperature 97.9 F (36.6 C), temperature source Oral, resp. rate 18, height 5\' 2"  (1.575 m), weight 76.2 kg, SpO2 97 %.  GENERAL:  Well appearing HEENT:  Pupils equal round and reactive, fundi not visualized, oral mucosa unremarkable NECK:  No jugular venous distention, waveform within normal limits, carotid upstroke brisk and symmetric , positive bilateral bruits, no thyromegaly LYMPHATICS:  No cervical, inguinal adenopathy LUNGS:  Clear to auscultation bilaterally BACK:  No CVA tenderness CHEST:  Unremarkable HEART:  PMI not displaced or sustained,S1 and S2 within normal limits, no S3, no S4, no clicks, no rubs, no murmurs ABD:  Flat, positive bowel sounds normal in  frequency in pitch, no bruits, no rebound, no guarding, no midline pulsatile mass, no hepatomegaly, no splenomegaly EXT:  2 plus pulses throughout, no edema, no cyanosis no clubbing SKIN:  No rashes no nodules NEURO:  Cranial nerves II through XII grossly intact, motor grossly intact throughout PSYCH:  Cognitively intact, oriented to person place and time   Labs: Lab Results  Component Value Date   BUN 10 01/24/2023   Lab Results  Component Value Date   CREATININE 0.87 01/24/2023   Lab Results  Component Value Date   NA 139 01/24/2023   K 3.3 (L) 01/24/2023   CL 104 01/24/2023   CO2 25 01/24/2023   No results found for: "TROPONINI" Lab Results  Component Value Date   WBC 5.0 01/24/2023   HGB 14.3 01/24/2023   HCT 45.3 01/24/2023   MCV 85.3 01/24/2023   PLT 349 01/24/2023   Lab Results  Component Value Date   CHOL 200 (H) 05/21/2022   HDL 86 05/21/2022   LDLCALC 101 (H) 05/21/2022   TRIG 71 05/21/2022   CHOLHDL 2.3 05/21/2022   Lab Results  Component Value Date   ALT 14 10/16/2022   AST 17 10/16/2022   ALKPHOS 86 10/16/2022   BILITOT 0.4 10/16/2022      Radiology:  CXR:  NAD  EKG:  NSR, rate 65, axis WNL, intervals with long QT.  No acute ST T wave changes    ASSESSMENT AND PLAN:    Left arm pain:  Anginal and non anginal features.  I think she needs to be observed and have a coronary work up.  I would suggest CT angiogram which we might not be able to arrange until Monday.  If she has any recurrent symptoms she would need heparin.    HTN:   She has not had her daily meds yet and is getting these.  We can see how she does on these and make adjustments as needed.  She does not take her BP at home and should start recording this.    Prolonged QT:    Supplement potassium and repeat EKG in the AM.   Signed: Minus Breeding 01/24/2023, 6:07 PM

## 2023-01-24 NOTE — ED Notes (Signed)
ED TO INPATIENT HANDOFF REPORT  ED Nurse Name and Phone #: Duanne Guess RN [phone]  S Name/Age/Gender Kathryn Mendez 72 y.o. female Room/Bed: 004C/004C  Code Status   Code Status: Full Code  Home/SNF/Other Home Patient oriented to: self, place, time, and situation Is this baseline? Yes   Triage Complete: Triage complete  Chief Complaint Chest pain [R07.9]  Triage Note Pt BIB EMS from doctor office (lives at home) for intermittent chest pain for 1 week that radiates to left arm, back, face & headache at "top of forehead". Pt reports improved pain with rest. MD office gave 324 ASA and one dose of nitro which improved pain. Pt presents with speech impairment. Per EMS, pt and gurney fell over, pt landed on left arm- mild abrasion. No head trauma or LOC.   EMS VS EKG NSR 230/100 68 HR 98% RA 97.69F   Allergies Allergies  Allergen Reactions   Other     Food allergies    Level of Care/Admitting Diagnosis ED Disposition     ED Disposition  Admit   Condition  --   Comment  Hospital Area: [facility] [[id number]]  Level of Care: Telemetry Cardiac [[id number]]  May place patient in observation at [facility] or [facility] if equivalent level of care is available:: No  Covid Evaluation: Asymptomatic - no recent exposure (last 10 days) testing not required  Diagnosis: Chest pain [id number]  Admitting Physician: Minus Breeding [[id number]]  Attending Physician: Minus Breeding [id number]          B Medical/Surgery History Past Medical History:  Diagnosis Date   High cholesterol    Hypertension    Past Surgical History:  Procedure Laterality Date   CHOLECYSTECTOMY     TUBAL LIGATION       A IV Location/Drains/Wounds Patient Lines/Drains/Airways Status     Active Line/Drains/Airways     Name Placement date Placement time Site Days   Peripheral IV 01/24/23 20 G Right Antecubital 01/24/23  1321  Antecubital  less than 1            Intake/Output  Last 24 hours No intake or output data in the 24 hours ending 01/24/23 1836  Labs/Imaging Results for orders placed or performed during the hospital encounter of 01/24/23 (from the past 48 hour(s))  Basic metabolic panel     Status: Abnormal   Collection Time: 01/24/23  1:27 PM  Result Value Ref Range   Sodium 139 135 - 145 mmol/L   Potassium 3.3 (L) 3.5 - 5.1 mmol/L   Chloride 104 98 - 111 mmol/L   CO2 25 22 - 32 mmol/L   Glucose, Bld 122 (H) 70 - 99 mg/dL    Comment: Glucose reference range applies only to samples taken after fasting for at least 8 hours.   BUN 10 8 - 23 mg/dL   Creatinine, Ser 0.87 0.44 - 1.00 mg/dL   Calcium 9.1 8.9 - 10.3 mg/dL   GFR, Estimated >60 >60 mL/min    Comment: (NOTE) Calculated using the CKD-EPI Creatinine Equation (2021)    Anion gap 10 5 - 15    Comment: Performed at [facility] [address], [place] [address]  CBC     Status: Abnormal   Collection Time: 01/24/23  1:27 PM  Result Value Ref Range   WBC 5.0 4.0 - 10.5 K/uL   RBC 5.31 (H) 3.87 - 5.11 MIL/uL   Hemoglobin 14.3 12.0 - 15.0 g/dL   HCT 45.3  36.0 - 46.0 %   MCV 85.3 80.0 - 100.0 fL   MCH 26.9 26.0 - 34.0 pg   MCHC 31.6 30.0 - 36.0 g/dL   RDW 13.0 11.5 - 15.5 %   Platelets 349 150 - 400 K/uL   nRBC 0.0 0.0 - 0.2 %    Comment: Performed at [facility], [place] [address], [place], [place] [address]  Troponin I (High Sensitivity)     Status: None   Collection Time: 01/24/23  1:27 PM  Result Value Ref Range   Troponin I (High Sensitivity) 6 <18 ng/L    Comment: (NOTE) Elevated high sensitivity troponin I (hsTnI) values and significant  changes across serial measurements may suggest ACS but many other  chronic and acute conditions are known to elevate hsTnI results.  Refer to the "Links" section for chest pain algorithms and additional  guidance. Performed at [facility], [place] [address] [place], [place] [address]   Troponin I (High Sensitivity)      Status: None   Collection Time: 01/24/23  4:11 PM  Result Value Ref Range   Troponin I (High Sensitivity) 6 <18 ng/L    Comment: (NOTE) Elevated high sensitivity troponin I (hsTnI) values and significant  changes across serial measurements may suggest ACS but many other  chronic and acute conditions are known to elevate hsTnI results.  Refer to the "Links" section for chest pain algorithms and additional  guidance. Performed at [facility], [place] [address] [place], [place] [address]    CT Head Wo Contrast  Result Date: 01/24/2023 CLINICAL DATA:  Headache, neuro deficit EXAM: CT HEAD WITHOUT CONTRAST TECHNIQUE: Contiguous axial images were obtained from the base of the skull through the vertex without intravenous contrast. RADIATION DOSE REDUCTION: This exam was performed according to the departmental dose-optimization program which includes automated exposure control, adjustment of the mA and/or kV according to patient size and/or use of iterative reconstruction technique. COMPARISON:  None Available. FINDINGS: Brain: No evidence of acute infarction, hemorrhage, hydrocephalus, extra-axial collection or mass lesion/mass effect. Vascular: No hyperdense vessel. Skull: No acute fracture. Sinuses/Orbits: Clear sinuses.  No acute orbital findings. Other: No mastoid effusions. IMPRESSION: No evidence of acute intracranial abnormality. Electronically Signed   By: Margaretha Sheffield M.D.   On: 01/24/2023 14:58   DG Chest Port 1 View  Result Date: 01/24/2023 CLINICAL DATA:  Chest pain EXAM: PORTABLE CHEST 1 VIEW COMPARISON:  None Available. FINDINGS: The heart size and mediastinal contours are within normal limits. Both lungs are clear. The visualized skeletal structures are unremarkable. IMPRESSION: No active disease. Electronically Signed   By: Marin Roberts M.D.   On: 01/24/2023 13:38    Pending Labs Unresulted Labs (From admission, onward)     Start     Ordered   01/25/23 0500  Lipoprotein  A (LPA)  Tomorrow morning,   R        01/24/23 1810   01/25/23 0500  Lipid panel  Tomorrow morning,   R        01/24/23 1810   01/25/23 [id number]  Basic metabolic panel  Tomorrow morning,   R        01/24/23 1810   01/25/23 0500  CBC  Tomorrow morning,   R        01/24/23 1810   01/24/23 1817  Magnesium  Once,   R        01/24/23 1816   01/24/23 1811  Hemoglobin A1c  Once,  R        01/24/23 1810   01/24/23 1811  TSH  Once,   R        01/24/23 1810   01/24/23 1810  CBC  (heparin)  Once,   R       Comments: Baseline for heparin therapy IF NOT ALREADY DRAWN.  Notify MD if PLT < 100 K.    01/24/23 1810   01/24/23 1810  Creatinine, serum  (heparin)  Once,   R       Comments: Baseline for heparin therapy IF NOT ALREADY DRAWN.    01/24/23 1810            Vitals/Pain Today's Vitals   01/24/23 1530 01/24/23 1600 01/24/23 1630 01/24/23 1730  BP: (!) 194/75 (!) 193/83 (!) 192/73 (!) 178/74  Pulse: 67 65 65 66  Resp: 13 18 17 18   Temp:      TempSrc:      SpO2: 98% 97% 99% 98%  Weight:      Height:      PainSc:        Isolation Precautions No active isolations  Medications Medications  chlorhexidine (PERIDEX) 0.12 % solution (  Not Given 01/24/23 1346)  aspirin EC tablet 81 mg (has no administration in time range)  nitroGLYCERIN (NITROSTAT) SL tablet 0.4 mg (has no administration in time range)  acetaminophen (TYLENOL) tablet 650 mg (has no administration in time range)  ondansetron (ZOFRAN) injection 4 mg (has no administration in time range)  heparin injection 5,000 Units (has no administration in time range)  metoprolol succinate (TOPROL-XL) 24 hr tablet 100 mg (has no administration in time range)  potassium chloride SA (KLOR-CON M) CR tablet 40 mEq (has no administration in time range)    Mobility walks     Focused Assessments Cardiac Assessment Handoff:  Cardiac Rhythm: Normal sinus rhythm No results found for: "CKTOTAL", "CKMB", "CKMBINDEX", "TROPONINI" No  results found for: "DDIMER" Does the Patient currently have chest pain? No    R Recommendations: See Admitting Provider Note  Report given to:   Additional Notes:

## 2023-01-24 NOTE — ED Provider Notes (Signed)
[facility] Provider Note   CSN: [id number] Arrival date & time: 01/24/23  1257     History {Add pertinent medical, surgical, social history, OB history to HPI:1} Chief Complaint  Patient presents with   Chest Pain    Intermittent for 1 week radiated to back, left arm & face    Kathryn Mendez is a 72 y.o. female with past medical history hypertension, hyperlipidemia, prediabetes who presents to the ED with her daughter complaining of chest pain.  Patient reports that she has had intermittent chest pain for the last 2 to 3 days.  She states that pain is to the mid-sternum as well as the left side of the chest and radiates to her left shoulder and down her left arm.  She states that she has been doing various things when the pain happens but it is typically better resting her typical household activities and she has not noticed worsening pain with exertion.  She denies associated shortness of breath, nausea, vomiting, lightheadedness, dizziness, syncope, diaphoresis, abdominal pain or back pain.  She does state that her blood pressure has been poorly controlled over the last week and has been up to the [id number] systolic at home despite taking her medications.  She complains of associated pain to the top of her head.  No vision changes or focal weakness.  Patient states that she has noticed some difficulty with her speech over the last 1 to 2 days.  Daughter confirms this and states that patient does not typically have issues with her speech and she has noticed this new issue since yesterday.  No history of CVA or TIA.  Patient is not on any anticoagulants.  Patient seen at urgent care prior to ED visit today where she was given full dose of aspirin and nitroglycerin.  She reports that following these medications she had relief of her pain and is currently chest pain-free.  Patient states that she was previously admitted to the hospital for chest pain  workup but this has been many years ago and at that time she had no signs of heart disease or other cardiac abnormalities.  She is not currently followed by an outpatient cardiologist.    Home Medications Prior to Admission medications   Medication Sig Start Date End Date Taking? Authorizing Provider  albuterol (VENTOLIN HFA) 108 (90 Base) MCG/ACT inhaler Inhale 2 puffs into the lungs every 4 (four) hours as needed for wheezing or shortness of breath. 10/16/22   Glendale Chard, MD  allopurinol (ZYLOPRIM) 100 MG tablet TAKE 1 TABLET(100 MG) BY MOUTH DAILY 08/06/22   Glendale Chard, MD  amLODipine (NORVASC) 5 MG tablet Take 1 tablet (5 mg total) by mouth daily. 05/22/22   Minette Brine, FNP  Azelastine HCl 0.15 % SOLN 2 sprays per nostril 1-2 times daily as needed for nasal drainage/throat clearing 10/12/21   Kennith Gain, MD  Cholecalciferol (VITAMIN D) 125 MCG (5000 UT) CAPS Take 1 capsule by mouth daily.    [provider]  hydrochlorothiazide (HYDRODIURIL) 25 MG tablet Take 1 tablet (25 mg total) by mouth daily. Patient not taking: Reported on 10/16/2022 05/22/22   Minette Brine, FNP  levocetirizine (XYZAL) 5 MG tablet Take 1 tablet (5 mg total) by mouth every evening. 04/16/22   Kennith Gain, MD  metoprolol succinate (TOPROL-XL) 100 MG 24 hr tablet Take with or immediately following a meal. 11/07/22   Glendale Chard, MD  Olopatadine HCl 0.2 % SOLN 1  drop per eye daily as needed for itchy/watery eyes 10/12/21   Kennith Gain, MD  pravastatin (PRAVACHOL) 40 MG tablet TAKE 1 TABLET(40 MG) BY MOUTH EVERY EVENING 06/14/22   Glendale Chard, MD  triamcinolone (NASACORT) 55 MCG/ACT AERO nasal inhaler 2 sprays per nostril daily for 1-2 weeks at a time before stopping once nasal congestion improves for maximum benefit 10/12/21   Kennith Gain, MD  Vitamin D, Ergocalciferol, (DRISDOL) 1.25 MG (50000 UNIT) CAPS capsule TAKE 1 CAPSULE BY MOUTH ON TUEDAYS  AND FRIDAYS 06/12/21   Glendale Chard, MD      Allergies    Other    Review of Systems   Review of Systems  All other systems reviewed and are negative.   Physical Exam Updated Vital Signs BP (!) 178/74   Pulse 66   Temp 97.9 F (36.6 C) (Oral)   Resp 18   Ht 5\' 2"  (1.575 m)   Wt 76.2 kg   SpO2 98%   BMI 30.73 kg/m  Physical Exam Vitals and nursing note reviewed.  Constitutional:      General: She is not in acute distress.    Appearance: Normal appearance. She is not ill-appearing, toxic-appearing or diaphoretic.  HENT:     Head: Normocephalic and atraumatic.     Mouth/Throat:     Mouth: Mucous membranes are moist.  Eyes:     Conjunctiva/sclera: Conjunctivae normal.  Neck:     Vascular: No JVD.  Cardiovascular:     Rate and Rhythm: Normal rate and regular rhythm.     Heart sounds: Normal heart sounds. No murmur heard.    No S3 or S4 sounds.  Pulmonary:     Effort: Pulmonary effort is normal. No tachypnea, accessory muscle usage or respiratory distress.     Breath sounds: Normal breath sounds. No stridor. No decreased breath sounds, wheezing, rhonchi or rales.  Chest:     Chest wall: No mass, deformity, tenderness, crepitus or edema.  Abdominal:     General: Abdomen is flat.     Palpations: Abdomen is soft. There is no hepatomegaly, splenomegaly or mass.     Tenderness: There is no abdominal tenderness. There is no guarding or rebound.  Musculoskeletal:        General: Normal range of motion.     Cervical back: Normal range of motion and neck supple.     Right lower leg: No tenderness. No edema.     Left lower leg: No tenderness. No edema.  Skin:    General: Skin is warm and dry.     Capillary Refill: Capillary refill takes less than 2 seconds.  Neurological:     Mental Status: She is alert and oriented to person, place, and time.     GCS: GCS eye subscore is 4. GCS verbal subscore is 5. GCS motor subscore is 6.     Motor: Motor function is intact. No  weakness, tremor, atrophy, abnormal muscle tone, seizure activity or pronator drift.     Coordination: Coordination is intact.     Comments: Patient has mildly stuttering speech but it is not slurred and is comprehensible, otherwise cranial nerves and remainder of neurological exam intact     ED Results / Procedures / Treatments   Labs (all labs ordered are listed, but only abnormal results are displayed) Labs Reviewed  BASIC METABOLIC PANEL - Abnormal; Notable for the following components:      Result Value   Potassium 3.3 (*)    Glucose,  Bld 122 (*)    All other components within normal limits  CBC - Abnormal; Notable for the following components:   RBC 5.31 (*)    All other components within normal limits  LIPOPROTEIN A (LPA)  CBC  CREATININE, SERUM  LIPID PANEL  BASIC METABOLIC PANEL  CBC  HEMOGLOBIN A1C  TSH  TROPONIN I (HIGH SENSITIVITY)  TROPONIN I (HIGH SENSITIVITY)    EKG EKG Interpretation  Date/Time:  Friday January 24 2023 13:08:01 EDT Ventricular Rate:  65 PR Interval:  154 QRS Duration: 96 QT Interval:  470 QTC Calculation: 489 R Axis:   -8 Text Interpretation: Sinus rhythm Probable left atrial enlargement Abnormal R-wave progression, early transition Left ventricular hypertrophy Borderline prolonged QT interval No significant change since prior 6/09 Confirmed by Aletta Edouard [phone]) on 01/24/2023 1:09:46 PM  Radiology CT Head Wo Contrast  Result Date: 01/24/2023 CLINICAL DATA:  Headache, neuro deficit EXAM: CT HEAD WITHOUT CONTRAST TECHNIQUE: Contiguous axial images were obtained from the base of the skull through the vertex without intravenous contrast. RADIATION DOSE REDUCTION: This exam was performed according to the departmental dose-optimization program which includes automated exposure control, adjustment of the mA and/or kV according to patient size and/or use of iterative reconstruction technique. COMPARISON:  None Available. FINDINGS: Brain: No  evidence of acute infarction, hemorrhage, hydrocephalus, extra-axial collection or mass lesion/mass effect. Vascular: No hyperdense vessel. Skull: No acute fracture. Sinuses/Orbits: Clear sinuses.  No acute orbital findings. Other: No mastoid effusions. IMPRESSION: No evidence of acute intracranial abnormality. Electronically Signed   By: Margaretha Sheffield M.D.   On: 01/24/2023 14:58   DG Chest Port 1 View  Result Date: 01/24/2023 CLINICAL DATA:  Chest pain EXAM: PORTABLE CHEST 1 VIEW COMPARISON:  None Available. FINDINGS: The heart size and mediastinal contours are within normal limits. Both lungs are clear. The visualized skeletal structures are unremarkable. IMPRESSION: No active disease. Electronically Signed   By: Marin Roberts M.D.   On: 01/24/2023 13:38    Procedures Procedures  {Document cardiac monitor, telemetry assessment procedure when appropriate:1}  Medications Ordered in ED Medications  chlorhexidine (PERIDEX) 0.12 % solution (  Not Given 01/24/23 1346)  aspirin EC tablet 81 mg (has no administration in time range)  nitroGLYCERIN (NITROSTAT) SL tablet 0.4 mg (has no administration in time range)  acetaminophen (TYLENOL) tablet 650 mg (has no administration in time range)  ondansetron (ZOFRAN) injection 4 mg (has no administration in time range)  heparin injection 5,000 Units (has no administration in time range)  metoprolol succinate (TOPROL-XL) 24 hr tablet 100 mg (has no administration in time range)    ED Course/ Medical Decision Making/ A&P   {   Click here for ABCD2, HEART and other calculatorsREFRESH Note before signing :1}                          Medical Decision Making Amount and/or Complexity of Data Reviewed Labs: ordered. Decision-making details documented in ED Course. Radiology: ordered. Decision-making details documented in ED Course. ECG/medicine tests: ordered. Decision-making details documented in ED Course.  Risk Prescription drug  management. Decision regarding hospitalization.   Medical Decision Making:   MARGEAUX MASSIE is a 72 y.o. female who presented to the ED today with chest pain detailed above.    Additional history discussed with patient's family/caregivers.  Patient's presentation is complicated by their history of hyperlipidemia, hypertension, prediabetes.  Patient placed on continuous vitals and telemetry monitoring while in ED which  was reviewed periodically.  Complete initial physical exam performed, notably the patient  was in no acute distress.  Normal heart and lung exam.  Patient did have slight stuttering speech but was otherwise neurologically intact. Reviewed and confirmed nursing documentation for past medical history, family history, social history.    Initial Assessment:   With the patient's presentation of ***, most likely diagnosis is ***. Differential diagnosis includes but is not limited to {crccopa:27899}  Initial Plan:  Screening labs including CBC and Metabolic panel to evaluate for infectious or metabolic etiology of disease.  Urinalysis with reflex culture ordered to evaluate for UTI or relevant urologic/nephrologic pathology.  CXR to evaluate for structural/infectious intrathoracic pathology.  EKG to evaluate for cardiac pathology Objective evaluation as reviewed   Initial Study Results:   Laboratory  All laboratory results reviewed without evidence of clinically relevant pathology.   Exceptions include: ***   EKG EKG was reviewed independently. ST segments without concerns for elevations.   EKG: {ekg findings:315101}.   Radiology:  All images reviewed independently. Agree with radiology report at this time.   CT Head Wo Contrast  Result Date: 01/24/2023 CLINICAL DATA:  Headache, neuro deficit EXAM: CT HEAD WITHOUT CONTRAST TECHNIQUE: Contiguous axial images were obtained from the base of the skull through the vertex without intravenous contrast. RADIATION DOSE  REDUCTION: This exam was performed according to the departmental dose-optimization program which includes automated exposure control, adjustment of the mA and/or kV according to patient size and/or use of iterative reconstruction technique. COMPARISON:  None Available. FINDINGS: Brain: No evidence of acute infarction, hemorrhage, hydrocephalus, extra-axial collection or mass lesion/mass effect. Vascular: No hyperdense vessel. Skull: No acute fracture. Sinuses/Orbits: Clear sinuses.  No acute orbital findings. Other: No mastoid effusions. IMPRESSION: No evidence of acute intracranial abnormality. Electronically Signed   By: Margaretha Sheffield M.D.   On: 01/24/2023 14:58   DG Chest Port 1 View  Result Date: 01/24/2023 CLINICAL DATA:  Chest pain EXAM: PORTABLE CHEST 1 VIEW COMPARISON:  None Available. FINDINGS: The heart size and mediastinal contours are within normal limits. Both lungs are clear. The visualized skeletal structures are unremarkable. IMPRESSION: No active disease. Electronically Signed   By: Marin Roberts M.D.   On: 01/24/2023 13:38      Consults: Case discussed with cardiology, Dr. Percival Spanish, who will admit patient.   Final Assessment and Plan:   ***    Clinical Impression:  1. Unstable angina (HCC)      Admit    {Document critical care time when appropriate:1} {Document review of labs and clinical decision tools ie heart score, Chads2Vasc2 etc:1}  {Document your independent review of radiology images, and any outside records:1} {Document your discussion with family members, caretakers, and with consultants:1} {Document social determinants of health affecting pt's care:1} {Document your decision making why or why not admission, treatments were needed:1} Final Clinical Impression(s) / ED Diagnoses Final diagnoses:  Unstable angina ([place])    Rx / DC Orders ED Discharge Orders     None

## 2023-01-25 ENCOUNTER — Observation Stay (HOSPITAL_BASED_OUTPATIENT_CLINIC_OR_DEPARTMENT_OTHER): Payer: Medicare HMO

## 2023-01-25 DIAGNOSIS — R0789 Other chest pain: Secondary | ICD-10-CM

## 2023-01-25 DIAGNOSIS — R079 Chest pain, unspecified: Secondary | ICD-10-CM | POA: Diagnosis not present

## 2023-01-25 LAB — BASIC METABOLIC PANEL
Anion gap: 9 (ref 5–15)
BUN: 11 mg/dL (ref 8–23)
CO2: 26 mmol/L (ref 22–32)
Calcium: 8.9 mg/dL (ref 8.9–10.3)
Chloride: 103 mmol/L (ref 98–111)
Creatinine, Ser: 0.98 mg/dL (ref 0.44–1.00)
GFR, Estimated: >60 mL/min (ref >60)
Glucose, Bld: 171 mg/dL — ABNORMAL HIGH (ref 70–99)
Potassium: 3.3 mmol/L — ABNORMAL LOW (ref 3.5–5.1)
Sodium: 138 mmol/L (ref 135–145)

## 2023-01-25 LAB — ECHOCARDIOGRAM COMPLETE
AR max vel: 1.98 cm2
AV Area VTI: 1.99 cm2
AV Area mean vel: 2.02 cm2
AV Mean grad: 4 mmHg
AV Peak grad: 7.6 mmHg
Ao pk vel: 1.38 m/s
Area-P 1/2: 3.42 cm2
Height: 62 in
S' Lateral: 2.8 cm
Weight: 2624 oz

## 2023-01-25 LAB — CBC
HCT: 40.4 % (ref 36.0–46.0)
Hemoglobin: 13.4 g/dL (ref 12.0–15.0)
MCH: 27.6 pg (ref 26.0–34.0)
MCHC: 33.2 g/dL (ref 30.0–36.0)
MCV: 83.1 fL (ref 80.0–100.0)
Platelets: 322 10*3/uL (ref 150–400)
RBC: 4.86 MIL/uL (ref 3.87–5.11)
RDW: 12.9 % (ref 11.5–15.5)
WBC: 6.6 10*3/uL (ref 4.0–10.5)
nRBC: 0 % (ref 0.0–0.2)

## 2023-01-25 LAB — LIPID PANEL
Cholesterol: 197 mg/dL (ref 0–200)
HDL: 83 mg/dL (ref >40)
LDL Cholesterol: 101 mg/dL — ABNORMAL HIGH (ref 0–99)
Total CHOL/HDL Ratio: 2.4 RATIO
Triglycerides: 63 mg/dL (ref <150)
VLDL: 13 mg/dL (ref 0–40)

## 2023-01-25 MED ORDER — HYDRALAZINE HCL 20 MG/ML IJ SOLN: 25.0000 mg | Freq: Once | INTRAMUSCULAR | Status: DC

## 2023-01-25 MED ORDER — HYDRALAZINE HCL 20 MG/ML IJ SOLN
10.0000 mg | Freq: Once | INTRAMUSCULAR | Status: AC
  Administered 2023-01-25: 10 mg via INTRAVENOUS
  Filled 2023-01-25: qty 1

## 2023-01-25 NOTE — Progress Notes (Signed)
Patient complained of left arm/shoulder pain. Rating somewhat low after more rest. Offered PRN nitro but patient states this gives her a headache. Offered tylenol in addition to help. Decided to only do tylenol and hold off on nitro. Educated patient on [place].

## 2023-01-25 NOTE — Care Management Obs Status (Signed)
[facility] NOTIFICATION   Patient Details  Name: ANETTE DIKE MRN: [id number] Date of Birth: 1951/07/15   Medicare Observation Status Notification Given:  Yes    Tom-Johnson, Renea Ee, RN 01/25/2023, 4:21 PM

## 2023-01-25 NOTE — Progress Notes (Signed)
Echocardiogram 2D Echocardiogram has been performed.  Kathryn Mendez 01/25/2023, 10:52 AM

## 2023-01-25 NOTE — Progress Notes (Signed)
Rounding Note    Patient Name: Kathryn Mendez Date of Encounter: 01/25/2023  [facility] Cardiologist: New  Subjective   Recurrent episode of chest pain around 3AM    Inpatient Medications    Scheduled Meds:  allopurinol  100 mg Oral Daily   amLODipine  5 mg Oral Daily   aspirin EC  81 mg Oral Daily   heparin  5,000 Units Subcutaneous Q8H   metoprolol succinate  100 mg Oral Daily   pravastatin  40 mg Oral q1800   Continuous Infusions:  PRN Meds: acetaminophen, albuterol, nitroGLYCERIN, ondansetron (ZOFRAN) IV   Vital Signs    Vitals:   01/24/23 1839 01/24/23 1922 01/24/23 2327 01/25/23 0345  BP: (!) 184/84 (!) 195/93 (!) 181/75 (!) 146/68  Pulse: 80 80 65 72  Resp:  18 16 18   Temp:  98.7 F (37.1 C) 98.1 F (36.7 C) 98 F (36.7 C)  TempSrc:  Oral Oral Oral  SpO2:  98% 97% 98%  Weight:  74.4 kg  74.4 kg  Height:       No intake or output data in the 24 hours ending 01/25/23 0805    01/25/2023    3:45 AM 01/24/2023    7:22 PM 01/24/2023    1:10 PM  Last 3 Weights  Weight (lbs) 164 lb 164 lb 167 lb 15.9 oz  Weight (kg) 74.39 kg 74.39 kg 76.2 kg      Telemetry    NSR - Personally Reviewed  ECG    N/a - Personally Reviewed  Physical Exam   GEN: No acute distress.   Neck: No JVD Cardiac: RRR, no murmurs, rubs, or gallops.  Respiratory: Clear to auscultation bilaterally. GI: Soft, nontender, non-distended  MS: No edema; No deformity. Neuro:  Nonfocal  Psych: Normal affect   Labs    High Sensitivity Troponin:   Recent Labs  Lab 01/24/23 1327 01/24/23 1611  TROPONINIHS 6 6     Chemistry Recent Labs  Lab 01/24/23 1327 01/24/23 1934 01/25/23 0219  NA 139  --  138  K 3.3*  --  3.3*  CL 104  --  103  CO2 25  --  26  GLUCOSE 122*  --  171*  BUN 10  --  11  CREATININE 0.87 0.78 0.98  CALCIUM 9.1  --  8.9  MG  --  1.8  --   GFRNONAA >60 >60 >60  ANIONGAP 10  --  9    Lipids  Recent Labs  Lab 01/25/23 0219  CHOL  197  TRIG 63  HDL 83  LDLCALC 101*  CHOLHDL 2.4    Hematology Recent Labs  Lab 01/24/23 1327 01/24/23 1934 01/25/23 0219  WBC 5.0 5.9 6.6  RBC 5.31* 5.52* 4.86  HGB 14.3 15.5* 13.4  HCT 45.3 45.6 40.4  MCV 85.3 82.6 83.1  MCH 26.9 28.1 27.6  MCHC 31.6 34.0 33.2  RDW 13.0 12.8 12.9  PLT 349 350 322   Thyroid  Recent Labs  Lab 01/24/23 1934  TSH 2.164    BNPNo results for input(s): "BNP", "PROBNP" in the last 168 hours.  DDimer No results for input(s): "DDIMER" in the last 168 hours.   Radiology    CT Head Wo Contrast  Result Date: 01/24/2023 CLINICAL DATA:  Headache, neuro deficit EXAM: CT HEAD WITHOUT CONTRAST TECHNIQUE: Contiguous axial images were obtained from the base of the skull through the vertex without intravenous contrast. RADIATION DOSE REDUCTION: This exam was performed according to  the departmental dose-optimization program which includes automated exposure control, adjustment of the mA and/or kV according to patient size and/or use of iterative reconstruction technique. COMPARISON:  None Available. FINDINGS: Brain: No evidence of acute infarction, hemorrhage, hydrocephalus, extra-axial collection or mass lesion/mass effect. Vascular: No hyperdense vessel. Skull: No acute fracture. Sinuses/Orbits: Clear sinuses.  No acute orbital findings. Other: No mastoid effusions. IMPRESSION: No evidence of acute intracranial abnormality. Electronically Signed   By: Margaretha Sheffield M.D.   On: 01/24/2023 14:58   DG Chest Port 1 View  Result Date: 01/24/2023 CLINICAL DATA:  Chest pain EXAM: PORTABLE CHEST 1 VIEW COMPARISON:  None Available. FINDINGS: The heart size and mediastinal contours are within normal limits. Both lungs are clear. The visualized skeletal structures are unremarkable. IMPRESSION: No active disease. Electronically Signed   By: Marin Roberts M.D.   On: 01/24/2023 13:38    Cardiac Studies     Patient Profile     72 y.o. female no prior cardiac history  admitted with left arm pain  Assessment & Plan    1.Left arm pain/Chest pain - mixed symptoms for possible angina -trops neg x 2, EKG SR without acute ischemic changes - echo pending today - coronary CTA pending per Dr Percival Spanish, scheduled for Monday - if no recurrent pain over the weekend and benign echo may consider outpatient study vs keeping until Monday. Had some food this AM, would not be able to get lexiscan.   For questions or updates, please contact [facility] Please consult [email or web address] for contact info under        Signed, Carlyle Dolly, MD  01/25/2023, 8:05 AM

## 2023-01-26 ENCOUNTER — Encounter (HOSPITAL_COMMUNITY): Payer: Self-pay | Admitting: Cardiology

## 2023-01-26 DIAGNOSIS — M79602 Pain in left arm: Secondary | ICD-10-CM | POA: Diagnosis present

## 2023-01-26 DIAGNOSIS — E876 Hypokalemia: Secondary | ICD-10-CM | POA: Diagnosis present

## 2023-01-26 DIAGNOSIS — R0789 Other chest pain: Secondary | ICD-10-CM | POA: Diagnosis not present

## 2023-01-26 DIAGNOSIS — R9431 Abnormal electrocardiogram [ECG] [EKG]: Secondary | ICD-10-CM | POA: Diagnosis present

## 2023-01-26 DIAGNOSIS — R002 Palpitations: Secondary | ICD-10-CM | POA: Diagnosis present

## 2023-01-26 DIAGNOSIS — I200 Unstable angina: Secondary | ICD-10-CM | POA: Diagnosis present

## 2023-01-26 DIAGNOSIS — Z8249 Family history of ischemic heart disease and other diseases of the circulatory system: Secondary | ICD-10-CM | POA: Diagnosis not present

## 2023-01-26 DIAGNOSIS — R079 Chest pain, unspecified: Secondary | ICD-10-CM | POA: Diagnosis present

## 2023-01-26 DIAGNOSIS — I700 Atherosclerosis of aorta: Secondary | ICD-10-CM | POA: Diagnosis not present

## 2023-01-26 DIAGNOSIS — R7303 Prediabetes: Secondary | ICD-10-CM | POA: Diagnosis present

## 2023-01-26 DIAGNOSIS — I2089 Other forms of angina pectoris: Secondary | ICD-10-CM | POA: Diagnosis present

## 2023-01-26 DIAGNOSIS — E782 Mixed hyperlipidemia: Secondary | ICD-10-CM | POA: Diagnosis present

## 2023-01-26 DIAGNOSIS — Z833 Family history of diabetes mellitus: Secondary | ICD-10-CM | POA: Diagnosis not present

## 2023-01-26 DIAGNOSIS — I10 Essential (primary) hypertension: Secondary | ICD-10-CM | POA: Diagnosis present

## 2023-01-26 DIAGNOSIS — Z79899 Other long term (current) drug therapy: Secondary | ICD-10-CM | POA: Diagnosis not present

## 2023-01-26 LAB — LIPOPROTEIN A (LPA): Lipoprotein (a): 85.9 nmol/L — ABNORMAL HIGH (ref <75.0)

## 2023-01-26 MED ORDER — AMLODIPINE BESYLATE 10 MG PO TABS
10.0000 mg | ORAL_TABLET | Freq: Every day | ORAL | Status: DC
  Administered 2023-01-26 – 2023-01-27 (×2): 10 mg via ORAL
  Filled 2023-01-26 (×2): qty 1

## 2023-01-26 MED ORDER — POTASSIUM CHLORIDE CRYS ER 20 MEQ PO TBCR
40.0000 meq | EXTENDED_RELEASE_TABLET | Freq: Once | ORAL | Status: AC
  Administered 2023-01-26: 40 meq via ORAL
  Filled 2023-01-26: qty 2

## 2023-01-26 NOTE — Progress Notes (Signed)
Rounding Note    Patient Name: Kathryn Mendez Date of Encounter: 01/26/2023  [place] Cardiologist: New  Subjective   Some chest pain yesterday AM, no recurrence  Inpatient Medications    Scheduled Meds:  allopurinol  100 mg Oral Daily   amLODipine  5 mg Oral Daily   aspirin EC  81 mg Oral Daily   heparin  5,000 Units Subcutaneous Q8H   metoprolol succinate  100 mg Oral Daily   pravastatin  40 mg Oral q1800   Continuous Infusions:  PRN Meds: acetaminophen, albuterol, nitroGLYCERIN, ondansetron (ZOFRAN) IV   Vital Signs    Vitals:   01/25/23 1800 01/25/23 1925 01/26/23 0520 01/26/23 0828  BP: (!) 172/88 (!) 161/72 (!) 155/88 (!) 174/67  Pulse:   68 77  Resp:  18 18 18   Temp:  98.1 F (36.7 C) 97.8 F (36.6 C) 97.9 F (36.6 C)  TempSrc:  Oral Oral Oral  SpO2:  98% 97% 98%  Weight:   74 kg   Height:       No intake or output data in the 24 hours ending 01/26/23 0841    01/26/2023    5:20 AM 01/25/2023    3:45 AM 01/24/2023    7:22 PM  Last 3 Weights  Weight (lbs) 163 lb 3.2 oz 164 lb 164 lb  Weight (kg) 74.027 kg 74.39 kg 74.39 kg      Telemetry    NSR - Personally Reviewed  ECG    N/a - Personally Reviewed  Physical Exam   GEN: No acute distress.   Neck: No JVD Cardiac: RRR, no murmurs, rubs, or gallops.  Respiratory: Clear to auscultation bilaterally. GI: Soft, nontender, non-distended  MS: No edema; No deformity. Neuro:  Nonfocal  Psych: Normal affect   Labs    High Sensitivity Troponin:   Recent Labs  Lab 01/24/23 1327 01/24/23 1611  TROPONINIHS 6 6     Chemistry Recent Labs  Lab 01/24/23 1327 01/24/23 1934 01/25/23 0219  NA 139  --  138  K 3.3*  --  3.3*  CL 104  --  103  CO2 25  --  26  GLUCOSE 122*  --  171*  BUN 10  --  11  CREATININE 0.87 0.78 0.98  CALCIUM 9.1  --  8.9  MG  --  1.8  --   GFRNONAA >60 >60 >60  ANIONGAP 10  --  9    Lipids  Recent Labs  Lab 01/25/23 0219  CHOL 197  TRIG  63  HDL 83  LDLCALC 101*  CHOLHDL 2.4    Hematology Recent Labs  Lab 01/24/23 1327 01/24/23 1934 01/25/23 0219  WBC 5.0 5.9 6.6  RBC 5.31* 5.52* 4.86  HGB 14.3 15.5* 13.4  HCT 45.3 45.6 40.4  MCV 85.3 82.6 83.1  MCH 26.9 28.1 27.6  MCHC 31.6 34.0 33.2  RDW 13.0 12.8 12.9  PLT 349 350 322   Thyroid  Recent Labs  Lab 01/24/23 1934  TSH 2.164    BNPNo results for input(s): "BNP", "PROBNP" in the last 168 hours.  DDimer No results for input(s): "DDIMER" in the last 168 hours.   Radiology    ECHOCARDIOGRAM COMPLETE  Result Date: 01/25/2023    ECHOCARDIOGRAM REPORT   Patient Name:   CAROYL BYLER Date of Exam: 01/25/2023 Medical Rec #:  [id number]             Height:       62.0  in Accession #:    [id number]            Weight:       164.0 lb Date of Birth:  [birth year]              BSA:          1.757 m Patient Age:    72 years              BP:           146/68 mmHg Patient Gender: F                     HR:           61 bpm. Exam Location:  Inpatient Procedure: 2D Echo, Cardiac Doppler and Color Doppler Indications:    Chest Pain R07.9  History:        Patient has no prior history of Echocardiogram examinations.                 Signs/Symptoms:Chest Pain; Risk Factors:Hypertension and                 Dyslipidemia.  Sonographer:    Ronny Flurry Referring Phys: [id number] Margie Billet IMPRESSIONS  1. Left ventricular ejection fraction, by estimation, is 60 to 65%. The left ventricle has normal function. The left ventricle has no regional wall motion abnormalities. Left ventricular diastolic parameters are consistent with Grade I diastolic dysfunction (impaired relaxation).  2. Right ventricular systolic function is normal. The right ventricular size is normal. There is normal pulmonary artery systolic pressure. The estimated right ventricular systolic pressure is 99991111 mmHg.  3. The mitral valve is normal in structure. Trivial mitral valve regurgitation. No evidence of mitral stenosis.  4.  The aortic valve is tricuspid. Aortic valve regurgitation is not visualized. Aortic valve sclerosis is present, with no evidence of aortic valve stenosis.  5. The inferior vena cava is normal in size with greater than 50% respiratory variability, suggesting right atrial pressure of 3 mmHg. Comparison(s): No prior Echocardiogram. FINDINGS  Left Ventricle: Left ventricular ejection fraction, by estimation, is 60 to 65%. The left ventricle has normal function. The left ventricle has no regional wall motion abnormalities. The left ventricular internal cavity size was normal in size. There is  no left ventricular hypertrophy. Left ventricular diastolic parameters are consistent with Grade I diastolic dysfunction (impaired relaxation). Right Ventricle: The right ventricular size is normal. No increase in right ventricular wall thickness. Right ventricular systolic function is normal. There is normal pulmonary artery systolic pressure. The tricuspid regurgitant velocity is 2.65 m/s, and  with an assumed right atrial pressure of 3 mmHg, the estimated right ventricular systolic pressure is 99991111 mmHg. Left Atrium: Left atrial size was normal in size. Right Atrium: Right atrial size was normal in size. Pericardium: There is no evidence of pericardial effusion. Mitral Valve: The mitral valve is normal in structure. There is mild thickening of the mitral valve leaflet(s). Trivial mitral valve regurgitation. No evidence of mitral valve stenosis. Tricuspid Valve: The tricuspid valve is normal in structure. Tricuspid valve regurgitation is trivial. Aortic Valve: The aortic valve is tricuspid. Aortic valve regurgitation is not visualized. Aortic valve sclerosis is present, with no evidence of aortic valve stenosis. Aortic valve mean gradient measures 4.0 mmHg. Aortic valve peak gradient measures 7.6  mmHg. Aortic valve area, by VTI measures 1.99 cm. Pulmonic Valve: The pulmonic valve was normal in structure. Pulmonic valve  regurgitation is trivial. Aorta:  The aortic root and ascending aorta are structurally normal, with no evidence of dilitation. Venous: The inferior vena cava is normal in size with greater than 50% respiratory variability, suggesting right atrial pressure of 3 mmHg. IAS/Shunts: The interatrial septum is aneurysmal. The atrial septum is grossly normal.  LEFT VENTRICLE PLAX 2D LVIDd:         4.10 cm   Diastology LVIDs:         2.80 cm   LV e' medial:    5.98 cm/s LV PW:         1.00 cm   LV E/e' medial:  8.7 LV IVS:        1.00 cm   LV e' lateral:   6.85 cm/s LVOT diam:     2.00 cm   LV E/e' lateral: 7.6 LV SV:         62 LV SV Index:   35 LVOT Area:     3.14 cm  RIGHT VENTRICLE            IVC RV S prime:     8.38 cm/s  IVC diam: 1.60 cm TAPSE (M-mode): 1.8 cm LEFT ATRIUM             Index        RIGHT ATRIUM           Index LA diam:        4.00 cm 2.28 cm/m   RA Area:     13.00 cm LA Vol (A2C):   47.7 ml 27.15 ml/m  RA Volume:   29.00 ml  16.50 ml/m LA Vol (A4C):   47.3 ml 26.92 ml/m LA Biplane Vol: 51.8 ml 29.48 ml/m  AORTIC VALVE AV Area (Vmax):    1.98 cm AV Area (Vmean):   2.02 cm AV Area (VTI):     1.99 cm AV Vmax:           138.00 cm/s AV Vmean:          89.600 cm/s AV VTI:            0.311 m AV Peak Grad:      7.6 mmHg AV Mean Grad:      4.0 mmHg LVOT Vmax:         86.80 cm/s LVOT Vmean:        57.567 cm/s LVOT VTI:          0.197 m LVOT/AV VTI ratio: 0.63  AORTA Ao Root diam: 3.00 cm Ao Asc diam:  3.20 cm MITRAL VALVE               TRICUSPID VALVE MV Area (PHT): 3.42 cm    TR Peak grad:   28.1 mmHg MV Decel Time: 222 msec    TR Vmax:        265.00 cm/s MV E velocity: 52.30 cm/s MV A velocity: 70.10 cm/s  SHUNTS MV E/A ratio:  0.75        Systemic VTI:  0.20 m                            Systemic Diam: 2.00 cm Gwyndolyn Kaufman MD Electronically signed by Gwyndolyn Kaufman MD Signature Date/Time: 01/25/2023/11:25:40 AM    Final    CT Head Wo Contrast  Result Date: 01/24/2023 CLINICAL DATA:   Headache, neuro deficit EXAM: CT HEAD WITHOUT CONTRAST TECHNIQUE: Contiguous axial images were obtained from the base of the skull through the  vertex without intravenous contrast. RADIATION DOSE REDUCTION: This exam was performed according to the departmental dose-optimization program which includes automated exposure control, adjustment of the mA and/or kV according to patient size and/or use of iterative reconstruction technique. COMPARISON:  None Available. FINDINGS: Brain: No evidence of acute infarction, hemorrhage, hydrocephalus, extra-axial collection or mass lesion/mass effect. Vascular: No hyperdense vessel. Skull: No acute fracture. Sinuses/Orbits: Clear sinuses.  No acute orbital findings. Other: No mastoid effusions. IMPRESSION: No evidence of acute intracranial abnormality. Electronically Signed   By: Margaretha Sheffield M.D.   On: 01/24/2023 14:58   DG Chest Port 1 View  Result Date: 01/24/2023 CLINICAL DATA:  Chest pain EXAM: PORTABLE CHEST 1 VIEW COMPARISON:  None Available. FINDINGS: The heart size and mediastinal contours are within normal limits. Both lungs are clear. The visualized skeletal structures are unremarkable. IMPRESSION: No active disease. Electronically Signed   By: Marin Roberts M.D.   On: 01/24/2023 13:38    Cardiac Studies    Patient Profile     72 y.o. female no prior cardiac history admitted with left arm and chest pain pain   Assessment & Plan    1.Left arm pain/Chest pain - mixed symptoms for possible angina -trops neg x 2, EKG SR without acute ischemic changes - echo LVEF 60-65%, no WMAs, grade I dd - coronary CTA pending per Dr Percival Spanish, scheduled for Monday. Not available over the weekend, Had breakfast yesterday, did not pursue lexiscan.   2. HTN - running high, increase norvasc to 10mg  daily   For questions or updates, please contact [place] Please consult [email or web address] for contact info under        Signed, Carlyle Dolly, MD   01/26/2023, 8:41 AM

## 2023-01-27 ENCOUNTER — Inpatient Hospital Stay (HOSPITAL_COMMUNITY): Payer: Medicare HMO

## 2023-01-27 ENCOUNTER — Other Ambulatory Visit (HOSPITAL_COMMUNITY): Payer: Self-pay

## 2023-01-27 DIAGNOSIS — I200 Unstable angina: Secondary | ICD-10-CM | POA: Diagnosis not present

## 2023-01-27 DIAGNOSIS — I700 Atherosclerosis of aorta: Secondary | ICD-10-CM

## 2023-01-27 LAB — HEMOGLOBIN A1C
Hgb A1c MFr Bld: 5.7 % — ABNORMAL HIGH (ref 4.8–5.6)
Mean Plasma Glucose: 117 mg/dL

## 2023-01-27 MED ORDER — ROSUVASTATIN CALCIUM 20 MG PO TABS
20.0000 mg | ORAL_TABLET | Freq: Every day | ORAL | 3 refills | Status: DC
  Filled 2023-01-27 – 2023-06-04 (×2): qty 90, 90d supply, fill #0

## 2023-01-27 MED ORDER — IOHEXOL 350 MG/ML SOLN
100.0000 mL | Freq: Once | INTRAVENOUS | Status: AC | PRN
  Administered 2023-01-27: 100 mL via INTRAVENOUS

## 2023-01-27 MED ORDER — NITROGLYCERIN 0.4 MG SL SUBL
SUBLINGUAL_TABLET | SUBLINGUAL | Status: AC
  Administered 2023-01-27: 0.8 mg
  Filled 2023-01-27: qty 2

## 2023-01-27 MED ORDER — AMLODIPINE BESYLATE 10 MG PO TABS
10.0000 mg | ORAL_TABLET | Freq: Every day | ORAL | 3 refills | Status: DC
  Filled 2023-01-27 – 2023-04-24 (×2): qty 90, 90d supply, fill #0
  Filled 2023-07-23: qty 90, 90d supply, fill #1

## 2023-01-27 MED ORDER — NITROGLYCERIN 0.4 MG SL SUBL
0.4000 mg | SUBLINGUAL_TABLET | SUBLINGUAL | 2 refills | Status: AC | PRN
  Filled 2023-01-27: qty 25, 7d supply, fill #0

## 2023-01-27 MED ORDER — METOPROLOL TARTRATE 5 MG/5ML IV SOLN
INTRAVENOUS | Status: AC
  Administered 2023-01-27: 5 mg
  Filled 2023-01-27: qty 10

## 2023-01-27 NOTE — Progress Notes (Signed)
Rounding Note    Patient Name: Kathryn Mendez Date of Encounter: 01/27/2023  [place] Cardiologist: Minus Breeding, MD   Subjective   Pt found sitting up in the chair this morning. No further chest pain overnight.  Inpatient Medications    Scheduled Meds:  allopurinol  100 mg Oral Daily   amLODipine  10 mg Oral Daily   aspirin EC  81 mg Oral Daily   heparin  5,000 Units Subcutaneous Q8H   metoprolol succinate  100 mg Oral Daily   pravastatin  40 mg Oral q1800   Continuous Infusions:  PRN Meds: acetaminophen, albuterol, nitroGLYCERIN, ondansetron (ZOFRAN) IV   Vital Signs    Vitals:   01/26/23 0828 01/26/23 1032 01/26/23 2042 01/27/23 0545  BP: (!) 174/67 (!) 160/78 (!) 156/60 (!) 163/73  Pulse: 77 76    Resp: 18  17 15   Temp: 97.9 F (36.6 C)  98 F (36.7 C) 97.9 F (36.6 C)  TempSrc: Oral  Oral Oral  SpO2: 98%  100% 100%  Weight:    76.5 kg  Height:       No intake or output data in the 24 hours ending 01/27/23 0726    01/27/2023    5:45 AM 01/26/2023    5:20 AM 01/25/2023    3:45 AM  Last 3 Weights  Weight (lbs) 168 lb 9.6 oz 163 lb 3.2 oz 164 lb  Weight (kg) 76.476 kg 74.027 kg 74.39 kg      Telemetry    Sinus rhythm HR 80s - Personally Reviewed  ECG    No new tracings - Personally Reviewed  Physical Exam   GEN: No acute distress.   Neck: No JVD Cardiac: RRR, no murmurs, rubs, or gallops.  Respiratory: Clear to auscultation bilaterally. GI: Soft, nontender, non-distended  MS: No edema; No deformity. Neuro:  Nonfocal  Psych: Normal affect   Labs    High Sensitivity Troponin:   Recent Labs  Lab 01/24/23 1327 01/24/23 1611  TROPONINIHS 6 6     Chemistry Recent Labs  Lab 01/24/23 1327 01/24/23 1934 01/25/23 0219  NA 139  --  138  K 3.3*  --  3.3*  CL 104  --  103  CO2 25  --  26  GLUCOSE 122*  --  171*  BUN 10  --  11  CREATININE 0.87 0.78 0.98  CALCIUM 9.1  --  8.9  MG  --  1.8  --   GFRNONAA >60 >60  >60  ANIONGAP 10  --  9    Lipids  Recent Labs  Lab 01/25/23 0219  CHOL 197  TRIG 63  HDL 83  LDLCALC 101*  CHOLHDL 2.4    Hematology Recent Labs  Lab 01/24/23 1327 01/24/23 1934 01/25/23 0219  WBC 5.0 5.9 6.6  RBC 5.31* 5.52* 4.86  HGB 14.3 15.5* 13.4  HCT 45.3 45.6 40.4  MCV 85.3 82.6 83.1  MCH 26.9 28.1 27.6  MCHC 31.6 34.0 33.2  RDW 13.0 12.8 12.9  PLT 349 350 322   Thyroid  Recent Labs  Lab 01/24/23 1934  TSH 2.164    BNPNo results for input(s): "BNP", "PROBNP" in the last 168 hours.  DDimer No results for input(s): "DDIMER" in the last 168 hours.   Radiology    ECHOCARDIOGRAM COMPLETE  Result Date: 01/25/2023    ECHOCARDIOGRAM REPORT   Patient Name:   Kathryn Mendez Date of Exam: 01/25/2023 Medical Rec #:  [id number]  Height:       62.0 in Accession #:    [id number]            Weight:       164.0 lb Date of Birth:  09-09-1951              BSA:          1.757 m Patient Age:    72 years              BP:           146/68 mmHg Patient Gender: F                     HR:           61 bpm. Exam Location:  Inpatient Procedure: 2D Echo, Cardiac Doppler and Color Doppler Indications:    Chest Pain R07.9  History:        Patient has no prior history of Echocardiogram examinations.                 Signs/Symptoms:Chest Pain; Risk Factors:Hypertension and                 Dyslipidemia.  Sonographer:    Ronny Flurry Referring Phys: [id number] Margie Billet IMPRESSIONS  1. Left ventricular ejection fraction, by estimation, is 60 to 65%. The left ventricle has normal function. The left ventricle has no regional wall motion abnormalities. Left ventricular diastolic parameters are consistent with Grade I diastolic dysfunction (impaired relaxation).  2. Right ventricular systolic function is normal. The right ventricular size is normal. There is normal pulmonary artery systolic pressure. The estimated right ventricular systolic pressure is 99991111 mmHg.  3. The mitral valve is  normal in structure. Trivial mitral valve regurgitation. No evidence of mitral stenosis.  4. The aortic valve is tricuspid. Aortic valve regurgitation is not visualized. Aortic valve sclerosis is present, with no evidence of aortic valve stenosis.  5. The inferior vena cava is normal in size with greater than 50% respiratory variability, suggesting right atrial pressure of 3 mmHg. Comparison(s): No prior Echocardiogram. FINDINGS  Left Ventricle: Left ventricular ejection fraction, by estimation, is 60 to 65%. The left ventricle has normal function. The left ventricle has no regional wall motion abnormalities. The left ventricular internal cavity size was normal in size. There is  no left ventricular hypertrophy. Left ventricular diastolic parameters are consistent with Grade I diastolic dysfunction (impaired relaxation). Right Ventricle: The right ventricular size is normal. No increase in right ventricular wall thickness. Right ventricular systolic function is normal. There is normal pulmonary artery systolic pressure. The tricuspid regurgitant velocity is 2.65 m/s, and  with an assumed right atrial pressure of 3 mmHg, the estimated right ventricular systolic pressure is 99991111 mmHg. Left Atrium: Left atrial size was normal in size. Right Atrium: Right atrial size was normal in size. Pericardium: There is no evidence of pericardial effusion. Mitral Valve: The mitral valve is normal in structure. There is mild thickening of the mitral valve leaflet(s). Trivial mitral valve regurgitation. No evidence of mitral valve stenosis. Tricuspid Valve: The tricuspid valve is normal in structure. Tricuspid valve regurgitation is trivial. Aortic Valve: The aortic valve is tricuspid. Aortic valve regurgitation is not visualized. Aortic valve sclerosis is present, with no evidence of aortic valve stenosis. Aortic valve mean gradient measures 4.0 mmHg. Aortic valve peak gradient measures 7.6  mmHg. Aortic valve area, by VTI measures  1.99 cm. Pulmonic Valve: The pulmonic valve was normal  in structure. Pulmonic valve regurgitation is trivial. Aorta: The aortic root and ascending aorta are structurally normal, with no evidence of dilitation. Venous: The inferior vena cava is normal in size with greater than 50% respiratory variability, suggesting right atrial pressure of 3 mmHg. IAS/Shunts: The interatrial septum is aneurysmal. The atrial septum is grossly normal.  LEFT VENTRICLE PLAX 2D LVIDd:         4.10 cm   Diastology LVIDs:         2.80 cm   LV e' medial:    5.98 cm/s LV PW:         1.00 cm   LV E/e' medial:  8.7 LV IVS:        1.00 cm   LV e' lateral:   6.85 cm/s LVOT diam:     2.00 cm   LV E/e' lateral: 7.6 LV SV:         62 LV SV Index:   35 LVOT Area:     3.14 cm  RIGHT VENTRICLE            IVC RV S prime:     8.38 cm/s  IVC diam: 1.60 cm TAPSE (M-mode): 1.8 cm LEFT ATRIUM             Index        RIGHT ATRIUM           Index LA diam:        4.00 cm 2.28 cm/m   RA Area:     13.00 cm LA Vol (A2C):   47.7 ml 27.15 ml/m  RA Volume:   29.00 ml  16.50 ml/m LA Vol (A4C):   47.3 ml 26.92 ml/m LA Biplane Vol: 51.8 ml 29.48 ml/m  AORTIC VALVE AV Area (Vmax):    1.98 cm AV Area (Vmean):   2.02 cm AV Area (VTI):     1.99 cm AV Vmax:           138.00 cm/s AV Vmean:          89.600 cm/s AV VTI:            0.311 m AV Peak Grad:      7.6 mmHg AV Mean Grad:      4.0 mmHg LVOT Vmax:         86.80 cm/s LVOT Vmean:        57.567 cm/s LVOT VTI:          0.197 m LVOT/AV VTI ratio: 0.63  AORTA Ao Root diam: 3.00 cm Ao Asc diam:  3.20 cm MITRAL VALVE               TRICUSPID VALVE MV Area (PHT): 3.42 cm    TR Peak grad:   28.1 mmHg MV Decel Time: 222 msec    TR Vmax:        265.00 cm/s MV E velocity: 52.30 cm/s MV A velocity: 70.10 cm/s  SHUNTS MV E/A ratio:  0.75        Systemic VTI:  0.20 m                            Systemic Diam: 2.00 cm Gwyndolyn Kaufman MD Electronically signed by Gwyndolyn Kaufman MD Signature Date/Time: 01/25/2023/11:25:40 AM     Final     Cardiac Studies   Echo 01/25/23:  1. Left ventricular ejection fraction, by estimation, is 60 to 65%. The  left ventricle has normal  function. The left ventricle has no regional  wall motion abnormalities. Left ventricular diastolic parameters are  consistent with Grade I diastolic  dysfunction (impaired relaxation).   2. Right ventricular systolic function is normal. The right ventricular  size is normal. There is normal pulmonary artery systolic pressure. The  estimated right ventricular systolic pressure is 99991111 mmHg.   3. The mitral valve is normal in structure. Trivial mitral valve  regurgitation. No evidence of mitral stenosis.   4. The aortic valve is tricuspid. Aortic valve regurgitation is not  visualized. Aortic valve sclerosis is present, with no evidence of aortic  valve stenosis.   5. The inferior vena cava is normal in size with greater than 50%  respiratory variability, suggesting right atrial pressure of 3 mmHg.   Patient Profile     72 y.o. female with no prior cardiac history presented to ER with chest discomfort and left arm pain.   Assessment & Plan    Chest pain - hs troponin x 2 negative, EKG negative for ischemic changes - echocardiogram reassuring with preserved EF, no WMA, and no significant valvular disease - plan for CT coronary today - ASA, pravastatin   Hypertension - 10 mg amlodipine - increased from home 5 mg dose - pressure elevated this morning - give amlodipine early   Hyperlipidemia  - 40 mg pravastatin 01/25/2023: Cholesterol 197; HDL 83; LDL Cholesterol 101; Triglycerides 63; VLDL 13 - depending on CT coronary, may need to titrate statin    Likely discharge today if CT coronary without obstructive disease.   For questions or updates, please contact [place] Please consult [email or web address] for contact info under        Signed, Ledora Bottcher, PA  01/27/2023, 7:26 AM

## 2023-01-27 NOTE — Plan of Care (Signed)
  Problem: Education: Goal: Understanding of cardiac disease, CV risk reduction, and recovery process will improve Outcome: Adequate for Discharge Goal: Individualized Educational Video(s) Outcome: Adequate for Discharge   Problem: Activity: Goal: Ability to tolerate increased activity will improve Outcome: Adequate for Discharge   Problem: Cardiac: Goal: Ability to achieve and maintain adequate cardiovascular perfusion will improve Outcome: Adequate for Discharge   Problem: Health Behavior/Discharge Planning: Goal: Ability to safely manage health-related needs after discharge will improve Outcome: Adequate for Discharge   Problem: Education: Goal: Knowledge of General Education information will improve Description: Including pain rating scale, medication(s)/side effects and non-pharmacologic comfort measures Outcome: Adequate for Discharge   Problem: Health Behavior/Discharge Planning: Goal: Ability to manage health-related needs will improve Outcome: Adequate for Discharge   Problem: Clinical Measurements: Goal: Ability to maintain clinical measurements within normal limits will improve Outcome: Adequate for Discharge Goal: Will remain free from infection Outcome: Adequate for Discharge Goal: Diagnostic test results will improve Outcome: Adequate for Discharge Goal: Respiratory complications will improve Outcome: Adequate for Discharge Goal: Cardiovascular complication will be avoided Outcome: Adequate for Discharge   Problem: Activity: Goal: Risk for activity intolerance will decrease Outcome: Adequate for Discharge   Problem: Nutrition: Goal: Adequate nutrition will be maintained Outcome: Adequate for Discharge   Problem: Coping: Goal: Level of anxiety will decrease Outcome: Adequate for Discharge   Problem: Elimination: Goal: Will not experience complications related to bowel motility Outcome: Adequate for Discharge Goal: Will not experience complications  related to urinary retention Outcome: Adequate for Discharge   Problem: Pain Managment: Goal: General experience of comfort will improve Outcome: Adequate for Discharge   Problem: Safety: Goal: Ability to remain free from injury will improve Outcome: Adequate for Discharge   Problem: Skin Integrity: Goal: Risk for impaired skin integrity will decrease Outcome: Adequate for Discharge   

## 2023-01-27 NOTE — Discharge Summary (Signed)
Discharge Summary    Patient ID: Kathryn Mendez MRN: [id number]; DOB: 10-02-1951  Admit date: 01/24/2023 Discharge date: 01/27/2023  PCP:  [place]ale Chard, [place] Providers Cardiologist:  Minus Breeding, MD    Discharge Diagnoses    Principal Problem:   Chest pain Active Problems:   Essential hypertension   Mixed hyperlipidemia   Chest pain at rest   Diagnostic Studies/Procedures    CT coronary 01/27/23: IMPRESSION: 1. Coronary calcium score of 20.4. This was 59th percentile for age-, sex, and race-matched controls.   2. Normal coronary origin with right dominance.   3. There is minimal (<25%) calcified plaque in the LAD.  CAD-RADS 1.   4. There is no plaque in the proximal or distal RCA. Interpretation of the mid RCA is limited by motion artifact. However, there does not appear to be any obstructive coronary disease. Unable to rule out mild lesions.   5. Aortic atherosclerosis. _____________  Echo 01/25/23: 1. Left ventricular ejection fraction, by estimation, is 60 to 65%. The  left ventricle has normal function. The left ventricle has no regional  wall motion abnormalities. Left ventricular diastolic parameters are  consistent with Grade I diastolic  dysfunction (impaired relaxation).   2. Right ventricular systolic function is normal. The right ventricular  size is normal. There is normal pulmonary artery systolic pressure. The  estimated right ventricular systolic pressure is 99991111 mmHg.   3. The mitral valve is normal in structure. Trivial mitral valve  regurgitation. No evidence of mitral stenosis.   4. The aortic valve is tricuspid. Aortic valve regurgitation is not  visualized. Aortic valve sclerosis is present, with no evidence of aortic  valve stenosis.   5. The inferior vena cava is normal in size with greater than 50%  respiratory variability, suggesting right atrial pressure of 3 mmHg.     History of Present Illness      Kathryn Mendez is a 72 y.o. female with no past cardiac history other than a distant stress test. She has HTN and HLD.  She reports being active doing housework including vacuuming and pushing a mower.  She reports no symptoms until about two weeks ago.  She started having some left arm pain. This was a pressure from the shoulder to the elbow.  She might have had some vague chest discomfort and also has had some neck pain and a sensation in her head.  Today she took her BP when out shopping and noted it to be high which is why she came to the ED.  She said she has not had this kind of discomfort before.  It seems to come and go.  She might get it when she is out walking like at the store today.  There are no associated symptoms.  She seems to indicate that some of it might get worse if she tries to raise her left arm.  She wondered if she rested in bed on it is an odd way.  There has been no new shortness of breath, PND or orthopnea. There have been no reported presyncope or syncope.  She has occasional palpitations.   Hospital Course     Consultants: none  Chest pain She was admitted to cardiology service for further evaluation of her chest pain. She ruled out with negative troponin and nonischemic EKG. Echo was reassuring with preserved EF and no WMA. CT coronary was performed and showed nonobstructive disease, coronary calcium score 20.4 (59th percentile) Will hold  off on starting ASA, per Dr. Audie Box. Work on risk factor modification.   Hypertension Amlodipine increased to 10 mg.    Hyperlipidemia with LDL goal < 100 01/25/2023: Cholesterol 197; HDL 83; LDL Cholesterol 101; Triglycerides 63; VLDL 13 Pravastatin 40 mg at home Given coronary calcifications, will switch to 20 mg crestor Repeat lipid panel in 6 weeks   Prolonged QT Hypokalemia corrected. Repeat EKG with QTC 462 ms Avoid QT prolonging agents   Pt seen and examined by Dr. Audie Box and felt stable for discharge. Follow  up has been arranged.    Did the patient have an acute coronary syndrome (MI, NSTEMI, STEMI, etc) this admission?:  No                               Did the patient have a percutaneous coronary intervention (stent / angioplasty)?:  No.        The patient will be scheduled for a TOC follow up appointment in 7-14 days.  A message has been sent to the [facility] and Scheduling Pool at the office where the patient should be seen for follow up.  _____________  Discharge Vitals Blood pressure (!) 159/75, pulse 77, temperature (!) 97.5 F (36.4 C), temperature source Oral, resp. rate 16, height 5\' 2"  (1.575 m), weight 76.5 kg, SpO2 100 %.  Filed Weights   01/25/23 0345 01/26/23 0520 01/27/23 0545  Weight: 74.4 kg 74 kg 76.5 kg    Labs & Radiologic Studies    CBC Recent Labs    01/24/23 1934 01/25/23 0219  WBC 5.9 6.6  HGB 15.5* 13.4  HCT 45.6 40.4  MCV 82.6 83.1  PLT 350 [id number]   Basic Metabolic Panel Recent Labs    01/24/23 1934 01/25/23 0219  NA  --  138  K  --  3.3*  CL  --  103  CO2  --  26  GLUCOSE  --  171*  BUN  --  11  CREATININE 0.78 0.98  CALCIUM  --  8.9  MG 1.8  --    Liver Function Tests No results for input(s): "AST", "ALT", "ALKPHOS", "BILITOT", "PROT", "ALBUMIN" in the last 72 hours. No results for input(s): "LIPASE", "AMYLASE" in the last 72 hours. High Sensitivity Troponin:   Recent Labs  Lab 01/24/23 1327 01/24/23 1611  TROPONINIHS 6 6    BNP Invalid input(s): "POCBNP" D-Dimer No results for input(s): "DDIMER" in the last 72 hours. Hemoglobin A1C Recent Labs    01/24/23 1934  HGBA1C 5.7*   Fasting Lipid Panel Recent Labs    01/25/23 0219  CHOL 197  HDL 83  LDLCALC 101*  TRIG 63  CHOLHDL 2.4   Thyroid Function Tests Recent Labs    01/24/23 1934  TSH 2.164   _____________  CT CORONARY MORPH W/CTA COR W/SCORE W/CA W/CM &/OR WO/CM  Result Date: 01/27/2023 CLINICAL DATA:  59F with chest pain. EXAM: Cardiac/Coronary  CT TECHNIQUE:  The patient was scanned on a [employer]. FINDINGS: A 120 kV prospective scan was triggered in the descending thoracic aorta at 111 HU's. Axial non-contrast 3 mm slices were carried out through the heart. The data set was analyzed on a dedicated work station and scored using the [place]. Gantry rotation speed was 250 msecs and collimation was .6 mm. No beta blockade and 0.8 mg of sl NTG was given. The 3D data set was reconstructed in 5% intervals of the  67-82 % of the R-R cycle. Diastolic phases were analyzed on a dedicated work station using MPR, MIP and VRT modes. The patient received 80 cc of contrast. Aorta: Normal size.  Aortic atherosclerosis.  No dissection. Aortic Valve:  Trileaflet.  No calcifications. Coronary Arteries:  Normal coronary origin.  Right dominance. RCA is a large dominant artery that gives rise to PDA and PLVB. There is no plaque in the proximal or distal RCA. Interpretation of the mid RCA is limited by motion artifact. However, there does not appear to be any obstructive coronary disease. Unable to rule out mild lesions. Left main is a large artery that gives rise to LAD and LCX arteries. LAD is a large vessel that has minimal (<25%) calcified plaque in the proximal and mid vessel. LCX is a non-dominant artery that gives rise to OM1 and small OM2 and OM3 branches. There is no plaque. Coronary Calcium Score: Left main: 0 Left anterior descending artery: 20.4 Left circumflex artery: 0 Right coronary artery: 0 Total: 20.4 Percentile: 59th Other findings: Normal pulmonary vein drainage into the left atrium. Normal let atrial appendage without a thrombus. Normal size of the pulmonary artery. IMPRESSION: 1. Coronary calcium score of 20.4. This was 59th percentile for age-, sex, and race-matched controls. 2. Normal coronary origin with right dominance. 3. There is minimal (<25%) calcified plaque in the LAD.  CAD-RADS 1. 4. There is no plaque in the proximal or distal RCA.  Interpretation of the mid RCA is limited by motion artifact. However, there does not appear to be any obstructive coronary disease. Unable to rule out mild lesions. 5. Aortic atherosclerosis. RECOMMENDATIONS: 1. CAD-RADS 0: No evidence of CAD (0%). Consider non-atherosclerotic causes of chest pain. 2. CAD-RADS 1: Minimal non-obstructive CAD (0-24%). Consider non-atherosclerotic causes of chest pain. Consider preventive therapy and risk factor modification. 3. CAD-RADS 2: Mild non-obstructive CAD (25-49%). Consider non-atherosclerotic causes of chest pain. Consider preventive therapy and risk factor modification. 4. CAD-RADS 3: Moderate stenosis. Consider symptom-guided anti-ischemic pharmacotherapy as well as risk factor modification per guideline directed care. Additional analysis with CT FFR will be submitted. 5. CAD-RADS 4: Severe stenosis. (70-99% or > 50% left main). Cardiac catheterization or CT FFR is recommended. Consider symptom-guided anti-ischemic pharmacotherapy as well as risk factor modification per guideline directed care. Invasive coronary angiography recommended with revascularization per published guideline statements. 6. CAD-RADS 5: Total coronary occlusion (100%). Consider cardiac catheterization or viability assessment. Consider symptom-guided anti-ischemic pharmacotherapy as well as risk factor modification per guideline directed care. 7. CAD-RADS N: Non-diagnostic study. Obstructive CAD can't be excluded. Alternative evaluation is recommended. Skeet Latch, MD Electronically Signed   By: Skeet Latch M.D.   On: 01/27/2023 14:02   ECHOCARDIOGRAM COMPLETE  Result Date: 01/25/2023    ECHOCARDIOGRAM REPORT   Patient Name:   MIREYA WINT Date of Exam: 01/25/2023 Medical Rec #:  [id number]             Height:       62.0 in Accession #:    [id number]            Weight:       164.0 lb Date of Birth:  24-Aug-1951              BSA:          1.757 m Patient Age:    72 years               BP:  146/68 mmHg Patient Gender: F                     HR:           61 bpm. Exam Location:  Inpatient Procedure: 2D Echo, Cardiac Doppler and Color Doppler Indications:    Chest Pain R07.9  History:        Patient has no prior history of Echocardiogram examinations.                 Signs/Symptoms:Chest Pain; Risk Factors:Hypertension and                 Dyslipidemia.  Sonographer:    Ronny Flurry Referring Phys: [id number] Margie Billet IMPRESSIONS  1. Left ventricular ejection fraction, by estimation, is 60 to 65%. The left ventricle has normal function. The left ventricle has no regional wall motion abnormalities. Left ventricular diastolic parameters are consistent with Grade I diastolic dysfunction (impaired relaxation).  2. Right ventricular systolic function is normal. The right ventricular size is normal. There is normal pulmonary artery systolic pressure. The estimated right ventricular systolic pressure is 99991111 mmHg.  3. The mitral valve is normal in structure. Trivial mitral valve regurgitation. No evidence of mitral stenosis.  4. The aortic valve is tricuspid. Aortic valve regurgitation is not visualized. Aortic valve sclerosis is present, with no evidence of aortic valve stenosis.  5. The inferior vena cava is normal in size with greater than 50% respiratory variability, suggesting right atrial pressure of 3 mmHg. Comparison(s): No prior Echocardiogram. FINDINGS  Left Ventricle: Left ventricular ejection fraction, by estimation, is 60 to 65%. The left ventricle has normal function. The left ventricle has no regional wall motion abnormalities. The left ventricular internal cavity size was normal in size. There is  no left ventricular hypertrophy. Left ventricular diastolic parameters are consistent with Grade I diastolic dysfunction (impaired relaxation). Right Ventricle: The right ventricular size is normal. No increase in right ventricular wall thickness. Right ventricular systolic function is  normal. There is normal pulmonary artery systolic pressure. The tricuspid regurgitant velocity is 2.65 m/s, and  with an assumed right atrial pressure of 3 mmHg, the estimated right ventricular systolic pressure is 99991111 mmHg. Left Atrium: Left atrial size was normal in size. Right Atrium: Right atrial size was normal in size. Pericardium: There is no evidence of pericardial effusion. Mitral Valve: The mitral valve is normal in structure. There is mild thickening of the mitral valve leaflet(s). Trivial mitral valve regurgitation. No evidence of mitral valve stenosis. Tricuspid Valve: The tricuspid valve is normal in structure. Tricuspid valve regurgitation is trivial. Aortic Valve: The aortic valve is tricuspid. Aortic valve regurgitation is not visualized. Aortic valve sclerosis is present, with no evidence of aortic valve stenosis. Aortic valve mean gradient measures 4.0 mmHg. Aortic valve peak gradient measures 7.6  mmHg. Aortic valve area, by VTI measures 1.99 cm. Pulmonic Valve: The pulmonic valve was normal in structure. Pulmonic valve regurgitation is trivial. Aorta: The aortic root and ascending aorta are structurally normal, with no evidence of dilitation. Venous: The inferior vena cava is normal in size with greater than 50% respiratory variability, suggesting right atrial pressure of 3 mmHg. IAS/Shunts: The interatrial septum is aneurysmal. The atrial septum is grossly normal.  LEFT VENTRICLE PLAX 2D LVIDd:         4.10 cm   Diastology LVIDs:         2.80 cm   LV e' medial:    5.98 cm/s LV  PW:         1.00 cm   LV E/e' medial:  8.7 LV IVS:        1.00 cm   LV e' lateral:   6.85 cm/s LVOT diam:     2.00 cm   LV E/e' lateral: 7.6 LV SV:         62 LV SV Index:   35 LVOT Area:     3.14 cm  RIGHT VENTRICLE            IVC RV S prime:     8.38 cm/s  IVC diam: 1.60 cm TAPSE (M-mode): 1.8 cm LEFT ATRIUM             Index        RIGHT ATRIUM           Index LA diam:        4.00 cm 2.28 cm/m   RA Area:     13.00  cm LA Vol (A2C):   47.7 ml 27.15 ml/m  RA Volume:   29.00 ml  16.50 ml/m LA Vol (A4C):   47.3 ml 26.92 ml/m LA Biplane Vol: 51.8 ml 29.48 ml/m  AORTIC VALVE AV Area (Vmax):    1.98 cm AV Area (Vmean):   2.02 cm AV Area (VTI):     1.99 cm AV Vmax:           138.00 cm/s AV Vmean:          89.600 cm/s AV VTI:            0.311 m AV Peak Grad:      7.6 mmHg AV Mean Grad:      4.0 mmHg LVOT Vmax:         86.80 cm/s LVOT Vmean:        57.567 cm/s LVOT VTI:          0.197 m LVOT/AV VTI ratio: 0.63  AORTA Ao Root diam: 3.00 cm Ao Asc diam:  3.20 cm MITRAL VALVE               TRICUSPID VALVE MV Area (PHT): 3.42 cm    TR Peak grad:   28.1 mmHg MV Decel Time: 222 msec    TR Vmax:        265.00 cm/s MV E velocity: 52.30 cm/s MV A velocity: 70.10 cm/s  SHUNTS MV E/A ratio:  0.75        Systemic VTI:  0.20 m                            Systemic Diam: 2.00 cm Gwyndolyn Kaufman MD Electronically signed by Gwyndolyn Kaufman MD Signature Date/Time: 01/25/2023/11:25:40 AM    Final    CT Head Wo Contrast  Result Date: 01/24/2023 CLINICAL DATA:  Headache, neuro deficit EXAM: CT HEAD WITHOUT CONTRAST TECHNIQUE: Contiguous axial images were obtained from the base of the skull through the vertex without intravenous contrast. RADIATION DOSE REDUCTION: This exam was performed according to the departmental dose-optimization program which includes automated exposure control, adjustment of the mA and/or kV according to patient size and/or use of iterative reconstruction technique. COMPARISON:  None Available. FINDINGS: Brain: No evidence of acute infarction, hemorrhage, hydrocephalus, extra-axial collection or mass lesion/mass effect. Vascular: No hyperdense vessel. Skull: No acute fracture. Sinuses/Orbits: Clear sinuses.  No acute orbital findings. Other: No mastoid effusions. IMPRESSION: No evidence of acute intracranial abnormality. Electronically Signed   By:  Margaretha Sheffield M.D.   On: 01/24/2023 14:58   DG Chest Port 1  View  Result Date: 01/24/2023 CLINICAL DATA:  Chest pain EXAM: PORTABLE CHEST 1 VIEW COMPARISON:  None Available. FINDINGS: The heart size and mediastinal contours are within normal limits. Both lungs are clear. The visualized skeletal structures are unremarkable. IMPRESSION: No active disease. Electronically Signed   By: Marin Roberts M.D.   On: 01/24/2023 13:38   Disposition   Pt is being discharged home today in good condition.  Follow-up Plans & Appointments     Follow-up Information     Mayra Reel, NP Follow up on 02/24/2023.   Specialty: Cardiology Why: 11:20 am Contact information: [address] [place] [address] [phone]                Discharge Instructions     Diet - low sodium heart healthy   Complete by: As directed    Increase activity slowly   Complete by: As directed         Discharge Medications   Allergies as of 01/27/2023   No Known Allergies      Medication List     STOP taking these medications    hydrochlorothiazide 25 MG tablet Commonly known as: HYDRODIURIL   levocetirizine 5 MG tablet Commonly known as: XYZAL   metoprolol succinate 100 MG 24 hr tablet Commonly known as: TOPROL-XL   pravastatin 40 MG tablet Commonly known as: PRAVACHOL       TAKE these medications    albuterol 108 (90 Base) MCG/ACT inhaler Commonly known as: VENTOLIN HFA Inhale 2 puffs into the lungs every 4 (four) hours as needed for wheezing or shortness of breath.   allopurinol 100 MG tablet Commonly known as: ZYLOPRIM TAKE 1 TABLET(100 MG) BY MOUTH DAILY What changed: See the new instructions.   amLODipine 10 MG tablet Commonly known as: NORVASC Take 1 tablet (10 mg total) by mouth daily. What changed:  medication strength how much to take   ibuprofen 200 MG tablet Commonly known as: ADVIL Take 400 mg by mouth daily as needed for moderate pain or headache.   ketorolac 0.5 % ophthalmic solution Commonly known  as: ACULAR Place 1 drop into both eyes in the morning, at noon, and at bedtime.   nitroGLYCERIN 0.4 MG SL tablet Commonly known as: NITROSTAT Place 1 tablet (0.4 mg total) under the tongue every 5 (five) minutes x 3 doses as needed for chest pain.   ofloxacin 0.3 % ophthalmic solution Commonly known as: OCUFLOX Place 1 drop into both eyes 3 (three) times daily.   prednisoLONE acetate 1 % ophthalmic suspension Commonly known as: PRED FORTE Place 1 drop into both eyes 3 (three) times daily.   rosuvastatin 20 MG tablet Commonly known as: Crestor Take 1 tablet (20 mg total) by mouth daily.   Vitamin D (Ergocalciferol) 1.25 MG (50000 UNIT) Caps capsule Commonly known as: DRISDOL TAKE 1 CAPSULE BY MOUTH ON TUEDAYS AND FRIDAYS           Outstanding Labs/Studies   Noncardiac etiology of chest pain  Lipid panel in 6 weeks  Duration of Discharge Encounter   Greater than 30 minutes including physician time.  Signed, Ledora Bottcher, PA 01/27/2023, 2:37 PM

## 2023-01-27 NOTE — TOC Transition Note (Signed)
Transition of Care [facility]) - CM/SW Discharge Note   Patient Details  Name: MITA PETRAKIS MRN: [id number] Date of Birth: 08/10/1951  [facility]) CM/SW Contact:  Zenon Mayo, RN Phone Number: 01/27/2023, 3:31 PM   Clinical Narrative:     Patient is for dc today, has no needs.  Toc to fill meds.        Patient Goals and CMS Choice      Discharge Placement                         Discharge Plan and Services Additional resources added to the After Visit Summary for                                       Social Determinants of Health (SDOH) Interventions SDOH Screenings   Food Insecurity: No Food Insecurity (01/24/2023)  Housing: Low Risk  (01/24/2023)  Transportation Needs: No Transportation Needs (01/24/2023)  Utilities: Not At Risk (01/24/2023)  Depression (PHQ2-9): Low Risk  (10/16/2022)  Financial Resource Strain: Low Risk  (10/16/2022)  Physical Activity: Insufficiently Active (10/16/2022)  Stress: No Stress Concern Present (10/16/2022)  Tobacco Use: Low Risk  (01/26/2023)     Readmission Risk Interventions     No data to display

## 2023-01-27 NOTE — Progress Notes (Signed)
Discharge instructions (including medications) discussed with and copy provided to patient/caregiver 

## 2023-01-28 ENCOUNTER — Telehealth: Payer: Self-pay

## 2023-01-28 NOTE — Transitions of Care (Post Inpatient/ED Visit) (Signed)
   01/28/2023  Name: Kathryn Mendez MRN: [id number] DOB: December 08, 1950  Today's TOC FU Call Status: Today's TOC FU Call Status:: Unsuccessul Call (1st Attempt) Unsuccessful Call (1st Attempt) Date: 01/28/23  Attempted to reach the patient regarding the most recent Inpatient/ED visit.  Follow Up Plan: Additional outreach attempts will be made to reach the patient to complete the Transitions of Care (Post Inpatient/ED visit) call.     Enzo Montgomery, RN,BSN,CCM [facility] Care Management Care Management Community Coordinator Direct Phone: [phone] Toll Free: [phone] Fax: [phone]

## 2023-01-29 ENCOUNTER — Telehealth: Payer: Self-pay

## 2023-01-29 DIAGNOSIS — Z961 Presence of intraocular lens: Secondary | ICD-10-CM | POA: Diagnosis not present

## 2023-01-29 NOTE — Transitions of Care (Post Inpatient/ED Visit) (Signed)
   01/29/2023  Name: PRESLEIGH HELMIG MRN: [id number] DOB: 06-17-1951  Today's TOC FU Call Status: Today's TOC FU Call Status:: Unsuccessful Call (2nd Attempt) Unsuccessful Call (2nd Attempt) Date: 01/29/23  Attempted to reach the patient regarding the most recent Inpatient/ED visit.  Follow Up Plan: Additional outreach attempts will be made to reach the patient to complete the Transitions of Care (Post Inpatient/ED visit) call.     Enzo Montgomery, RN,BSN,CCM [facility] Care Management Care Management Community Coordinator Direct Phone: [phone] Toll Free: [phone] Fax: [phone]

## 2023-01-29 NOTE — Transitions of Care (Post Inpatient/ED Visit) (Signed)
   01/29/2023  Name: Kathryn Mendez MRN: [id number] DOB: 1951/03/15  Today's TOC FU Call Status: Today's TOC FU Call Status:: Successful TOC FU Call Competed TOC FU Call Complete Date: 01/29/23  Transition Care Management Follow-up Telephone Call Date of Discharge: 01/27/23 Discharge Facility: [facility]) Type of Discharge: Inpatient Admission Primary Inpatient Discharge Diagnosis:: "unstable angina" How have you been since you were released from the hospital?: Better (Patient states she has had no further pain/discomfort. She has been sleeping and eating well. She is monitoring BP 2x/day-BP ranging in the 140s, occasional 160s-in the mornings but prior to taking meds. She is adhering to low salt diet.) Any questions or concerns?: No  Items Reviewed: Did you receive and understand the discharge instructions provided?: Yes Medications obtained and verified?: Yes (Medications Reviewed) Any new allergies since your discharge?: No Dietary orders reviewed?: Yes Type of Diet Ordered:: low salt/heart healthy Do you have support at home?: Yes People in Home: alone  Home Care and Equipment/Supplies: [place] Ordered?: NA Any new equipment or medical supplies ordered?: NA  Functional Questionnaire: Do you need assistance with bathing/showering or dressing?: No Do you need assistance with meal preparation?: No Do you need assistance with eating?: No Do you have difficulty maintaining continence: No Do you need assistance with getting out of bed/getting out of a chair/moving?: No Do you have difficulty managing or taking your medications?: No  Follow up appointments reviewed: PCP Follow-up appointment confirmed?: Yes Date of PCP follow-up appointment?: 02/05/23 Follow-up Provider: Dr. Baird Cancer Specialist [facility] Follow-up appointment confirmed?: Yes Date of Specialist follow-up appointment?: 02/24/23 Follow-Up Specialty Provider:: Waynette Buttery Do you  need transportation to your follow-up appointment?: No Do you understand care options if your condition(s) worsen?: Yes-patient verbalized understanding   TOC Interventions Today    Flowsheet Row Most Recent Value  TOC Interventions   TOC Interventions Discussed/Reviewed TOC Interventions Discussed, Arranged PCP follow up less than 12 days/Care Guide scheduled      Interventions Today    Flowsheet Row Most Recent Value  General Interventions   General Interventions Discussed/Reviewed General Interventions Discussed, Doctor Visits  Doctor Visits Discussed/Reviewed Doctor Visits Discussed, PCP  PCP/Specialist Visits Compliance with follow-up visit  Education Interventions   Education Provided Provided Education  Provided Verbal Education On Nutrition, When to see the doctor, Medication, Other  [BP monitoring and mgmt]  Nutrition Interventions   Nutrition Discussed/Reviewed Nutrition Discussed, Adding fruits and vegetables, Decreasing salt  Pharmacy Interventions   Pharmacy Dicussed/Reviewed Pharmacy Topics Discussed, Medications and their functions       Hetty Blend [facility] Care Management Care Management Community Coordinator Direct Phone: [phone] Toll Free: [phone] Fax: [phone]

## 2023-02-05 ENCOUNTER — Encounter: Payer: Self-pay | Admitting: Internal Medicine

## 2023-02-05 ENCOUNTER — Other Ambulatory Visit: Payer: Self-pay | Admitting: Internal Medicine

## 2023-02-05 ENCOUNTER — Ambulatory Visit (INDEPENDENT_AMBULATORY_CARE_PROVIDER_SITE_OTHER): Payer: Medicare HMO | Admitting: Internal Medicine

## 2023-02-05 VITALS — BP 158/90 | HR 88 | Temp 97.8°F | Ht 62.0 in | Wt 165.2 lb

## 2023-02-05 DIAGNOSIS — R079 Chest pain, unspecified: Secondary | ICD-10-CM

## 2023-02-05 DIAGNOSIS — I10 Essential (primary) hypertension: Secondary | ICD-10-CM | POA: Diagnosis not present

## 2023-02-05 DIAGNOSIS — E7800 Pure hypercholesterolemia, unspecified: Secondary | ICD-10-CM

## 2023-02-05 DIAGNOSIS — Z6830 Body mass index (BMI) 30.0-30.9, adult: Secondary | ICD-10-CM

## 2023-02-05 DIAGNOSIS — R519 Headache, unspecified: Secondary | ICD-10-CM

## 2023-02-05 DIAGNOSIS — E6609 Other obesity due to excess calories: Secondary | ICD-10-CM | POA: Diagnosis not present

## 2023-02-05 DIAGNOSIS — E876 Hypokalemia: Secondary | ICD-10-CM

## 2023-02-05 MED ORDER — HYDRALAZINE HCL 10 MG PO TABS: ORAL_TABLET | ORAL | 0 refills | Status: DC

## 2023-02-05 NOTE — Progress Notes (Signed)
I,Victoria T Hamilton,acting as a scribe for Gwynneth Aliment, MD.,have documented all relevant documentation on the behalf of Gwynneth Aliment, MD,as directed by  Gwynneth Aliment, MD while in the presence of Gwynneth Aliment, MD.    Subjective:     Patient ID: Kathryn Mendez , female    DOB: 10/24/1951 , 72 y.o.   MRN: [id number]   Chief Complaint  Patient presents with   Hospitalization Follow-up    HPI  Pt presents today for hospital follow up. She was admitted on: 3/29/202r and discharged on 01/27/2023.  She states she was at home on 3/29 when she started to have left arm pain. Also with headache. She went to UC, bp was markedly elevated, she was then taken by EMS to [facility]. Unfortunately, while being transported from UC to the ambulance, she fell off of the stretcher. She states the EMS worker was more worried about the car that was scratched, than tending to her on the ground. Upon arrival to [facility], she mentioned a headache and had a head CT with no acute findings.  Enzymes were negative.  EKG demonstrates no acute changes. She was admitted to Cardiology. Workup included CCTA which was significant for minimal coronary calcium. Symptoms were non-cardiac. The dose of amlodipine was increased to 10mg  and she was transitioned to Crestor 20 mg daily at discharge. No need for aspirin. She will f/u with Cardiology as an outpatient.   Today patient reports feeling better than how she previously felt. She does admit to feeling soreness in her left arm.  She has not had any recurrent chest pain.       Past Medical History:  Diagnosis Date   High cholesterol    Hypertension      Family History  Problem Relation Age of Onset   Diabetes Mother    Hypertension Mother    Stroke Mother    Heart attack Father 7   Stroke Father    Diabetes Sister      Current Outpatient Medications:    albuterol (VENTOLIN HFA) 108 (90 Base) MCG/ACT inhaler, Inhale 2 puffs into the lungs every 4  (four) hours as needed for wheezing or shortness of breath., Disp: 18 g, Rfl: 3   amLODipine (NORVASC) 10 MG tablet, Take 1 tablet (10 mg total) by mouth daily., Disp: 90 tablet, Rfl: 3   ibuprofen (ADVIL) 200 MG tablet, Take 400 mg by mouth daily as needed for moderate pain or headache., Disp: , Rfl:    ketorolac (ACULAR) 0.5 % ophthalmic solution, Place 1 drop into both eyes in the morning, at noon, and at bedtime., Disp: , Rfl:    nitroGLYCERIN (NITROSTAT) 0.4 MG SL tablet, Place 1 tablet (0.4 mg total) under the tongue every 5 (five) minutes x 3 doses as needed for chest pain., Disp: 25 tablet, Rfl: 2   ofloxacin (OCUFLOX) 0.3 % ophthalmic solution, Place 1 drop into both eyes 3 (three) times daily., Disp: , Rfl:    prednisoLONE acetate (PRED FORTE) 1 % ophthalmic suspension, Place 1 drop into both eyes 3 (three) times daily., Disp: , Rfl:    rosuvastatin (CRESTOR) 20 MG tablet, Take 1 tablet (20 mg total) by mouth daily., Disp: 90 tablet, Rfl: 3   allopurinol (ZYLOPRIM) 100 MG tablet, TAKE 1 TABLET(100 MG) BY MOUTH DAILY, Disp: 90 tablet, Rfl: 2   hydrALAZINE (APRESOLINE) 10 MG tablet, TAKE 1 TABLET BY MOUTH TWICE DAILY, Disp: 180 tablet, Rfl: 1   Vitamin D, Ergocalciferol, (  DRISDOL) 1.25 MG (50000 UNIT) CAPS capsule, TAKE 1 CAPSULE BY MOUTH ON TUEDAYS AND FRIDAYS (Patient not taking: Reported on 01/24/2023), Disp: 24 capsule, Rfl: 0   Allergies  Allergen Reactions   Valsartan Anaphylaxis    angioedema     Review of Systems  Constitutional: Negative.   Respiratory: Negative.    Cardiovascular: Negative.   Gastrointestinal: Negative.   Musculoskeletal: Negative.   Neurological:  Positive for headaches.  Psychiatric/Behavioral: Negative.       Today's Vitals   02/05/23 1525  BP: (!) 158/90  Pulse: 88  Temp: 97.8 F (36.6 C)  SpO2: 98%  Weight: 165 lb 3.2 oz (74.9 kg)  Height: 5\' 2"  (1.575 m)   Body mass index is 30.22 kg/m.  Wt Readings from Last 3 Encounters:  02/14/23 165  lb (74.8 kg)  02/05/23 165 lb 3.2 oz (74.9 kg)  01/27/23 168 lb 9.6 oz (76.5 kg)    Objective:  Physical Exam Vitals and nursing note reviewed.  Constitutional:      Appearance: Normal appearance.  HENT:     Head: Normocephalic and atraumatic.     Nose:     Comments: Masked     Mouth/Throat:     Comments: Masked  Eyes:     Extraocular Movements: Extraocular movements intact.  Cardiovascular:     Rate and Rhythm: Normal rate and regular rhythm.     Heart sounds: Normal heart sounds.  Pulmonary:     Effort: Pulmonary effort is normal.     Breath sounds: Normal breath sounds.  Musculoskeletal:     Cervical back: Normal range of motion.  Skin:    General: Skin is warm.  Neurological:     General: No focal deficit present.     Mental Status: She is alert.  Psychiatric:        Mood and Affect: Mood normal.        Behavior: Behavior normal.         Assessment And Plan:     1. Chest pain, unspecified type Comments: She is not having pain at this time. She is encouraged to keep Cardiology f/u appt on 02/24/23. TCM PERFORMED. A MEMBER OF THE CLINICAL TEAM SPOKE WITH THE PATIENT UPON DISCHARGE. DISCHARGE SUMMARY WAS REVIEWED IN FULL DETAIL DURING THE VISIT. MEDS RECONCILED AND COMPARED TO DISCHARGE MEDS. MEDICATION LIST WAS UPDATED AND REVIEWED WITH THE PATIENT. GREATER THAN 50% FACE TO FACE TIME WAS SPENT IN COUNSELING AND COORDINATION OF CARE. ALL QUESTIONS WERE ANSWERED TO THE SATISFACTION OF THE PATIENT.    2. Essential hypertension Comments: Chronic, uncontrolled. Importance of dietary/medication compliance was d/w patient.  She agrees to f/u in one week for nurse visit. She is also encouraged to decrease intake of processed meats including bacon, sausages and deli meats.   3. Nonintractable episodic headache, unspecified headache type Comments: Likely related to elevated BP. Encouraged to prescribed meds at set times daily. I also encouraged adequate hydration.  4. Pure  hypercholesterolemia Comments: Chronic, now on rosuvastatin 20mg  daily. She is encouraged to follow a heart healthy lifestyle.  5. Hypokalemia Comments: This was corrected while hospitalized. I will recheck this today. - BMP8+eGFR  6. Class 1 obesity due to excess calories with serious comorbidity and body mass index (BMI) of 30.0 to 30.9 in adult Comments: BMI is acceptable for her demographic, she is encouraged to gradually increase her daily activity.   Patient was given opportunity to ask questions. Patient verbalized understanding of the plan and was able  to repeat key elements of the plan. All questions were answered to their satisfaction.   I, Gwynneth Alimentobyn N Raeanne Deschler, MD, have reviewed all documentation for this visit. The documentation on 02/05/23 for the exam, diagnosis, procedures, and orders are all accurate and complete.   IF YOU HAVE BEEN REFERRED TO A SPECIALIST, IT MAY TAKE 1-2 WEEKS TO SCHEDULE/PROCESS THE REFERRAL. IF YOU HAVE NOT HEARD FROM US/SPECIALIST IN TWO WEEKS, PLEASE GIVE US A CALL AT [phone].   THE PATIENT IS ENCOURAGED TO PRACTICE SOCIAL DISTANCING DUE TO THE COVID-19 PANDEMIC.

## 2023-02-05 NOTE — Patient Instructions (Signed)
Chest Wall Pain Chest wall pain is pain in or around the bones and muscles of your chest. Sometimes, an injury causes this pain. Excessive coughing or overuse of arm and chest muscles may also cause chest wall pain. Sometimes, the cause may not be known. This pain may take several weeks or longer to get better. Follow these instructions at home: Managing pain, stiffness, and swelling  If directed, put ice on the painful area: Put ice in a plastic bag. Place a towel between your skin and the bag. Leave the ice on for 20 minutes, 2-3 times per day. Activity Rest as told by your health care provider. Avoid activities that cause pain. These include any activities that use your chest muscles or your abdominal and side muscles to lift heavy items. Ask your health care provider what activities are safe for you. General instructions  Take over-the-counter and prescription medicines only as told by your health care provider. Do not use any products that contain nicotine or tobacco, such as cigarettes, e-cigarettes, and chewing tobacco. These can delay healing after injury. If you need help quitting, ask your health care provider. Keep all follow-up visits as told by your health care provider. This is important. Contact a health care provider if: You have a fever. Your chest pain becomes worse. You have new symptoms. Get help right away if: You have nausea or vomiting. You feel sweaty or light-headed. You have a cough with mucus from your lungs (sputum) or you cough up blood. You develop shortness of breath. These symptoms may represent a serious problem that is an emergency. Do not wait to see if the symptoms will go away. Get medical help right away. Call your local emergency services (911 in the [place] Do not drive yourself to the hospital. Summary Chest wall pain is pain in or around the bones and muscles of your chest. Depending on the cause, it may be treated with ice, rest, medicines, and  avoiding activities that cause pain. Contact a health care provider if you have a fever, worsening chest pain, or new symptoms. Get help right away if you feel light-headed or you develop shortness of breath. These symptoms may be an emergency. This information is not intended to replace advice given to you by your health care provider. Make sure you discuss any questions you have with your health care provider. Document Revised: 12/26/2020 Document Reviewed: 12/29/2020 Elsevier Patient Education  2023 Elsevier Inc.  

## 2023-02-06 LAB — BMP8+EGFR
BUN/Creatinine Ratio: 17 (ref 12–28)
BUN: 14 mg/dL (ref 8–27)
CO2: 22 mmol/L (ref 20–29)
Calcium: 9.7 mg/dL (ref 8.7–10.3)
Chloride: 102 mmol/L (ref 96–106)
Creatinine, Ser: 0.84 mg/dL (ref 0.57–1.00)
Glucose: 90 mg/dL (ref 70–99)
Potassium: 3.9 mmol/L (ref 3.5–5.2)
Sodium: 141 mmol/L (ref 134–144)
eGFR: 74 mL/min/{1.73_m2} (ref >59)

## 2023-02-07 ENCOUNTER — Other Ambulatory Visit: Payer: Self-pay | Admitting: Internal Medicine

## 2023-02-10 DIAGNOSIS — Z961 Presence of intraocular lens: Secondary | ICD-10-CM | POA: Diagnosis not present

## 2023-02-14 ENCOUNTER — Ambulatory Visit: Payer: Medicare HMO

## 2023-02-14 VITALS — BP 128/70 | HR 78 | Temp 98.1°F | Ht 62.0 in | Wt 165.0 lb

## 2023-02-14 DIAGNOSIS — I10 Essential (primary) hypertension: Secondary | ICD-10-CM

## 2023-02-14 NOTE — Patient Instructions (Signed)
Hypertension, Adult ?Hypertension is another name for high blood pressure. High blood pressure forces your heart to work harder to pump blood. This can cause problems over time. ?There are two numbers in a blood pressure reading. There is a top number (systolic) over a bottom number (diastolic). It is best to have a blood pressure that is below 120/80. ?What are the causes? ?The cause of this condition is not known. Some other conditions can lead to high blood pressure. ?What increases the risk? ?Some lifestyle factors can make you more likely to develop high blood pressure: ?Smoking. ?Not getting enough exercise or physical activity. ?Being overweight. ?Having too much fat, sugar, calories, or salt (sodium) in your diet. ?Drinking too much alcohol. ?Other risk factors include: ?Having any of these conditions: ?Heart disease. ?Diabetes. ?High cholesterol. ?Kidney disease. ?Obstructive sleep apnea. ?Having a family history of high blood pressure and high cholesterol. ?Age. The risk increases with age. ?Stress. ?What are the signs or symptoms? ?High blood pressure may not cause symptoms. Very high blood pressure (hypertensive crisis) may cause: ?Headache. ?Fast or uneven heartbeats (palpitations). ?Shortness of breath. ?Nosebleed. ?Vomiting or feeling like you may vomit (nauseous). ?Changes in how you see. ?Very bad chest pain. ?Feeling dizzy. ?Seizures. ?How is this treated? ?This condition is treated by making healthy lifestyle changes, such as: ?Eating healthy foods. ?Exercising more. ?Drinking less alcohol. ?Your doctor may prescribe medicine if lifestyle changes do not help enough and if: ?Your top number is above 130. ?Your bottom number is above 80. ?Your personal target blood pressure may vary. ?Follow these instructions at home: ?Eating and drinking ? ?If told, follow the DASH eating plan. To follow this plan: ?Fill one half of your plate at each meal with fruits and vegetables. ?Fill one fourth of your plate  at each meal with whole grains. Whole grains include whole-wheat pasta, brown rice, and whole-grain bread. ?Eat or drink low-fat dairy products, such as skim milk or low-fat yogurt. ?Fill one fourth of your plate at each meal with low-fat (lean) proteins. Low-fat proteins include fish, chicken without skin, eggs, beans, and tofu. ?Avoid fatty meat, cured and processed meat, or chicken with skin. ?Avoid pre-made or processed food. ?Limit the amount of salt in your diet to less than 1,500 mg each day. ?Do not drink alcohol if: ?Your doctor tells you not to drink. ?You are pregnant, may be pregnant, or are planning to become pregnant. ?If you drink alcohol: ?Limit how much you have to: ?0-1 drink a day for women. ?0-2 drinks a day for men. ?Know how much alcohol is in your drink. In the [place] one drink equals one 12 oz bottle of beer (355 mL), one 5 oz glass of wine (148 mL), or one 1? oz glass of hard liquor (44 mL). ?Lifestyle ? ?Work with your doctor to stay at a healthy weight or to lose weight. Ask your doctor what the best weight is for you. ?Get at least 30 minutes of exercise that causes your heart to beat faster (aerobic exercise) most days of the week. This may include walking, swimming, or biking. ?Get at least 30 minutes of exercise that strengthens your muscles (resistance exercise) at least 3 days a week. This may include lifting weights or doing Pilates. ?Do not smoke or use any products that contain nicotine or tobacco. If you need help quitting, ask your doctor. ?Check your blood pressure at home as told by your doctor. ?Keep all follow-up visits. ?Medicines ?Take over-the-counter and prescription medicines   only as told by your doctor. Follow directions carefully. ?Do not skip doses of blood pressure medicine. The medicine does not work as well if you skip doses. Skipping doses also puts you at risk for problems. ?Ask your doctor about side effects or reactions to medicines that you should watch  for. ?Contact a doctor if: ?You think you are having a reaction to the medicine you are taking. ?You have headaches that keep coming back. ?You feel dizzy. ?You have swelling in your ankles. ?You have trouble with your vision. ?Get help right away if: ?You get a very bad headache. ?You start to feel mixed up (confused). ?You feel weak or numb. ?You feel faint. ?You have very bad pain in your: ?Chest. ?Belly (abdomen). ?You vomit more than once. ?You have trouble breathing. ?These symptoms may be an emergency. Get help right away. Call 911. ?Do not wait to see if the symptoms will go away. ?Do not drive yourself to the hospital. ?Summary ?Hypertension is another name for high blood pressure. ?High blood pressure forces your heart to work harder to pump blood. ?For most people, a normal blood pressure is less than 120/80. ?Making healthy choices can help lower blood pressure. If your blood pressure does not get lower with healthy choices, you may need to take medicine. ?This information is not intended to replace advice given to you by your health care provider. Make sure you discuss any questions you have with your health care provider. ?Document Revised: 08/02/2021 Document Reviewed: 08/02/2021 ?Elsevier Patient Education ? 2023 Elsevier Inc. ? ?

## 2023-02-14 NOTE — Progress Notes (Signed)
Patient presents today for a BP check. Patient currently taking amLODipine , hydralazine . She reports taking both in the morning. Denies headache, chest pain, SOB. She admits taking medications this morning before appointment. Around 8am.  BP Readings from Last 3 Encounters:  02/14/23 (!) 144/78  02/05/23 (!) 158/90  01/27/23 (!) 159/75  Per Provider- good continue with current medications. Keep BP log  and report if over 130/80

## 2023-02-23 NOTE — Progress Notes (Unsigned)
   Cardiology Clinic Note   Date: 02/23/2023 ID: [name], [name] 04/03/1951, MRN [id number]  Primary Cardiologist:  Rollene Rotunda, MD  Patient Profile    Kathryn Mendez is a 72 y.o. female who presents to the clinic today for hospital follow-up.   Past medical history significant for: Nonobstructive CAD. Echo 01/25/2023: EF 60 to 65%.  Grade I DD.  Trivial MR.  Aortic valve sclerosis without stenosis. Coronary CTA 01/27/2023: Calcium score of 20.4 (59th percentile) minimal calcified plaque (<25%) in LAD.  Aortic atherosclerosis. Hypertension.  Hyperlipidemia. Lipid panel 01/25/2023: LDL 101, HDL 83, TG 63, total 197. LPa 01/25/2023: 85.9.   History of Present Illness    Kathryn Mendez was first evaluated by Dr. Antoine Poche on 01/24/2023 for chest pain during hospital admission.  Patient presented to the ED via EMS from her doctor's office for a 1 week history of intermittent chest pain radiating to left arm, back, face and headache at the top of her forehead.  Aspirin 324 mg in NTG x 1 improve pain.  Prior to arrival to ED patient and gurney fell over resulting in a mild abrasion to left arm-no head trauma or LOC.  Troponin negative x 2.  Patient underwent ischemic evaluation with echo coronary CTA which showed normal LV function and minimal calcified plaque in LAD (detailed above).  Patient was discharged on 01/27/2023 with plan for risk factor modification and follow-up as an outpatient.  Today, patient ***  Nonobstructive CAD.  Coronary CTA April 2024 showed minimal calcified plaque in LAD.  Patient*** Continue amlodipine, simvastatin, as needed SL NTG.  Aspirin not started per Dr. Flora Lipps. Hypertension. BP today *** Patient denies headaches, dizziness or vision changes. Continue amlodipine, hydralazine. Hyperlipidemia.  LDL March 2024 101, not at goal.  LPa elevated to 85.9.  Continue rosuvastatin.  Will get repeat lipid panel and LFTs sometime in June.  ROS: All other  systems reviewed and are otherwise negative except as noted in History of Present Illness.  Studies Reviewed    ECG personally reviewed by me today: ***  No significant changes from ***  Risk Assessment/Calculations    {Does this patient have ATRIAL FIBRILLATION?:[id number]} No BP recorded.  {Refresh Note OR Click here to enter BP  :1}***        Physical Exam    VS:  There were no vitals taken for this visit. , BMI There is no height or weight on file to calculate BMI.  GEN: Well nourished, well developed, in no acute distress. Neck: No JVD or carotid bruits. Cardiac: *** RRR. No murmurs. No rubs or gallops.   Respiratory:  Respirations regular and unlabored. Clear to auscultation without rales, wheezing or rhonchi. GI: Soft, nontender, nondistended. Extremities: Radials/DP/PT 2+ and equal bilaterally. No clubbing or cyanosis. No edema ***  Skin: Warm and dry, no rash. Neuro: Strength intact.  Assessment & Plan   ***  Disposition: ***     {Are you ordering a CV Procedure (e.g. stress test, cath, DCCV, TEE, etc)?   Press F2        :[id number]}   Signed, Etta Grandchild. Norrine Ballester, DNP, NP-C

## 2023-02-24 ENCOUNTER — Ambulatory Visit: Payer: Medicare HMO | Attending: Student | Admitting: Student

## 2023-02-24 ENCOUNTER — Encounter: Payer: Self-pay | Admitting: Student

## 2023-02-24 ENCOUNTER — Ambulatory Visit: Payer: Medicare HMO

## 2023-02-24 VITALS — BP 130/74 | HR 88 | Ht 62.0 in | Wt 165.0 lb

## 2023-02-24 DIAGNOSIS — E785 Hyperlipidemia, unspecified: Secondary | ICD-10-CM

## 2023-02-24 DIAGNOSIS — I2510 Atherosclerotic heart disease of native coronary artery without angina pectoris: Secondary | ICD-10-CM

## 2023-02-24 DIAGNOSIS — I10 Essential (primary) hypertension: Secondary | ICD-10-CM | POA: Diagnosis not present

## 2023-02-24 NOTE — Patient Instructions (Signed)
Medication Instructions:  Your physician recommends that you continue on your current medications as directed. Please refer to the Current Medication list given to you today.  *If you need a refill on your cardiac medications before your next appointment, please call your pharmacy*   Lab Work: Your physician recommends that you return for lab in June or July to have the following labs drawn: Lipid Panel and Lft's  If you have labs (blood work) drawn today and your tests are completely normal, you will receive your results only by: MyChart Message (if you have MyChart) OR A paper copy in the mail If you have any lab test that is abnormal or we need to change your treatment, we will call you to review the results.   Testing/Procedures: NONE   Follow-Up: At [facility], you and your health needs are our priority.  As part of our continuing mission to provide you with exceptional heart care, we have created designated Provider Care Teams.  These Care Teams include your primary Cardiologist (physician) and Advanced Practice Providers (APPs -  Physician Assistants and Nurse Practitioners) who all work together to provide you with the care you need, when you need it.  We recommend signing up for the patient portal called "MyChart".  Sign up information is provided on this After Visit Summary.  MyChart is used to connect with patients for Virtual Visits (Telemedicine).  Patients are able to view lab/test results, encounter notes, upcoming appointments, etc.  Non-urgent messages can be sent to your provider as well.   To learn more about what you can do with MyChart, go to [email or web address].    Your next appointment:   6 month(s)  Provider:   Rollene Rotunda, MD

## 2023-02-24 NOTE — Progress Notes (Signed)
   Cardiology Clinic Note   Date: 02/24/2023 ID: Kathryn Mendez, Kathryn Mendez December 15, 1950, MRN [id number]  Primary Cardiologist:  Rollene Rotunda, MD  Patient Profile    Kathryn Mendez is a 72 y.o. female who presents to the clinic today for hospital follow-up.   Past medical history significant for: Nonobstructive CAD. Echo 01/25/2023: EF 60 to 65%.  Grade I DD.  Trivial MR.  Aortic valve sclerosis without stenosis. Coronary CTA 01/27/2023: Calcium score of 20.4 (59th percentile) minimal calcified plaque (<25%) in LAD.  Aortic atherosclerosis. Hypertension.  Hyperlipidemia. Lipid panel 01/25/2023: LDL 101, HDL 83, TG 63, total 197. LPa 01/25/2023: 85.9.   History of Present Illness    Kathryn Mendez was first evaluated by Dr. Antoine Poche on 01/24/2023 for chest pain during hospital admission.  Patient presented to the ED via EMS from her doctor's office for a 1 week history of intermittent chest pain radiating to left arm, back, face and headache at the top of her forehead.  Aspirin 324 mg and NTG x 1 improve pain.  Prior to arrival to ED patient and gurney fell over resulting in a mild abrasion to left arm-no head trauma or LOC.  Troponin negative x 2.  Patient underwent ischemic evaluation with echo coronary CTA which showed normal LV function and minimal calcified plaque in LAD (detailed above).  Patient was discharged on 01/27/2023 with plan for risk factor modification and follow-up as an outpatient.  Today, patient is doing well. Patient denies shortness of breath or dyspnea on exertion. No chest pain, pressure, or tightness. Denies lower extremity edema, orthopnea, or PND. No palpitations.  She has changed the way she eats having more whole foods and vegetables. She has started a walking program and walks 20 minutes three times a week. Her BP at home is stable.    ROS: All other systems reviewed and are otherwise negative except as noted in History of Present Illness.  Studies  Reviewed    ECG is not ordered today.          Physical Exam    VS:  BP 130/74 (BP Location: Left Arm, Patient Position: Sitting, Cuff Size: Normal)   Pulse 88   Ht 5\' 2"  (1.575 m)   Wt 165 lb (74.8 kg)   BMI 30.18 kg/m  , BMI Body mass index is 30.18 kg/m.  GEN: Well nourished, well developed, in no acute distress. Neck: No JVD or carotid bruits. Cardiac:  RRR. No murmurs. No rubs or gallops.   Respiratory:  Respirations regular and unlabored. Clear to auscultation without rales, wheezing or rhonchi. GI: Soft, nontender, nondistended. Extremities: Radials/DP/PT 2+ and equal bilaterally. No clubbing or cyanosis. No edema.  Skin: Warm and dry, no rash. Neuro: Strength intact.  Assessment & Plan    Nonobstructive CAD.  Coronary CTA April 2024 showed minimal calcified plaque in LAD.  Patient denies chest pain, pressure, tightness. Continue amlodipine, Crestor, as needed SL NTG.  Aspirin not started per Dr. Flora Lipps. Hypertension. BP today 130/74. Patient denies headaches, dizziness or vision changes. Continue amlodipine, hydralazine. Hyperlipidemia.  LDL March 2024 101, not at goal.  LPa elevated to 85.9.  Continue rosuvastatin.  Will get repeat lipid panel and LFTs sometime in June.  Disposition: Lipid panel and LFTs in June. Return in 6 months or sooner as needed.          Signed, Etta Grandchild. Kathryn Ramaker, DNP, NP-C

## 2023-03-06 ENCOUNTER — Other Ambulatory Visit (HOSPITAL_COMMUNITY): Payer: Self-pay

## 2023-03-06 DIAGNOSIS — H524 Presbyopia: Secondary | ICD-10-CM | POA: Diagnosis not present

## 2023-03-12 ENCOUNTER — Ambulatory Visit
Admission: RE | Admit: 2023-03-12 | Discharge: 2023-03-12 | Disposition: A | Discharge status: Home / Self Care | Payer: Medicare HMO | Source: Ambulatory Visit | Attending: Internal Medicine | Admitting: Internal Medicine

## 2023-03-12 DIAGNOSIS — Z1231 Encounter for screening mammogram for malignant neoplasm of breast: Secondary | ICD-10-CM

## 2023-04-08 DIAGNOSIS — E669 Obesity, unspecified: Secondary | ICD-10-CM | POA: Diagnosis not present

## 2023-04-08 DIAGNOSIS — Z1211 Encounter for screening for malignant neoplasm of colon: Secondary | ICD-10-CM | POA: Diagnosis not present

## 2023-04-08 DIAGNOSIS — I10 Essential (primary) hypertension: Secondary | ICD-10-CM | POA: Diagnosis not present

## 2023-04-08 DIAGNOSIS — M109 Gout, unspecified: Secondary | ICD-10-CM | POA: Diagnosis not present

## 2023-04-08 DIAGNOSIS — Z86010 Personal history of colonic polyps: Secondary | ICD-10-CM | POA: Diagnosis not present

## 2023-04-08 DIAGNOSIS — E782 Mixed hyperlipidemia: Secondary | ICD-10-CM | POA: Diagnosis not present

## 2023-04-08 DIAGNOSIS — K5730 Diverticulosis of large intestine without perforation or abscess without bleeding: Secondary | ICD-10-CM | POA: Diagnosis not present

## 2023-04-14 ENCOUNTER — Other Ambulatory Visit: Payer: Self-pay

## 2023-04-14 DIAGNOSIS — E785 Hyperlipidemia, unspecified: Secondary | ICD-10-CM | POA: Diagnosis not present

## 2023-04-15 LAB — HEPATIC FUNCTION PANEL
ALT: 16 IU/L (ref 0–32)
AST: 19 IU/L (ref 0–40)
Albumin: 4.1 g/dL (ref 3.8–4.8)
Alkaline Phosphatase: 93 IU/L (ref 44–121)
Bilirubin Total: 0.4 mg/dL (ref 0.0–1.2)
Bilirubin, Direct: 0.13 mg/dL (ref 0.00–0.40)
Total Protein: 6.8 g/dL (ref 6.0–8.5)

## 2023-04-15 LAB — LIPID PANEL
Chol/HDL Ratio: 1.9 ratio (ref 0.0–4.4)
Cholesterol, Total: 184 mg/dL (ref 100–199)
HDL: 97 mg/dL (ref >39)
LDL Chol Calc (NIH): 77 mg/dL (ref 0–99)
Triglycerides: 52 mg/dL (ref 0–149)
VLDL Cholesterol Cal: 10 mg/dL (ref 5–40)

## 2023-04-24 ENCOUNTER — Other Ambulatory Visit (HOSPITAL_COMMUNITY): Payer: Self-pay

## 2023-05-08 ENCOUNTER — Encounter: Payer: Self-pay | Admitting: Internal Medicine

## 2023-05-08 ENCOUNTER — Ambulatory Visit (INDEPENDENT_AMBULATORY_CARE_PROVIDER_SITE_OTHER): Payer: Medicare HMO | Admitting: Internal Medicine

## 2023-05-08 VITALS — BP 134/82 | HR 74 | Temp 98.0°F | Ht 62.0 in | Wt 161.6 lb

## 2023-05-08 DIAGNOSIS — E7800 Pure hypercholesterolemia, unspecified: Secondary | ICD-10-CM | POA: Diagnosis not present

## 2023-05-08 DIAGNOSIS — Z6829 Body mass index (BMI) 29.0-29.9, adult: Secondary | ICD-10-CM | POA: Diagnosis not present

## 2023-05-08 DIAGNOSIS — I2510 Atherosclerotic heart disease of native coronary artery without angina pectoris: Secondary | ICD-10-CM

## 2023-05-08 DIAGNOSIS — I10 Essential (primary) hypertension: Secondary | ICD-10-CM | POA: Diagnosis not present

## 2023-05-08 DIAGNOSIS — G8929 Other chronic pain: Secondary | ICD-10-CM | POA: Insufficient documentation

## 2023-05-08 DIAGNOSIS — I119 Hypertensive heart disease without heart failure: Secondary | ICD-10-CM

## 2023-05-08 DIAGNOSIS — I2584 Coronary atherosclerosis due to calcified coronary lesion: Secondary | ICD-10-CM

## 2023-05-08 DIAGNOSIS — R519 Headache, unspecified: Secondary | ICD-10-CM

## 2023-05-08 MED ORDER — HYDRALAZINE HCL 10 MG PO TABS: ORAL_TABLET | ORAL | 1 refills | Status: DC

## 2023-05-08 NOTE — Assessment & Plan Note (Signed)
CCTA results reviewed. She is encouraged to follow a heart healthy lifestyle. Admits she has not been exercising as much due to the heat. Most recent Cardiology note reviewed.

## 2023-05-08 NOTE — Assessment & Plan Note (Signed)
Fair control; however, she states home readings are in 140s-150s systolic. I think this is likely contributing to her headaches. I will increase hydralazine to 10mg  TID. She agrees to rto in 2 weeks for a nurse visit. I will adjust meds further as needed. Unfortunately, she is having LE edema due to amlodipine. Advised to elevate her legs when seated and to wear compression hose.

## 2023-05-08 NOTE — Progress Notes (Signed)
I,Victoria T Deloria Lair, CMA,acting as a Neurosurgeon for Gwynneth Aliment, MD.,have documented all relevant documentation on the behalf of Gwynneth Aliment, MD,as directed by  Gwynneth Aliment, MD while in the presence of Gwynneth Aliment, MD.  Subjective:  Patient ID: Kathryn Mendez , female    DOB: 1951-09-14 , 72 y.o.   MRN: [id number]  Chief Complaint  Patient presents with   Hypertension   Hyperlipidemia    HPI  The patient is here today for a blood pressure and cholesterol follow-up.  She reports compliance with meds. She denies chest pain and shortness of breath. She admits having a " nagging " headache.  She adds, noticing more swelling in her feet.   Hypertension This is a chronic problem. The current episode started more than 1 year ago. The problem has been gradually improving since onset. The problem is controlled. Pertinent negatives include no blurred vision, chest pain, palpitations or shortness of breath. Risk factors for coronary artery disease include dyslipidemia, post-menopausal state, sedentary lifestyle and obesity. The current treatment provides moderate improvement. Hypertensive end-organ damage includes kidney disease.     Past Medical History:  Diagnosis Date   High cholesterol    Hypertension      Family History  Problem Relation Age of Onset   Diabetes Mother    Hypertension Mother    Stroke Mother    Heart attack Father 75   Stroke Father    Diabetes Sister      Current Outpatient Medications:    albuterol (VENTOLIN HFA) 108 (90 Base) MCG/ACT inhaler, Inhale 2 puffs into the lungs every 4 (four) hours as needed for wheezing or shortness of breath., Disp: 18 g, Rfl: 3   allopurinol (ZYLOPRIM) 100 MG tablet, TAKE 1 TABLET(100 MG) BY MOUTH DAILY, Disp: 90 tablet, Rfl: 2   amLODipine (NORVASC) 10 MG tablet, Take 1 tablet (10 mg total) by mouth daily., Disp: 90 tablet, Rfl: 3   ibuprofen (ADVIL) 200 MG tablet, Take 400 mg by mouth daily as needed for moderate  pain or headache., Disp: , Rfl:    nitroGLYCERIN (NITROSTAT) 0.4 MG SL tablet, Place 1 tablet (0.4 mg total) under the tongue every 5 (five) minutes x 3 doses as needed for chest pain., Disp: 25 tablet, Rfl: 2   rosuvastatin (CRESTOR) 20 MG tablet, Take 1 tablet (20 mg total) by mouth daily., Disp: 90 tablet, Rfl: 3   Vitamin D, Ergocalciferol, (DRISDOL) 1.25 MG (50000 UNIT) CAPS capsule, TAKE 1 CAPSULE BY MOUTH ON TUEDAYS AND FRIDAYS, Disp: 24 capsule, Rfl: 0   hydrALAZINE (APRESOLINE) 10 MG tablet, TAKE 1 TABLET BY MOUTH three times daily, Disp: 270 tablet, Rfl: 1   Allergies  Allergen Reactions   Valsartan Anaphylaxis    angioedema     Review of Systems  Constitutional: Negative.   Eyes:  Negative for blurred vision.  Respiratory: Negative.  Negative for shortness of breath.   Cardiovascular: Negative.  Negative for chest pain and palpitations.  Neurological: Negative.   Psychiatric/Behavioral: Negative.       Today's Vitals   05/08/23 0824  BP: 134/82  Pulse: 74  Temp: 98 F (36.7 C)  SpO2: 98%  Weight: 161 lb 9.6 oz (73.3 kg)  Height: 5\' 2"  (1.575 m)   Body mass index is 29.56 kg/m.  Wt Readings from Last 3 Encounters:  05/08/23 161 lb 9.6 oz (73.3 kg)  02/24/23 165 lb (74.8 kg)  02/14/23 165 lb (74.8 kg)     Objective:  Physical Exam Vitals and nursing note reviewed.  Constitutional:      Appearance: Normal appearance.  HENT:     Head: Normocephalic and atraumatic.  Eyes:     Extraocular Movements: Extraocular movements intact.  Cardiovascular:     Rate and Rhythm: Normal rate and regular rhythm.     Heart sounds: Normal heart sounds.  Pulmonary:     Effort: Pulmonary effort is normal.     Breath sounds: Normal breath sounds.  Musculoskeletal:     Cervical back: Normal range of motion.     Right lower leg: Edema present.     Left lower leg: Edema present.  Skin:    General: Skin is warm.  Neurological:     General: No focal deficit present.      Mental Status: She is alert.  Psychiatric:        Mood and Affect: Mood normal.        Behavior: Behavior normal.         Assessment And Plan:  Hypertensive heart disease without heart failure Assessment & Plan: Fair control; however, she states home readings are in 140s-150s systolic. I think this is likely contributing to her headaches. I will increase hydralazine to 10mg  TID. She agrees to rto in 2 weeks for a nurse visit. I will adjust meds further as needed. Unfortunately, she is having LE edema due to amlodipine. Advised to elevate her legs when seated and to wear compression hose.  Orders: -     Amb Referral To Provider Referral Exercise Program (P.R.E.P)  Coronary artery calcification Assessment & Plan: CCTA results reviewed. She is encouraged to follow a heart healthy lifestyle. Admits she has not been exercising as much due to the heat. Most recent Cardiology note reviewed.   Orders: -     Amb Referral To Provider Referral Exercise Program (P.R.E.P)  Pure hypercholesterolemia Assessment & Plan: LDL goal < 70. Encouraged to continue with rosuvastatin 20mg  daily.    Orders: -     Amb Referral To Provider Referral Exercise Program (P.R.E.P)  Body mass index (BMI) of 29.0 to 29.9 in adult Assessment & Plan: She is encouraged to aim for at least 150 minutes of exercise per week. She agrees to referral to the PREP program. Prefers to go to [facility] location.   Orders: -     Amb Referral To Provider Referral Exercise Program (P.R.E.P)  Chronic nonintractable headache, unspecified headache type Assessment & Plan: This is possibly related to elevated blood pressures.  Also, she has not been taking her allergy meds. I expect her headaches to improve with adequate blood pressure control and with resumption of her allergy meds. She agrees with further evaluation if sx persist.    Other orders -     hydrALAZINE HCl; TAKE 1 TABLET BY MOUTH three times daily  Dispense: 270  tablet; Refill: 1     Return in 2 weeks (on 05/22/2023) for nurse visit.  Patient was given opportunity to ask questions. Patient verbalized understanding of the plan and was able to repeat key elements of the plan. All questions were answered to their satisfaction.   I, Gwynneth Aliment, MD, have reviewed all documentation for this visit. The documentation on 05/08/23 for the exam, diagnosis, procedures, and orders are all accurate and complete.   IF YOU HAVE BEEN REFERRED TO A SPECIALIST, IT MAY TAKE 1-2 WEEKS TO SCHEDULE/PROCESS THE REFERRAL. IF YOU HAVE NOT HEARD FROM US/SPECIALIST IN TWO WEEKS, PLEASE GIVE [place] A  CALL AT [phone].   THE PATIENT IS ENCOURAGED TO PRACTICE SOCIAL DISTANCING DUE TO THE COVID-19 PANDEMIC.

## 2023-05-08 NOTE — Assessment & Plan Note (Signed)
LDL goal < 70. Encouraged to continue with rosuvastatin 20mg  daily.

## 2023-05-08 NOTE — Assessment & Plan Note (Signed)
She is encouraged to aim for at least 150 minutes of exercise per week. She agrees to referral to the PREP program. Prefers to go to [facility] location.

## 2023-05-08 NOTE — Assessment & Plan Note (Addendum)
This is possibly related to elevated blood pressures.  Also, she has not been taking her allergy meds. I expect her headaches to improve with adequate blood pressure control and with resumption of her allergy meds. She agrees with further evaluation if sx persist.

## 2023-05-08 NOTE — Patient Instructions (Signed)
Hypertension, Adult Hypertension is another name for high blood pressure. High blood pressure forces your heart to work harder to pump blood. This can cause problems over time. There are two numbers in a blood pressure reading. There is a top number (systolic) over a bottom number (diastolic). It is best to have a blood pressure that is below 120/80. What are the causes? The cause of this condition is not known. Some other conditions can lead to high blood pressure. What increases the risk? Some lifestyle factors can make you more likely to develop high blood pressure: Smoking. Not getting enough exercise or physical activity. Being overweight. Having too much fat, sugar, calories, or salt (sodium) in your diet. Drinking too much alcohol. Other risk factors include: Having any of these conditions: Heart disease. Diabetes. High cholesterol. Kidney disease. Obstructive sleep apnea. Having a family history of high blood pressure and high cholesterol. Age. The risk increases with age. Stress. What are the signs or symptoms? High blood pressure may not cause symptoms. Very high blood pressure (hypertensive crisis) may cause: Headache. Fast or uneven heartbeats (palpitations). Shortness of breath. Nosebleed. Vomiting or feeling like you may vomit (nauseous). Changes in how you see. Very bad chest pain. Feeling dizzy. Seizures. How is this treated? This condition is treated by making healthy lifestyle changes, such as: Eating healthy foods. Exercising more. Drinking less alcohol. Your doctor may prescribe medicine if lifestyle changes do not help enough and if: Your top number is above 130. Your bottom number is above 80. Your personal target blood pressure may vary. Follow these instructions at home: Eating and drinking  If told, follow the DASH eating plan. To follow this plan: Fill one half of your plate at each meal with fruits and vegetables. Fill one fourth of your plate  at each meal with whole grains. Whole grains include whole-wheat pasta, brown rice, and whole-grain bread. Eat or drink low-fat dairy products, such as skim milk or low-fat yogurt. Fill one fourth of your plate at each meal with low-fat (lean) proteins. Low-fat proteins include fish, chicken without skin, eggs, beans, and tofu. Avoid fatty meat, cured and processed meat, or chicken with skin. Avoid pre-made or processed food. Limit the amount of salt in your diet to less than 1,500 mg each day. Do not drink alcohol if: Your doctor tells you not to drink. You are pregnant, may be pregnant, or are planning to become pregnant. If you drink alcohol: Limit how much you have to: 0-1 drink a day for women. 0-2 drinks a day for men. Know how much alcohol is in your drink. In the [place] one drink equals one 12 oz bottle of beer (355 mL), one 5 oz glass of wine (148 mL), or one 1 oz glass of hard liquor (44 mL). Lifestyle  Work with your doctor to stay at a healthy weight or to lose weight. Ask your doctor what the best weight is for you. Get at least 30 minutes of exercise that causes your heart to beat faster (aerobic exercise) most days of the week. This may include walking, swimming, or biking. Get at least 30 minutes of exercise that strengthens your muscles (resistance exercise) at least 3 days a week. This may include lifting weights or doing Pilates. Do not smoke or use any products that contain nicotine or tobacco. If you need help quitting, ask your doctor. Check your blood pressure at home as told by your doctor. Keep all follow-up visits. Medicines Take over-the-counter and prescription medicines   only as told by your doctor. Follow directions carefully. Do not skip doses of blood pressure medicine. The medicine does not work as well if you skip doses. Skipping doses also puts you at risk for problems. Ask your doctor about side effects or reactions to medicines that you should watch  for. Contact a doctor if: You think you are having a reaction to the medicine you are taking. You have headaches that keep coming back. You feel dizzy. You have swelling in your ankles. You have trouble with your vision. Get help right away if: You get a very bad headache. You start to feel mixed up (confused). You feel weak or numb. You feel faint. You have very bad pain in your: Chest. Belly (abdomen). You vomit more than once. You have trouble breathing. These symptoms may be an emergency. Get help right away. Call 911. Do not wait to see if the symptoms will go away. Do not drive yourself to the hospital. Summary Hypertension is another name for high blood pressure. High blood pressure forces your heart to work harder to pump blood. For most people, a normal blood pressure is less than 120/80. Making healthy choices can help lower blood pressure. If your blood pressure does not get lower with healthy choices, you may need to take medicine. This information is not intended to replace advice given to you by your health care provider. Make sure you discuss any questions you have with your health care provider. Document Revised: 08/02/2021 Document Reviewed: 08/02/2021 Elsevier Patient Education  2024 Elsevier Inc.  

## 2023-05-09 ENCOUNTER — Telehealth: Payer: Self-pay

## 2023-05-09 ENCOUNTER — Telehealth: Payer: Self-pay | Admitting: *Deleted

## 2023-05-09 NOTE — Telephone Encounter (Signed)
Contacted regarding PREP Class referral. Interested in participating at the [facility]. Class begins 7/30 T/TH 1330-1445 for 12 weeks. I will call her back to arrange her intake assessment prior to class.

## 2023-05-09 NOTE — Telephone Encounter (Signed)
Called to discuss PREP program; explained PREP, she would like to attend, states [place] is closer for her, will ask Bev RN [facility] to contact her about next classes scheduled there.

## 2023-05-09 NOTE — Telephone Encounter (Signed)
Called re: PREP program referral, she is driving, will call her back later today.

## 2023-05-13 ENCOUNTER — Telehealth: Payer: Self-pay | Admitting: *Deleted

## 2023-05-13 NOTE — Telephone Encounter (Signed)
Contacted to arrange intake assessment for PREP Class beginning 05/27/2023. Intake assessment scheduled for 05/22/2023 at 1:00 pm at the [facility].

## 2023-05-20 ENCOUNTER — Other Ambulatory Visit: Payer: Self-pay | Admitting: Internal Medicine

## 2023-05-22 ENCOUNTER — Ambulatory Visit: Payer: Medicare HMO

## 2023-05-22 ENCOUNTER — Encounter: Payer: Self-pay | Admitting: *Deleted

## 2023-05-22 VITALS — BP 130/80 | HR 85 | Temp 98.1°F | Ht 62.0 in | Wt 161.0 lb

## 2023-05-22 DIAGNOSIS — I119 Hypertensive heart disease without heart failure: Secondary | ICD-10-CM

## 2023-05-22 NOTE — Patient Instructions (Signed)
Hypertension, Adult Hypertension is another name for high blood pressure. High blood pressure forces your heart to work harder to pump blood. This can cause problems over time. There are two numbers in a blood pressure reading. There is a top number (systolic) over a bottom number (diastolic). It is best to have a blood pressure that is below 120/80. What are the causes? The cause of this condition is not known. Some other conditions can lead to high blood pressure. What increases the risk? Some lifestyle factors can make you more likely to develop high blood pressure: Smoking. Not getting enough exercise or physical activity. Being overweight. Having too much fat, sugar, calories, or salt (sodium) in your diet. Drinking too much alcohol. Other risk factors include: Having any of these conditions: Heart disease. Diabetes. High cholesterol. Kidney disease. Obstructive sleep apnea. Having a family history of high blood pressure and high cholesterol. Age. The risk increases with age. Stress. What are the signs or symptoms? High blood pressure may not cause symptoms. Very high blood pressure (hypertensive crisis) may cause: Headache. Fast or uneven heartbeats (palpitations). Shortness of breath. Nosebleed. Vomiting or feeling like you may vomit (nauseous). Changes in how you see. Very bad chest pain. Feeling dizzy. Seizures. How is this treated? This condition is treated by making healthy lifestyle changes, such as: Eating healthy foods. Exercising more. Drinking less alcohol. Your doctor may prescribe medicine if lifestyle changes do not help enough and if: Your top number is above 130. Your bottom number is above 80. Your personal target blood pressure may vary. Follow these instructions at home: Eating and drinking  If told, follow the DASH eating plan. To follow this plan: Fill one half of your plate at each meal with fruits and vegetables. Fill one fourth of your plate  at each meal with whole grains. Whole grains include whole-wheat pasta, brown rice, and whole-grain bread. Eat or drink low-fat dairy products, such as skim milk or low-fat yogurt. Fill one fourth of your plate at each meal with low-fat (lean) proteins. Low-fat proteins include fish, chicken without skin, eggs, beans, and tofu. Avoid fatty meat, cured and processed meat, or chicken with skin. Avoid pre-made or processed food. Limit the amount of salt in your diet to less than 1,500 mg each day. Do not drink alcohol if: Your doctor tells you not to drink. You are pregnant, may be pregnant, or are planning to become pregnant. If you drink alcohol: Limit how much you have to: 0-1 drink a day for women. 0-2 drinks a day for men. Know how much alcohol is in your drink. In the [place] one drink equals one 12 oz bottle of beer (355 mL), one 5 oz glass of wine (148 mL), or one 1 oz glass of hard liquor (44 mL). Lifestyle  Work with your doctor to stay at a healthy weight or to lose weight. Ask your doctor what the best weight is for you. Get at least 30 minutes of exercise that causes your heart to beat faster (aerobic exercise) most days of the week. This may include walking, swimming, or biking. Get at least 30 minutes of exercise that strengthens your muscles (resistance exercise) at least 3 days a week. This may include lifting weights or doing Pilates. Do not smoke or use any products that contain nicotine or tobacco. If you need help quitting, ask your doctor. Check your blood pressure at home as told by your doctor. Keep all follow-up visits. Medicines Take over-the-counter and prescription medicines   only as told by your doctor. Follow directions carefully. Do not skip doses of blood pressure medicine. The medicine does not work as well if you skip doses. Skipping doses also puts you at risk for problems. Ask your doctor about side effects or reactions to medicines that you should watch  for. Contact a doctor if: You think you are having a reaction to the medicine you are taking. You have headaches that keep coming back. You feel dizzy. You have swelling in your ankles. You have trouble with your vision. Get help right away if: You get a very bad headache. You start to feel mixed up (confused). You feel weak or numb. You feel faint. You have very bad pain in your: Chest. Belly (abdomen). You vomit more than once. You have trouble breathing. These symptoms may be an emergency. Get help right away. Call 911. Do not wait to see if the symptoms will go away. Do not drive yourself to the hospital. Summary Hypertension is another name for high blood pressure. High blood pressure forces your heart to work harder to pump blood. For most people, a normal blood pressure is less than 120/80. Making healthy choices can help lower blood pressure. If your blood pressure does not get lower with healthy choices, you may need to take medicine. This information is not intended to replace advice given to you by your health care provider. Make sure you discuss any questions you have with your health care provider. Document Revised: 08/02/2021 Document Reviewed: 08/02/2021 Elsevier Patient Education  2024 Elsevier Inc.  

## 2023-05-22 NOTE — Progress Notes (Signed)
Patient presents today for bpc. At previous visit Hydralazine increased to 10mg  TID. She also takes Amlodipine 10MG . Denies headache, chest pain,SOB. She admits taking medications before appointment today. She reports not getting much sleep last night due to a power outage.  BP Readings from Last 3 Encounters:  05/22/23 130/80  05/08/23 134/82  02/24/23 130/74  Per Rosalee Kaufman, patient will continue current medications and f/u with Dr. Allyne Gee regular time. Patient aware. Appointment scheduled.

## 2023-05-22 NOTE — Progress Notes (Signed)
[facility] PREP Evaluation  Patient Details  Name: Kathryn Mendez MRN: [id number] Date of Birth: 1951/01/01 Age: 72 y.o. PCP: Dorothyann Peng, MD  Vitals:   05/22/23 1315  BP: (!) 154/76  Pulse: 77  Resp: 20  SpO2: 98%  Weight: 166 lb (75.3 kg)  Height: 5\' 2"  (1.575 m)     [facility] Eval - 05/22/23 1330       [facility] "PREP" Location   [facility] "PREP" Location [facility]      Referral    Referring Provider Sanders    Reason for referral High Cholesterol;Hypertension;Inactivity;Obesitity/Overweight;Other   CAD   Program Start Date 05/27/23    Program End Date 08/14/23      Measurement   Waist Circumference 40 inches    Hip Circumference 41.75 inches      Information for Trainer   Goals healthier diet, weight loss 8-10lbs in 12 weeks, establish an exercise routine    Current Exercise walking    Orthopedic Concerns Bilateral knee arthritis, sore/stiff shoulders   self reported   Current Barriers none reported      Mobility and Daily Activities   I find it easy to walk up or down two or more flights of stairs. 3    I have no trouble taking out the trash. 4    I do housework such as vacuuming and dusting on my own without difficulty. 4    I can easily lift a gallon of milk (8lbs). 2    I can easily walk a mile. 1    I have no trouble reaching into high cupboards or reaching down to pick up something from the floor. 2    I do not have trouble doing out-door work such as Loss adjuster, chartered, raking leaves, or gardening. 3      Mobility and Daily Activities   I feel younger than my age. 3    I feel independent. 3    I feel energetic. 2    I live an active life.  3    I feel strong. 3    I feel healthy. 2    I feel active as other people my age. 3      How fit and strong are you.   Fit and Strong Total Score 38            Past Medical History:  Diagnosis Date   High cholesterol    Hypertension    Past Surgical History:  Procedure Laterality Date    CHOLECYSTECTOMY     TUBAL LIGATION     Social History   Tobacco Use  Smoking Status Never  Smokeless Tobacco Never    Remo Lipps 05/22/2023, 3:57 PM

## 2023-05-27 ENCOUNTER — Encounter: Payer: Self-pay | Admitting: *Deleted

## 2023-05-27 NOTE — Progress Notes (Signed)
[facility] PREP Weekly Session  Patient Details  Name: ALAA IORIO MRN: [id number] Date of Birth: 29-May-1951 Age: 72 y.o. PCP: Dorothyann Peng, MD  There were no vitals filed for this visit.   [facility] Weekly seesion - 05/27/23 1500       [facility] "PREP" Location   [facility] "PREP" Location [facility]      Weekly Session   Topic Discussed Goal setting and welcome to the program    Classes attended to date 1             Remo Lipps 05/27/2023, 9:34 PM

## 2023-05-28 ENCOUNTER — Encounter: Payer: Medicare HMO | Admitting: Internal Medicine

## 2023-05-28 DIAGNOSIS — K621 Rectal polyp: Secondary | ICD-10-CM | POA: Diagnosis not present

## 2023-05-28 DIAGNOSIS — K648 Other hemorrhoids: Secondary | ICD-10-CM | POA: Diagnosis not present

## 2023-05-28 DIAGNOSIS — K5730 Diverticulosis of large intestine without perforation or abscess without bleeding: Secondary | ICD-10-CM | POA: Diagnosis not present

## 2023-05-28 DIAGNOSIS — K635 Polyp of colon: Secondary | ICD-10-CM | POA: Diagnosis not present

## 2023-05-28 DIAGNOSIS — D128 Benign neoplasm of rectum: Secondary | ICD-10-CM | POA: Diagnosis not present

## 2023-05-28 DIAGNOSIS — Z86010 Personal history of colonic polyps: Secondary | ICD-10-CM | POA: Diagnosis not present

## 2023-05-28 DIAGNOSIS — Z1211 Encounter for screening for malignant neoplasm of colon: Secondary | ICD-10-CM | POA: Diagnosis not present

## 2023-05-28 DIAGNOSIS — D122 Benign neoplasm of ascending colon: Secondary | ICD-10-CM | POA: Diagnosis not present

## 2023-05-28 LAB — HM COLONOSCOPY

## 2023-06-04 ENCOUNTER — Other Ambulatory Visit (HOSPITAL_COMMUNITY): Payer: Self-pay

## 2023-06-04 ENCOUNTER — Encounter: Payer: Self-pay | Admitting: *Deleted

## 2023-06-04 NOTE — Progress Notes (Signed)
[facility] PREP Weekly Session  Patient Details  Name: Kathryn Mendez MRN: [id number] Date of Birth: June 20, 1951 Age: 72 y.o. PCP: Dorothyann Peng, MD  Vitals:   06/03/23 1330  Weight: 163 lb (73.9 kg)     [facility] Weekly seesion - 06/03/23 1500       [facility] "PREP" Location   [facility] "PREP" Location [facility]      Weekly Session   Topic Discussed Other ways to be active;Importance of resistance training   Review national standards for cardiovascular exercise: 150 minutes/week. Strength training:2-4 days/week (20-40 min sessions). Balance work and cardio machines   Minutes exercised this week 60 minutes    Classes attended to date 3             Remo Lipps 06/04/2023, 8:07 AM

## 2023-06-10 ENCOUNTER — Encounter: Payer: Self-pay | Admitting: *Deleted

## 2023-06-10 NOTE — Progress Notes (Signed)
[facility] PREP Weekly Session  Patient Details  Name: Kathryn Mendez MRN: [id number] Date of Birth: 01-21-1951 Age: 72 y.o. PCP: Dorothyann Peng, MD  Vitals:   06/10/23 1330  Weight: 163 lb 12.8 oz (74.3 kg)     [facility] Weekly seesion - 06/10/23 1500       [facility]PREP" Location   [facility]      Weekly Session   Topic Discussed Healthy eating tips;Eating for the season   Discuss macronutrients, carbohydrates, fats and protiens. Added sugars 24gms/day for women, 36gms/day for men. Sodium, limit intake 1500-2300mg /day. [employer] App.   Classes attended to date 4             Remo Lipps 06/10/2023, 3:55 PM

## 2023-06-17 ENCOUNTER — Encounter: Payer: Self-pay | Admitting: *Deleted

## 2023-06-17 NOTE — Progress Notes (Signed)
[facility] PREP Weekly Session  Patient Details  Name: Kathryn Mendez MRN: [id number] Date of Birth: 06/28/1951 Age: 72 y.o. PCP: Dorothyann Peng, MD  Vitals:   06/17/23 1330  Weight: 162 lb (73.5 kg)     [facility] Weekly seesion - 06/17/23 1500       [facility] "PREP" Location   [facility] "PREP" Location [facility]      Weekly Session   Topic Discussed Health habits;Water   Review healthy eating, tips for maintaining healthy lifestyle, discuss effects of sugar, tips for reducing sugar and sugar demo.Daily added sugars: 24gm/day for women, 36gm/day for men   Minutes exercised this week 45 minutes    Classes attended to date 65             Remo Lipps 06/17/2023, 8:23 PM

## 2023-06-24 NOTE — Patient Instructions (Signed)

## 2023-06-25 ENCOUNTER — Encounter: Payer: Self-pay | Admitting: Internal Medicine

## 2023-06-25 ENCOUNTER — Encounter: Payer: Self-pay | Admitting: *Deleted

## 2023-06-25 ENCOUNTER — Ambulatory Visit (INDEPENDENT_AMBULATORY_CARE_PROVIDER_SITE_OTHER): Payer: Medicare HMO | Admitting: Internal Medicine

## 2023-06-25 VITALS — BP 130/84 | HR 79 | Temp 97.6°F | Ht 62.0 in | Wt 159.0 lb

## 2023-06-25 DIAGNOSIS — N6342 Unspecified lump in left breast, subareolar: Secondary | ICD-10-CM

## 2023-06-25 DIAGNOSIS — R82998 Other abnormal findings in urine: Secondary | ICD-10-CM

## 2023-06-25 DIAGNOSIS — R7309 Other abnormal glucose: Secondary | ICD-10-CM | POA: Diagnosis not present

## 2023-06-25 DIAGNOSIS — I2584 Coronary atherosclerosis due to calcified coronary lesion: Secondary | ICD-10-CM

## 2023-06-25 DIAGNOSIS — I2510 Atherosclerotic heart disease of native coronary artery without angina pectoris: Secondary | ICD-10-CM | POA: Diagnosis not present

## 2023-06-25 DIAGNOSIS — M25512 Pain in left shoulder: Secondary | ICD-10-CM

## 2023-06-25 DIAGNOSIS — M25511 Pain in right shoulder: Secondary | ICD-10-CM | POA: Diagnosis not present

## 2023-06-25 DIAGNOSIS — Z23 Encounter for immunization: Secondary | ICD-10-CM

## 2023-06-25 DIAGNOSIS — G8929 Other chronic pain: Secondary | ICD-10-CM

## 2023-06-25 DIAGNOSIS — Z0000 Encounter for general adult medical examination without abnormal findings: Secondary | ICD-10-CM | POA: Diagnosis not present

## 2023-06-25 DIAGNOSIS — M1A2790 Drug-induced chronic gout, unspecified ankle and foot, without tophus (tophi): Secondary | ICD-10-CM

## 2023-06-25 DIAGNOSIS — E7800 Pure hypercholesterolemia, unspecified: Secondary | ICD-10-CM | POA: Diagnosis not present

## 2023-06-25 DIAGNOSIS — I119 Hypertensive heart disease without heart failure: Secondary | ICD-10-CM | POA: Diagnosis not present

## 2023-06-25 DIAGNOSIS — I10 Essential (primary) hypertension: Secondary | ICD-10-CM

## 2023-06-25 LAB — POCT URINALYSIS DIPSTICK
Bilirubin, UA: NEGATIVE
Blood, UA: NEGATIVE
Glucose, UA: NEGATIVE
Ketones, UA: NEGATIVE
Nitrite, UA: NEGATIVE
Protein, UA: POSITIVE — AB
Spec Grav, UA: 1.015 (ref 1.010–1.025)
Urobilinogen, UA: 0.2 E.U./dL
pH, UA: 6 (ref 5.0–8.0)

## 2023-06-25 MED ORDER — ALLOPURINOL 100 MG PO TABS: ORAL_TABLET | ORAL | 2 refills | Status: DC

## 2023-06-25 MED ORDER — HYDRALAZINE HCL 10 MG PO TABS: ORAL_TABLET | ORAL | 1 refills | Status: DC

## 2023-06-25 NOTE — Progress Notes (Signed)
I,Victoria T Deloria Lair, CMA,acting as a Neurosurgeon for Gwynneth Aliment, MD.,have documented all relevant documentation on the behalf of Gwynneth Aliment, MD,as directed by  Gwynneth Aliment, MD while in the presence of Gwynneth Aliment, MD.  Subjective:  Patient ID: Kathryn Mendez , female    DOB: 1951-01-26 , 72 y.o.   MRN: [id number]  Chief Complaint  Patient presents with   Annual Exam   Hypertension   Hyperlipidemia    HPI  Patient presents today for HM. She is no longer followed by GYN. She reports compliance with BP meds. She denies headaches, chest pain and shortness of breath.      Hypertension This is a chronic problem. The current episode started more than 1 year ago. The problem has been gradually improving since onset. The problem is controlled. Pertinent negatives include no blurred vision, chest pain, palpitations or shortness of breath. Risk factors for coronary artery disease include dyslipidemia, post-menopausal state, sedentary lifestyle and obesity. The current treatment provides moderate improvement. Hypertensive end-organ damage includes kidney disease.  Hyperlipidemia This is a chronic problem. The current episode started more than 1 year ago. Exacerbating diseases include obesity. Pertinent negatives include no chest pain or shortness of breath. Current antihyperlipidemic treatment includes statins. The current treatment provides moderate improvement of lipids.     Past Medical History:  Diagnosis Date   High cholesterol    Hypertension      Family History  Problem Relation Age of Onset   Diabetes Mother    Hypertension Mother    Stroke Mother    Heart attack Father 40   Stroke Father    Diabetes Sister      Current Outpatient Medications:    albuterol (VENTOLIN HFA) 108 (90 Base) MCG/ACT inhaler, Inhale 2 puffs into the lungs every 4 (four) hours as needed for wheezing or shortness of breath., Disp: 18 g, Rfl: 3   amLODipine (NORVASC) 10 MG tablet, Take  1 tablet (10 mg total) by mouth daily., Disp: 90 tablet, Rfl: 3   ibuprofen (ADVIL) 200 MG tablet, Take 400 mg by mouth daily as needed for moderate pain or headache., Disp: , Rfl:    rosuvastatin (CRESTOR) 20 MG tablet, Take 1 tablet (20 mg total) by mouth daily., Disp: 90 tablet, Rfl: 3   Vitamin D, Ergocalciferol, (DRISDOL) 1.25 MG (50000 UNIT) CAPS capsule, TAKE 1 CAPSULE BY MOUTH ON TUEDAYS AND FRIDAYS, Disp: 24 capsule, Rfl: 0   allopurinol (ZYLOPRIM) 100 MG tablet, TAKE 1 TABLET(100 MG) BY MOUTH DAILY, Disp: 90 tablet, Rfl: 2   amoxicillin-clavulanate (AUGMENTIN) 500-125 MG tablet, Take 1 tablet by mouth every 12 (twelve) hours., Disp: 10 tablet, Rfl: 0   hydrALAZINE (APRESOLINE) 10 MG tablet, TAKE 1 TABLET BY MOUTH three times daily, Disp: 270 tablet, Rfl: 1   nitroGLYCERIN (NITROSTAT) 0.4 MG SL tablet, Place 1 tablet (0.4 mg total) under the tongue every 5 (five) minutes x 3 doses as needed for chest pain. (Patient not taking: Reported on 06/25/2023), Disp: 25 tablet, Rfl: 2   Allergies  Allergen Reactions   Valsartan Anaphylaxis    angioedema     Review of Systems  Constitutional: Negative.   HENT: Negative.    Eyes: Negative.  Negative for blurred vision.  Respiratory: Negative.  Negative for shortness of breath.   Cardiovascular: Negative.  Negative for chest pain and palpitations.  Gastrointestinal: Negative.   Endocrine: Negative.   Genitourinary: Negative.   Musculoskeletal: Negative.   Skin: Negative.  Allergic/Immunologic: Negative.   Neurological: Negative.   Hematological: Negative.   Psychiatric/Behavioral: Negative.       Today's Vitals   06/25/23 0836  BP: 130/84  Pulse: 79  Temp: 97.6 F (36.4 C)  SpO2: 98%  Weight: 159 lb (72.1 kg)  Height: 5\' 2"  (1.575 m)   Body mass index is 29.08 kg/m.  Wt Readings from Last 3 Encounters:  07/01/23 163 lb 4.8 oz (74.1 kg)  06/25/23 162 lb (73.5 kg)  06/25/23 159 lb (72.1 kg)     Objective:  Physical  Exam Vitals and nursing note reviewed.  Constitutional:      Appearance: Normal appearance.  HENT:     Head: Normocephalic and atraumatic.     Right Ear: Tympanic membrane, ear canal and external ear normal.     Left Ear: Tympanic membrane, ear canal and external ear normal.     Nose: Nose normal.     Mouth/Throat:     Mouth: Mucous membranes are moist.     Pharynx: Oropharynx is clear.  Eyes:     Extraocular Movements: Extraocular movements intact.     Conjunctiva/sclera: Conjunctivae normal.     Pupils: Pupils are equal, round, and reactive to light.  Cardiovascular:     Rate and Rhythm: Normal rate and regular rhythm.     Pulses: Normal pulses.     Heart sounds: Normal heart sounds.  Pulmonary:     Effort: Pulmonary effort is normal.     Breath sounds: Normal breath sounds.  Chest:  Breasts:    Tanner Score is 5.     Right: Normal.     Comments: Left subareolar mass Abdominal:     General: Bowel sounds are normal.     Palpations: Abdomen is soft.  Genitourinary:    Comments: deferred Musculoskeletal:        General: Tenderness present.     Cervical back: Normal range of motion and neck supple.  Skin:    General: Skin is warm and dry.  Neurological:     General: No focal deficit present.     Mental Status: She is alert and oriented to person, place, and time.  Psychiatric:        Mood and Affect: Mood normal.        Behavior: Behavior normal.         Assessment And Plan:  Annual physical exam Assessment & Plan: A full exam was performed.  Importance of monthly self breast exams was discussed with the patient.  She is advised to get 30-45 minutes of regular exercise, no less than four to five days per week. Both weight-bearing and aerobic exercises are recommended.  She is advised to follow a healthy diet with at least six fruits/veggies per day, decrease intake of red meat and other saturated fats and to increase fish intake to twice weekly.  Meats/fish should not  be fried -- baked, boiled or broiled is preferable. It is also important to cut back on your sugar intake.  Be sure to read labels - try to avoid anything with added sugar, high fructose corn syrup or other sweeteners.  If you must use a sweetener, you can try stevia or monkfruit.  It is also important to avoid artificially sweetened foods/beverages and diet drinks. Lastly, wear SPF 50 sunscreen on exposed skin and when in direct sunlight for an extended period of time.  Be sure to avoid fast food restaurants and aim for at least 60 ounces of water daily.  Hypertensive heart disease without heart failure Assessment & Plan: Chronic, fair control. Goal BP<120/80.  EKG performed, NSR w/o acute changes.  Encouraged to follow low sodium diet. She will continue with amlodipine 10mg  daily, hydralazine 10mg  three times daily. If elevated at next visit, will consider adding another agent. She will f/u in four to six months.   Orders: -     CBC -     POCT urinalysis dipstick -     Microalbumin / creatinine urine ratio -     EKG 12-Lead -     BMP8+eGFR -     Ambulatory referral to Ophthalmology  Coronary artery calcification Assessment & Plan: CCTA results reviewed. She is encouraged to follow a heart healthy lifestyle. She is encouraged to aim for at least 150 minutes of exercise/week.  Most recent Cardiology note reviewed.      Pure hypercholesterolemia Assessment & Plan: LDL goal < 70. Encouraged to continue with rosuvastatin 20mg  daily.     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 She will continue with allopurinol 100mg  daily. She is encouraged to avoid known triggers.   Orders: -     Uric acid  Leukocytes in urine Assessment & Plan: Urinalysis is suggestive of UTI, I will send off urine culture.   Orders: -     Urine Culture  Chronic pain of both shoulders Assessment &  Plan: She agrees to Ortho evaluation for radiographic studies and further evaluation.   Orders: -     Ambulatory referral to Orthopedic Surgery  Subareolar mass of left breast Assessment & Plan: She has had previous studies including diagnostic mammo and u/s which revealed stable hamartoma.    Other abnormal glucose Assessment & Plan: Previous labs reviewed, her A1c has been elevated in the past. I will check an A1c today. Reminded to avoid refined sugars including sugary drinks/foods and processed meats including bacon, sausages and deli meats.    Orders: -     Hemoglobin A1c  Immunization due -     Flu Vaccine Trivalent High Dose (Fluad)  Other orders -     Allopurinol; TAKE 1 TABLET(100 MG) BY MOUTH DAILY  Dispense: 90 tablet; Refill: 2 -     hydrALAZINE HCl; TAKE 1 TABLET BY MOUTH three times daily  Dispense: 270 tablet; Refill: 1     Return for 1 year HM .  Patient was given opportunity to ask questions. Patient verbalized understanding of the plan and was able to repeat key elements of the plan. All questions were answered to their satisfaction.  Gwynneth Aliment, MD  I, Gwynneth Aliment, MD, have reviewed all documentation for this visit. The documentation on 07/05/23 for the exam, diagnosis, procedures, and orders are all accurate and complete.   IF YOU HAVE BEEN REFERRED TO A SPECIALIST, IT MAY TAKE 1-2 WEEKS TO SCHEDULE/PROCESS THE REFERRAL. IF YOU HAVE NOT HEARD FROM US/SPECIALIST IN TWO WEEKS, PLEASE GIVE [place] A CALL AT [phone].   THE PATIENT IS ENCOURAGED TO PRACTICE SOCIAL DISTANCING DUE TO THE COVID-19 PANDEMIC.

## 2023-06-25 NOTE — Progress Notes (Signed)
[facility] PREP Weekly Session  Patient Details  Name: Kathryn Mendez MRN: [id number] Date of Birth: 11/22/1950 Age: 72 y.o. PCP: Dorothyann Peng, MD  Vitals:   06/25/23 1330  Weight: 162 lb (73.5 kg)     [facility] Weekly seesion - 06/25/23 1500       [facility] "PREP" Location   [facility] "PREP" Location [facility]      Weekly Session   Topic Discussed Restaurant Eating;Eating for the season   Discussed limiting sodium intake to 1500-2300mg /day. Tips to reduce sodium intake, salt demo. Reading labels on food products and what to look for.   Minutes exercised this week 105 minutes    Classes attended to date 7             Remo Lipps 06/25/2023, 8:56 PM

## 2023-06-26 LAB — MICROALBUMIN / CREATININE URINE RATIO
Creatinine, Urine: 156 mg/dL
Microalb/Creat Ratio: 310 mg/g{creat} — ABNORMAL HIGH (ref 0–29)
Microalbumin, Urine: 484.1 ug/mL

## 2023-06-26 LAB — BMP8+EGFR
BUN/Creatinine Ratio: 14 (ref 12–28)
BUN: 13 mg/dL (ref 8–27)
CO2: 22 mmol/L (ref 20–29)
Calcium: 9.2 mg/dL (ref 8.7–10.3)
Chloride: 105 mmol/L (ref 96–106)
Creatinine, Ser: 0.92 mg/dL (ref 0.57–1.00)
Glucose: 100 mg/dL — ABNORMAL HIGH (ref 70–99)
Potassium: 3.8 mmol/L (ref 3.5–5.2)
Sodium: 143 mmol/L (ref 134–144)
eGFR: 67 mL/min/{1.73_m2} (ref >59)

## 2023-06-26 LAB — CBC
Hematocrit: 42.9 % (ref 34.0–46.6)
Hemoglobin: 14.2 g/dL (ref 11.1–15.9)
MCH: 27.8 pg (ref 26.6–33.0)
MCHC: 33.1 g/dL (ref 31.5–35.7)
MCV: 84 fL (ref 79–97)
Platelets: 336 10*3/uL (ref 150–450)
RBC: 5.1 x10E6/uL (ref 3.77–5.28)
RDW: 12.7 % (ref 11.7–15.4)
WBC: 4.8 10*3/uL (ref 3.4–10.8)

## 2023-06-26 LAB — HEMOGLOBIN A1C
Est. average glucose Bld gHb Est-mCnc: 117 mg/dL
Hgb A1c MFr Bld: 5.7 % — ABNORMAL HIGH (ref 4.8–5.6)

## 2023-06-26 LAB — URIC ACID: Uric Acid: 4.7 mg/dL (ref 3.1–7.9)

## 2023-06-28 LAB — URINE CULTURE

## 2023-06-30 ENCOUNTER — Other Ambulatory Visit: Payer: Self-pay | Admitting: Internal Medicine

## 2023-06-30 MED ORDER — AMOXICILLIN-POT CLAVULANATE 500-125 MG PO TABS: 1.0000 | ORAL_TABLET | Freq: Two times a day (BID) | ORAL | 0 refills | Status: DC

## 2023-07-01 ENCOUNTER — Encounter: Payer: Self-pay | Admitting: *Deleted

## 2023-07-01 NOTE — Progress Notes (Signed)
[facility] PREP Weekly Session  Patient Details  Name: [name] MRN: [id number] Date of Birth: 01-22-1951 Age: 72 y.o. PCP: Dorothyann Peng, MD  Vitals:   07/01/23 1330  Weight: 163 lb 4.8 oz (74.1 kg)     [facility] Weekly seesion - 07/01/23 1500       [facility] "PREP" Location   [facility] "PREP" Location [facility]      Weekly Session   Topic Discussed Stress management and problem solving   Importance of sleep, tips for improving sleep, introduction to meditation, assign revisit goals page in preparation for half way through the program.   Minutes exercised this week 75 minutes    Classes attended to date 9             Remo Lipps 07/01/2023, 8:48 PM

## 2023-07-05 DIAGNOSIS — R82998 Other abnormal findings in urine: Secondary | ICD-10-CM | POA: Insufficient documentation

## 2023-07-05 DIAGNOSIS — N6342 Unspecified lump in left breast, subareolar: Secondary | ICD-10-CM | POA: Insufficient documentation

## 2023-07-05 DIAGNOSIS — M1A2790 Drug-induced chronic gout, unspecified ankle and foot, without tophus (tophi): Secondary | ICD-10-CM | POA: Insufficient documentation

## 2023-07-05 DIAGNOSIS — G8929 Other chronic pain: Secondary | ICD-10-CM | POA: Insufficient documentation

## 2023-07-05 DIAGNOSIS — R7309 Other abnormal glucose: Secondary | ICD-10-CM | POA: Insufficient documentation

## 2023-07-05 NOTE — Assessment & Plan Note (Signed)
She has had previous studies including diagnostic mammo and u/s which revealed stable hamartoma.

## 2023-07-05 NOTE — Assessment & Plan Note (Signed)
Chronic, I will check uric acid level today. She will continue with allopurinol 100mg  daily. She is encouraged to avoid known triggers.

## 2023-07-05 NOTE — Assessment & Plan Note (Signed)
Previous labs reviewed, her A1c has been elevated in the past. I will check an A1c today. Reminded to avoid refined sugars including sugary drinks/foods and processed meats including bacon, sausages and deli meats.  

## 2023-07-05 NOTE — Assessment & Plan Note (Signed)
Chronic, fair control. Goal BP<120/80.  EKG performed, NSR w/o acute changes.  Encouraged to follow low sodium diet. She will continue with amlodipine 10mg  daily, hydralazine 10mg  three times daily. If elevated at next visit, will consider adding another agent. She will f/u in four to six months.

## 2023-07-05 NOTE — Assessment & Plan Note (Signed)
Urinalysis is suggestive of UTI, I will send off urine culture.

## 2023-07-05 NOTE — Assessment & Plan Note (Signed)
CCTA results reviewed. She is encouraged to follow a heart healthy lifestyle. She is encouraged to aim for at least 150 minutes of exercise/week.  Most recent Cardiology note reviewed.

## 2023-07-05 NOTE — Assessment & Plan Note (Signed)

## 2023-07-05 NOTE — Assessment & Plan Note (Signed)
LDL goal < 70. Encouraged to continue with rosuvastatin 20mg  daily.

## 2023-07-05 NOTE — Assessment & Plan Note (Signed)
She agrees to Ortho evaluation for radiographic studies and further evaluation.

## 2023-07-08 ENCOUNTER — Encounter: Payer: Self-pay | Admitting: *Deleted

## 2023-07-08 NOTE — Progress Notes (Signed)
[facility] PRE[facility] Weekly Session  Patient Details  Name: Kathryn Mendez MRN: [id number] Date of Birth: [birth year] Age: 72 y.o. PCP: Dorothyann Peng, MD  Vitals:   07/08/23 1330  Weight: 162 lb (73.5 kg)     [facility] Weekly seesion - 07/08/23 1500       [facility] "PREP" Location   [facility] "PREP" Location [facility]      Weekly Session   Topic Discussed Expectations and non-scale victories   Staying motivated, assessing progress toward goals, staying positive. Discussed Blue Zones and their commonalities in healthy living.   Minutes exercised this week 75 minutes    Classes attended to date 10             Remo Lipps 07/08/2023, 9:12 PM

## 2023-07-15 ENCOUNTER — Other Ambulatory Visit (INDEPENDENT_AMBULATORY_CARE_PROVIDER_SITE_OTHER): Payer: Medicare HMO

## 2023-07-15 ENCOUNTER — Ambulatory Visit: Payer: Medicare HMO | Admitting: Orthopaedic Surgery

## 2023-07-15 DIAGNOSIS — G8929 Other chronic pain: Secondary | ICD-10-CM

## 2023-07-15 DIAGNOSIS — M25512 Pain in left shoulder: Secondary | ICD-10-CM

## 2023-07-15 DIAGNOSIS — M25511 Pain in right shoulder: Secondary | ICD-10-CM | POA: Diagnosis not present

## 2023-07-15 MED ORDER — BUPIVACAINE HCL 0.5 % IJ SOLN
3.0000 mL | INTRAMUSCULAR | Status: AC | PRN
Start: 2023-07-15 — End: 2023-07-15
  Administered 2023-07-15: 3 mL via INTRA_ARTICULAR

## 2023-07-15 MED ORDER — LIDOCAINE HCL 1 % IJ SOLN
3.0000 mL | INTRAMUSCULAR | Status: AC | PRN
Start: 2023-07-15 — End: 2023-07-15
  Administered 2023-07-15: 3 mL

## 2023-07-15 MED ORDER — METHYLPREDNISOLONE ACETATE 40 MG/ML IJ SUSP
40.0000 mg | INTRAMUSCULAR | Status: AC | PRN
Start: 2023-07-15 — End: 2023-07-15
  Administered 2023-07-15: 40 mg via INTRA_ARTICULAR

## 2023-07-15 NOTE — Progress Notes (Signed)
Office Visit Note   Patient: Kathryn Mendez           Date of Birth: 1951-03-07           MRN: [id number] Visit Date: 07/15/2023              Requested by: Dorothyann Peng, MD [address] [place],  [place] [address] PCP: Dorothyann Peng, MD   Assessment & Plan: Visit Diagnoses:  1. Chronic right shoulder pain   2. Chronic left shoulder pain     Plan: Impression 72 year old female with bilateral shoulder pain mainly on the left.  Possible causes for pain discussed with the patient and she would like to try subacromial injection and I will make referral to outpatient physical therapy for both shoulders.  Follow-up as needed.  She tolerated the injection well today.  Follow-Up Instructions: No follow-ups on file.   Orders:  Orders Placed This Encounter  Procedures   XR Shoulder Right   XR Shoulder Left   Ambulatory referral to Physical Therapy   No orders of the defined types were placed in this encounter.     Procedures: Large Joint Inj: L subacromial bursa on 07/15/2023 9:01 AM Indications: pain Details: 22 G needle  Arthrogram: No  Medications: 3 mL lidocaine 1 %; 3 mL bupivacaine 0.5 %; 40 mg methylPREDNISolone acetate 40 MG/ML Outcome: tolerated well, no immediate complications Patient was prepped and draped in the usual sterile fashion.       Clinical Data: No additional findings.   Subjective: Chief Complaint  Patient presents with   Left Shoulder - Pain   Right Shoulder - Pain    HPI Kathryn Mendez is a 72 year old female here for evaluation of bilateral shoulder pain greater on the left since March.  She has pain when reaching behind or reaching up and lifting.  Denies any radicular symptoms.  She has been using lidocaine patches and Tylenol for the pain.  She reports pain around the shoulder that radiates down the lateral deltoid when she raises her arm. Review of Systems  Constitutional: Negative.   HENT: Negative.    Eyes: Negative.    Respiratory: Negative.    Cardiovascular: Negative.   Endocrine: Negative.   Musculoskeletal: Negative.   Neurological: Negative.   Hematological: Negative.   Psychiatric/Behavioral: Negative.    All other systems reviewed and are negative.    Objective: Vital Signs: There were no vitals taken for this visit.  Physical Exam Vitals and nursing note reviewed.  Constitutional:      Appearance: She is well-developed.  HENT:     Head: Atraumatic.     Nose: Nose normal.  Eyes:     Extraocular Movements: Extraocular movements intact.  Cardiovascular:     Pulses: Normal pulses.  Pulmonary:     Effort: Pulmonary effort is normal.  Abdominal:     Palpations: Abdomen is soft.  Musculoskeletal:     Cervical back: Neck supple.  Skin:    General: Skin is warm.     Capillary Refill: Capillary refill takes less than 2 seconds.  Neurological:     Mental Status: She is alert. Mental status is at baseline.  Psychiatric:        Behavior: Behavior normal.        Thought Content: Thought content normal.        Judgment: Judgment normal.     Ortho Exam Examination of bilateral shoulder shows pain with forward flexion past 160 degrees.  Rotator cuff testing  shows slight pain and weakness to manual muscle testing.  AC joint is nontender.  Slight impingement signs. Specialty Comments:  No specialty comments available.  Imaging: No results found.   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 07/05/2023   Leukocytes in urine 07/05/2023   Subareolar mass of left breast 07/05/2023   Annual physical exam 06/25/2023   Coronary artery calcification 05/08/2023   Pure hypercholesterolemia 05/08/2023   Body mass index (BMI) of 29.0 to 29.9 in adult 05/08/2023   Chronic nonintractable headache 05/08/2023   Unstable angina (HCC)  01/27/2023   Chest pain at rest 01/26/2023   Chest pain 01/24/2023   Chronic renal disease, stage II 12/14/2018   Snoring 12/14/2018   Muscle strain 11/04/2018   Hypertensive heart disease 09/10/2018   Mixed hyperlipidemia 09/10/2018   Allergic rhinitis    Uterine leiomyoma 08/19/2016   Hyperglycemia 02/11/2013   Past Medical History:  Diagnosis Date   High cholesterol    Hypertension     Family History  Problem Relation Age of Onset   Diabetes Mother    Hypertension Mother    Stroke Mother    Heart attack Father 31   Stroke Father    Diabetes Sister     Past Surgical History:  Procedure Laterality Date   cataract surgery Bilateral 2024   Dr. Nile Riggs   CHOLECYSTECTOMY     TUBAL LIGATION     Social History   Occupational History   Occupation: retired  Tobacco Use   Smoking status: Never   Smokeless tobacco: Never  Vaping Use   Vaping status: Never Used  Substance and Sexual Activity   Alcohol use: Not Currently    Comment: occasional   Drug use: No   Sexual activity: Yes

## 2023-07-16 ENCOUNTER — Encounter: Payer: Self-pay | Admitting: *Deleted

## 2023-07-16 NOTE — Progress Notes (Signed)
[facility] PREP Weekly Session  Patient Details  Name: Kathryn Mendez MRN: [id number] Date of Birth: Jun 30, 1951 Age: 72 y.o. PCP: Dorothyann Peng, MD  Vitals:   07/15/23 1330  Weight: 161 lb 3.2 oz (73.1 kg)     [facility] Weekly seesion - 07/15/23 1500       [facility] "PREP" Location   [facility] "PREP" Location [facility]      Weekly Session   Topic Discussed Other   Portion Control. Healthy eating plate, stratagies for controlling portion size, visual aids.   Minutes exercised this week 75 minutes    Classes attended to date 43             Remo Lipps 07/16/2023, 11:31 AM

## 2023-07-22 ENCOUNTER — Encounter: Payer: Self-pay | Admitting: *Deleted

## 2023-07-22 NOTE — Progress Notes (Signed)
[facility] PREP Weekly Session  Patient Details  Name: Kathryn Mendez MRN: [id number] Date of Birth: 1951/09/05 Age: 72 y.o. PCP: Dorothyann Peng, MD  Vitals:   07/22/23 1330  Weight: 157 lb 6.4 oz (71.4 kg)     [facility] Weekly seesion - 07/22/23 1500       [facility] "PREP" Location   [facility] "PREP" Location [facility]      Weekly Session   Topic Discussed Finding support   Accountability partners, recognizing non-supportive people, finding exercise that you enjoy and has variety, surrounding yourself with positive energy.   Minutes exercised this week 75 minutes    Classes attended to date 20             Remo Lipps 07/22/2023, 8:28 PM

## 2023-07-22 NOTE — Therapy (Signed)
OUTPATIENT PHYSICAL THERAY EVALUATION   Patient Name: Kathryn Mendez MRN: [id number] DOB:[birth year], 72 y.o., female Today's Date: 07/23/2023  END OF SESSION:  PT End of Session - 07/23/23 0828     Visit Number 1    Number of Visits 24    Date for PT Re-Evaluation 10/15/23    Authorization Type [employer] $25 copay    Authorization - Visit Number 1    PT Start Time 0837    PT Stop Time 0912    PT Time Calculation (min) 35 min    Activity Tolerance Patient tolerated treatment well    Behavior During Therapy [facility] for tasks assessed/performed             Past Medical History:  Diagnosis Date   High cholesterol    Hypertension    Past Surgical History:  Procedure Laterality Date   cataract surgery Bilateral 2024   Dr. 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 07/05/2023   Leukocytes in urine 07/05/2023   Subareolar mass of left breast 07/05/2023   Annual physical exam 06/25/2023   Coronary artery calcification 05/08/2023   Pure hypercholesterolemia 05/08/2023   Body mass index (BMI) of 29.0 to 29.9 in adult 05/08/2023   Chronic nonintractable headache 05/08/2023   Unstable angina (HCC) 01/27/2023   Chest pain at rest 01/26/2023   Chest pain 01/24/2023   Chronic renal disease, stage II 12/14/2018   Snoring 12/14/2018   Muscle strain 11/04/2018   Hypertensive heart disease 09/10/2018   Mixed hyperlipidemia 09/10/2018   Allergic rhinitis    Uterine leiomyoma 08/19/2016   Hyperglycemia 02/11/2013    PCP: Dorothyann Peng MD  REFERRING PROVIDER: Tarry Kos, MD  REFERRING DIAG: [phone] (ICD-10-CM) - Chronic right shoulder pain M25.512,G89.29 (ICD-10-CM) - Chronic left shoulder pain  THERAPY DIAG:  Chronic pain of both shoulders  Muscle weakness  (generalized)  Rationale for Evaluation and Treatment: Rehabilitation  ONSET DATE: January 24, 2023  SUBJECTIVE:                                                                                                                                                                                      SUBJECTIVE STATEMENT: Pt indicated complaints in bilateral shoulder but Lt worse than Rt.  Pt indicated she was on a stretcher and fell off.  Pt indicated having injection that seemed to help some.  Reported difficulty sleeping on shoulders, getting dressed, lifting, reaching.  Pt indicated intermittent worse days.    PERTINENT HISTORY: High cholesterol, Hypertension  PAIN:  NPRS scale: current 6/10, at worst in last few weeks 10/10 Pain location: bilateral shoulder Lt > Rt anterior/lateral Pain description: sharp at times, ache Aggravating factors: difficulty sleeping on shoulders, getting dressed, lifting, reaching.  Relieving factors: injection in Lt shoulder helped some, OTC medicine some  PRECAUTIONS: None  WEIGHT BEARING RESTRICTIONS: No  FALLS:  Has patient fallen in last 6 months? No  LIVING ENVIRONMENT: Lives in: House/apartment  OCCUPATION: Retired   PLOF: Independent, housework, light yardwork, walking.  Rt hand dominant.   PATIENT GOALS: Reduce pain, do normal daily stuff better.   OBJECTIVE:   PATIENT SURVEYS:  07/23/2023 FOTO intake:  4   predicted:  51  COGNITION: 07/23/2023 Overall cognitive status: WFL     SENSATION: 07/23/2023 WFL  POSTURE: 07/23/2023 Rounded shoulders, mild protracted scapulae.   UPPER EXTREMITY ROM:   ROM Right 07/23/2023 Left 07/23/2023  Shoulder flexion 140 AROM in supine  130 AROM in supine c end range pain  Shoulder extension    Shoulder abduction    Shoulder adduction    Shoulder internal rotation  40 in 45 deg abduction AROM supine  Shoulder external rotation  85 in 45 deg abduction AROM supine  Elbow flexion    Elbow  extension    Wrist flexion    Wrist extension            Hand behind back  T10 L1 c pain      (Blank rows = not tested)  UPPER EXTREMITY MMT:  MMT Right 07/23/2023 Left 07/23/2023  Shoulder flexion 4+/5 4/5 c pain  Shoulder extension    Shoulder abduction 4/5 4/5 c pain  Shoulder adduction    Shoulder internal rotation 5/5 5/5  Shoulder external rotation 4+/5 4/5 c pain  Middle trapezius    Lower trapezius    Elbow flexion    Elbow extension    Wrist flexion    Wrist extension    Wrist ulnar deviation    Wrist radial deviation    Wrist pronation    Wrist supination    Grip strength (lbs)    (Blank rows = not tested)  SHOULDER SPECIAL TESTS: 07/23/2023 bilateral (-) painful arc, drop arm   JOINT MOBILITY TESTING:  07/23/2023 No specific testing today  PALPATION:  07/23/2023 Mild tenderness to light touch around anterior, lateral, posterior shoulder Lt > Rt.  TODAY'S TREATMENT:                                                                                                       DATE: 07/23/2023 Therex:    HEP instruction/performance c cues for techniques, handout provided.  Trial set performed of each for comprehension and symptom assessment.  See below for exercise list   PATIENT EDUCATION: 07/23/2023 Education details: HEP, POC Person educated: Patient Education method: Explanation, Demonstration, Verbal cues, and Handouts Education comprehension: verbalized understanding, returned demonstration, and verbal cues required  HOME EXERCISE PROGRAM: Access Code: [email or web address] URL: [email or web address] Date: 07/23/2023 Prepared by: Chyrel Masson  Exercises - Seated Scapular Retraction  - 2-3 x daily - 7 x weekly - 1 sets - 10 reps - 3-5 hold - Standing Shoulder  Posterior Capsule Stretch  - 2-3 x daily - 7 x weekly - 1 sets - 3-5 reps - 15-30 hold - Shoulder External Rotation with Anchored Resistance  - 1-2 x daily - 7 x weekly - 2-3 sets - 10-15 reps - Standing Shoulder Row with Anchored Resistance  - 1-2 x daily - 7 x weekly - 2-3 sets - 10-15 reps - Shoulder Extension with Resistance  - 1-2 x daily - 7 x weekly - 2-3 sets - 10-15 reps  ASSESSMENT:  CLINICAL IMPRESSION: Patient is a 72 y.o. who comes to clinic with complaints of bilateral shoulder pain with mobility, strength and movement coordination deficits that impair their ability to perform usual daily and recreational functional activities without increase difficulty/symptoms at this time.  Patient to benefit from skilled PT services to address impairments and limitations to improve to previous level of function without restriction secondary to condition.   OBJECTIVE IMPAIRMENTS: decreased activity tolerance, decreased coordination, decreased endurance, decreased mobility, decreased ROM, decreased strength, hypomobility, increased fascial restrictions, impaired perceived functional ability, impaired flexibility, impaired UE functional use, improper body mechanics, postural dysfunction, and pain.   ACTIVITY LIMITATIONS: carrying, lifting, sleeping, bed mobility, bathing, toileting, dressing, reach over head, and hygiene/grooming  PARTICIPATION LIMITATIONS: meal prep, cleaning, laundry, interpersonal relationship, driving, shopping, community activity, and yard work  PERSONAL FACTORS: Time since onset of injury/illness/exacerbation are also affecting patient's functional outcome.   REHAB POTENTIAL: Good  CLINICAL DECISION MAKING: Stable/uncomplicated  EVALUATION COMPLEXITY: Low   GOALS: Goals reviewed with patient? Yes  SHORT TERM GOALS: (target date for Short term goals are 3 weeks 08/13/2023)  1.Patient will demonstrate independent use of home exercise program to maintain progress from  in clinic treatments. Goal status: New  LONG TERM GOALS: (target dates for all long term goals are 12 weeks  10/15/2023 )   1. Patient will demonstrate/report pain at worst less than or equal to 2/10 to facilitate minimal limitation in daily activity secondary to pain symptoms. Goal status: New   2. Patient will demonstrate independent use of home exercise program to facilitate ability to maintain/progress functional gains from skilled physical therapy services. Goal status: New   3. Patient will demonstrate FOTO outcome > or = 51 % to indicate reduced disability due to condition. Goal status:  New   4.  Patient will demonstrate bilateral  UE MMT 5/5 throughout to facilitate lifting, reaching, carrying at [facility] in daily activity.   Goal status: New   5.  Patient will demonstrate bilateral GH joint AROM WFL s symptoms to facilitate usual overhead reaching, self care, dressing at PLOF.    Goal status: New   6.  Patient will demonstrate ability to sleep s symptoms disruption.  Goal status: New    PLAN:  PT FREQUENCY: 1-2x/week  PT DURATION: 12 weeks  PLANNED INTERVENTIONS: Therapeutic exercises, Therapeutic activity, Neuro Muscular re-education, Balance training, Gait training, Patient/Family education, Joint mobilization, Stair training, DME instructions, Dry Needling, Electrical stimulation, Traction, Cryotherapy, vasopneumatic device Moist heat, Taping, Ultrasound, Ionotophoresis 4mg /ml Dexamethasone, and aquatic therapy ,Manual therapy.  All included unless contraindicated  PLAN FOR NEXT SESSION: Review HEP knowledge/results.  Progressive shoulder strengthening, posterior capsule stretching.    Chyrel Masson, PT, DPT, OCS, ATC 07/23/23  9:16 AM   Referring diagnosis? M25.511,G89.29 (ICD-10-CM) - Chronic right shoulder pain M25.512,G89.29 (ICD-10-CM) - Chronic left shoulder pain Treatment diagnosis? (if different than referring diagnosis) M25.511, M25.512 What was this  (referring dx) caused by? []  Surgery []  Fall [x]  Ongoing issue []  Arthritis []  Other: ____________  Laterality: []  Rt []  Lt [x]  Both  Check all possible CPT codes:  *CHOOSE 10 OR LESS*    []  97110 (Therapeutic Exercise)  []  92507 (SLP Treatment)  []  91478 (Neuro Re-ed)   []  92526 (Swallowing Treatment)   []  97116 (Gait Training)   []  29562 (Cognitive Training, 1st 15 minutes) []  97140 (Manual Therapy)   []  97130 (Cognitive Training, each add'l 15 minutes)  []  97164 (Re-evaluation)                              []  Other, List CPT Code ____________  []  97530 (Therapeutic Activities)     []  97535 (Self Care)   [x]  All codes above (97110 - 97535)  []  97012 (Mechanical Traction)  [x]  97014 (E-stim Unattended)  []  97032 (E-stim manual)  []  97033 (Ionto)  []  97035 (Ultrasound) [x]  97750 (Physical Performance Training) []  [id number] (Aquatic Therapy) [x]  97016 (Vasopneumatic Device) []  [id number] (Paraffin) []  97034 (Contrast Bath) []  97597 (Wound Care 1st 20 sq cm) []  97598 (Wound Care each add'l 20 sq cm) []  97760 (Orthotic Fabrication, Fitting, Training Initial) []  [id number] (Prosthetic Management and Training Initial) []  [id number] (Orthotic or Prosthetic Training/ Modification Subsequent)

## 2023-07-23 ENCOUNTER — Other Ambulatory Visit: Payer: Self-pay

## 2023-07-23 ENCOUNTER — Ambulatory Visit: Payer: Medicare HMO | Admitting: Rehabilitative and Restorative Service Providers"

## 2023-07-23 ENCOUNTER — Encounter: Payer: Self-pay | Admitting: Rehabilitative and Restorative Service Providers"

## 2023-07-23 ENCOUNTER — Other Ambulatory Visit: Payer: Self-pay | Admitting: Nurse Practitioner

## 2023-07-23 ENCOUNTER — Other Ambulatory Visit (HOSPITAL_COMMUNITY): Payer: Self-pay

## 2023-07-23 DIAGNOSIS — M6281 Muscle weakness (generalized): Secondary | ICD-10-CM | POA: Diagnosis not present

## 2023-07-23 DIAGNOSIS — M25511 Pain in right shoulder: Secondary | ICD-10-CM | POA: Diagnosis not present

## 2023-07-23 DIAGNOSIS — G8929 Other chronic pain: Secondary | ICD-10-CM

## 2023-07-23 DIAGNOSIS — M25512 Pain in left shoulder: Secondary | ICD-10-CM

## 2023-07-29 ENCOUNTER — Encounter: Payer: Self-pay | Admitting: *Deleted

## 2023-07-29 NOTE — Progress Notes (Signed)
[employer] PREP Weekly Session  Patient Details  Name: Kathryn Mendez MRN: [id number] Date of Birth: 1951-04-11 Age: 72 y.o. PCP: Dorothyann Peng, MD  Vitals:   07/29/23 1330  Weight: 161 lb 6.4 oz (73.2 kg)     [facility] Weekly seesion - 07/29/23 1500       [facility] "PREP" Location   [facility] "PREP" Location [facility]      Weekly Session   Topic Discussed Calorie breakdown   Review macronutrients, carbohydrates, fats and proteins. Quality nutrition and cooking at home. All calories are not the same. Protein rich foods.   Minutes exercised this week 75 minutes    Classes attended to date 17             Remo Lipps 07/29/2023, 5:40 PM

## 2023-08-05 ENCOUNTER — Encounter: Payer: Self-pay | Admitting: *Deleted

## 2023-08-05 NOTE — Progress Notes (Signed)
[facility] PREP Weekly Session  Patient Details  Name: Kathryn Mendez MRN: [id number] Date of Birth: 1951-06-26 Age: 72 y.o. PCP: Dorothyann Peng, MD  Vitals:   08/05/23 1330  Weight: 159 lb (72.1 kg)     [facility] Weekly seesion - 08/05/23 1500       [facility] "PREP" Location   [facility] "PREP" Location [facility]      Weekly Session   Topic Discussed Hitting roadblocks   Review metrics. Discussed common obstacles to success and stratagies for moving forward. [employer] membership talk by [employer] conservator/restorer [place]).   Minutes exercised this week 75 minutes    Classes attended to date 44             Remo Lipps 08/05/2023, 6:15 PM

## 2023-08-06 ENCOUNTER — Ambulatory Visit: Payer: Medicare HMO | Admitting: Rehabilitative and Restorative Service Providers"

## 2023-08-06 ENCOUNTER — Encounter: Payer: Self-pay | Admitting: Rehabilitative and Restorative Service Providers"

## 2023-08-06 DIAGNOSIS — M25512 Pain in left shoulder: Secondary | ICD-10-CM

## 2023-08-06 DIAGNOSIS — M6281 Muscle weakness (generalized): Secondary | ICD-10-CM | POA: Diagnosis not present

## 2023-08-06 DIAGNOSIS — G8929 Other chronic pain: Secondary | ICD-10-CM | POA: Diagnosis not present

## 2023-08-06 DIAGNOSIS — M25511 Pain in right shoulder: Secondary | ICD-10-CM

## 2023-08-06 NOTE — Therapy (Addendum)
OUTPATIENT PHYSICAL THERAPY TREATMENT  / DISCHARGE   Patient Name: Kathryn Mendez MRN: [id number] DOB:10-Jan-1951, 72 y.o., female Today's Date: 08/06/2023  END OF SESSION:  PT End of Session - 08/06/23 0912     Visit Number 2    Number of Visits 24    Date for PT Re-Evaluation 10/15/23    Authorization Type [employer] $25 copay    Authorization Time Period 07/23/2023- 1218/2024    Authorization - Visit Number 2    Authorization - Number of Visits 12    PT Start Time 0921    PT Stop Time 1000    PT Time Calculation (min) 39 min    Activity Tolerance Patient tolerated treatment well    Behavior During Therapy [facility] for tasks assessed/performed              Past Medical History:  Diagnosis Date   High cholesterol    Hypertension    Past Surgical History:  Procedure Laterality Date   cataract surgery Bilateral 2024   Dr. 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 07/05/2023   Leukocytes in urine 07/05/2023   Subareolar mass of left breast 07/05/2023   Annual physical exam 06/25/2023   Coronary artery calcification 05/08/2023   Pure hypercholesterolemia 05/08/2023   Body mass index (BMI) of 29.0 to 29.9 in adult 05/08/2023   Chronic nonintractable headache 05/08/2023   Unstable angina (HCC) 01/27/2023   Chest pain at rest 01/26/2023   Chest pain 01/24/2023   Chronic renal disease, stage II 12/14/2018   Snoring 12/14/2018   Muscle strain 11/04/2018   Hypertensive heart disease 09/10/2018   Mixed hyperlipidemia 09/10/2018   Allergic rhinitis    Uterine leiomyoma 08/19/2016   Hyperglycemia 02/11/2013    PCP: Dorothyann Peng MD  REFERRING PROVIDER: Tarry Kos, MD  REFERRING DIAG: [id number] (ICD-10-CM) - Chronic right shoulder pain M25.512,G89.29 (ICD-10-CM)  - Chronic left shoulder pain  THERAPY DIAG:  Chronic pain of both shoulders  Muscle weakness (generalized)  Rationale for Evaluation and Treatment: Rehabilitation  ONSET DATE: January 24, 2023  SUBJECTIVE:                                                                                                                                                                                      SUBJECTIVE STATEMENT: Pt indicated in general having a little improvement since eval.  Reported pain at worst 5/10 range in last few days for  shoulders.  Sleeping on Lt side still troublesome.   PERTINENT HISTORY: High cholesterol, Hypertension  PAIN:  NPRS scale: at worst last few days 5/10.  Pain location: bilateral shoulder Lt > Rt anterior/lateral Pain description: sharp at times, ache Aggravating factors: difficulty sleeping on shoulders, getting dressed, lifting, reaching.  Relieving factors: injection in Lt shoulder helped some, OTC medicine some  PRECAUTIONS: None  WEIGHT BEARING RESTRICTIONS: No  FALLS:  Has patient fallen in last 6 months? No  LIVING ENVIRONMENT: Lives in: House/apartment  OCCUPATION: Retired   PLOF: Independent, housework, light yardwork, walking.  Rt hand dominant.   PATIENT GOALS: Reduce pain, do normal daily stuff better.   OBJECTIVE:   PATIENT SURVEYS:  07/23/2023 FOTO intake:  4   predicted:  51  COGNITION: 07/23/2023 Overall cognitive status: WFL     SENSATION: 07/23/2023 WFL  POSTURE: 07/23/2023 Rounded shoulders, mild protracted scapulae.   UPPER EXTREMITY ROM:  08/06/2023:  Elevation against gravity to approx. 135 deg with Lt c end range symptoms.  No complaints on Rt.    ROM Right 07/23/2023 Left 07/23/2023  Shoulder flexion 140 AROM in supine  130 AROM in supine c end range pain  Shoulder extension    Shoulder abduction    Shoulder adduction    Shoulder internal rotation  40 in 45 deg abduction AROM supine  Shoulder external rotation   85 in 45 deg abduction AROM supine  Elbow flexion    Elbow extension    Wrist flexion    Wrist extension            Hand behind back  T10 L1 c pain      (Blank rows = not tested)  UPPER EXTREMITY MMT:  MMT Right 07/23/2023 Left 07/23/2023  Shoulder flexion 4+/5 4/5 c pain  Shoulder extension    Shoulder abduction 4/5 4/5 c pain  Shoulder adduction    Shoulder internal rotation 5/5 5/5  Shoulder external rotation 4+/5 4/5 c pain  Middle trapezius    Lower trapezius    Elbow flexion    Elbow extension    Wrist flexion    Wrist extension    Wrist ulnar deviation    Wrist radial deviation    Wrist pronation    Wrist supination    Grip strength (lbs)    (Blank rows = not tested)  SHOULDER SPECIAL TESTS: 07/23/2023 bilateral (-) painful arc, drop arm   JOINT MOBILITY TESTING:  07/23/2023 No specific testing today  PALPATION:  07/23/2023 Mild tenderness to light touch around anterior, lateral, posterior shoulder Lt > Rt.  TODAY'S TREATMENT:                                                                                                       DATE: 08/06/2023 Therex: UBE fwd/back 4 mins forward, 3 mins backward with 30 sec rest break between lvl 2.0 Tband rows seated green band 2x15 bilaterally Tband green gh ext bilaterally 2x15 Tband ER c towel under arm 2 x 10 , performed bilaterally Tband IR c towel under am 2 x 10 , performed bilaterally   TODAY'S TREATMENT:                                                                                                       DATE: 07/23/2023 Therex:    HEP instruction/performance c cues for techniques, handout provided.  Trial set performed of each for comprehension and symptom assessment.  See below for exercise list   PATIENT  EDUCATION: 07/23/2023 Education details: HEP, POC Person educated: Patient Education method: Explanation, Demonstration, Verbal cues, and Handouts Education comprehension: verbalized understanding, returned demonstration, and verbal cues required  HOME EXERCISE PROGRAM: Access Code: [email or web address] URL: [email or web address] Date: 07/23/2023 Prepared by: Chyrel Masson  Exercises - Seated Scapular Retraction  - 2-3 x daily - 7 x weekly - 1 sets - 10 reps - 3-5 hold - Standing Shoulder Posterior Capsule Stretch  - 2-3 x daily - 7 x weekly - 1 sets - 3-5 reps - 15-30 hold - Shoulder External Rotation with Anchored Resistance  - 1-2 x daily - 7 x weekly - 2-3 sets - 10-15 reps - Standing Shoulder Row with Anchored Resistance  - 1-2 x daily - 7 x weekly - 2-3 sets - 10-15 reps - Shoulder Extension with Resistance  - 1-2 x daily - 7 x weekly - 2-3 sets - 10-15 reps  ASSESSMENT:  CLINICAL IMPRESSION: Presentation showed improvement in quality of elevation with mild end range symptoms noted on Lt.  Pt to benefit from continued strengthening program to improve functional movement strength for daily activity.  Continued skilled PT services indicated at this time.   OBJECTIVE IMPAIRMENTS: decreased activity tolerance, decreased coordination, decreased endurance, decreased mobility, decreased ROM, decreased strength, hypomobility, increased fascial restrictions, impaired perceived functional ability, impaired flexibility, impaired UE functional use, improper body mechanics, postural dysfunction, and pain.   ACTIVITY LIMITATIONS: carrying, lifting, sleeping, bed mobility, bathing, toileting, dressing, reach over head, and hygiene/grooming  PARTICIPATION LIMITATIONS: meal prep, cleaning, laundry, interpersonal relationship, driving, shopping, community activity, and yard work  PERSONAL FACTORS: Time since onset of injury/illness/exacerbation are also affecting patient's functional outcome.    REHAB POTENTIAL: Good  CLINICAL DECISION MAKING: Stable/uncomplicated  EVALUATION COMPLEXITY: Low  GOALS: Goals reviewed with patient? Yes  SHORT TERM GOALS: (target date for Short term goals are 3 weeks 08/13/2023)  1.Patient will demonstrate independent use of home exercise program to maintain progress from in clinic treatments. Goal status: New  LONG TERM GOALS: (target dates for all long term goals are 12 weeks  10/15/2023 )   1. Patient will demonstrate/report pain at worst less than or equal to 2/10 to facilitate minimal limitation in daily activity secondary to pain symptoms. Goal status: New   2. Patient will demonstrate independent use of home exercise program to facilitate ability to maintain/progress functional gains from skilled physical therapy services. Goal status: New   3. Patient will demonstrate FOTO outcome > or = 51 % to indicate reduced disability due to condition. Goal status: New   4.  Patient will demonstrate bilateral  UE MMT 5/5 throughout to facilitate lifting, reaching, carrying at [facility] in daily activity.   Goal status: New   5.  Patient will demonstrate bilateral GH joint AROM WFL s symptoms to facilitate usual overhead reaching, self care, dressing at PLOF.    Goal status: New   6.  Patient will demonstrate ability to sleep s symptoms disruption.  Goal status: New    PLAN:  PT FREQUENCY: 1-2x/week  PT DURATION: 12 weeks  PLANNED INTERVENTIONS: Therapeutic exercises, Therapeutic activity, Neuro Muscular re-education, Balance training, Gait training, Patient/Family education, Joint mobilization, Stair training, DME instructions, Dry Needling, Electrical stimulation, Traction, Cryotherapy, vasopneumatic device Moist heat, Taping, Ultrasound, Ionotophoresis 4mg /ml Dexamethasone, and aquatic therapy ,Manual therapy.  All included unless contraindicated  PLAN FOR NEXT SESSION:   Retest strength.    Chyrel Masson, PT, DPT, OCS,  ATC 08/06/23  10:02 AM   PHYSICAL THERAPY DISCHARGE SUMMARY  Visits from Start of Care: 2  Current functional level related to goals / functional outcomes: See note   Remaining deficits: See note   Education / Equipment: HEP  Patient goals were partially met. Patient is being discharged due to not returning since the last visit.  Chyrel Masson, PT, DPT, OCS, ATC 09/08/23  1:16 PM

## 2023-08-14 ENCOUNTER — Encounter: Payer: Self-pay | Admitting: *Deleted

## 2023-08-14 NOTE — Progress Notes (Signed)
[facility] PREP Evaluation  Patient Details  Name: Kathryn Mendez MRN: [id number] Date of Birth: [birth year] Age: 72 y.o. PCP: Dorothyann Peng, MD  Vitals:   08/14/23 1100  BP: (!) 160/74  Pulse: 79  Resp: 20  SpO2: 97%  Weight: 159 lb 12.8 oz (72.5 kg)     [facility] Eval - 08/14/23 1145       [facility] "PREP" Location   [facility] "PREP" Location [facility]      Referral    Referring Provider Sanders    Reason for referral High Cholesterol;Hypertension;Obesitity/Overweight;Inactivity    Program Start Date 05/27/23    Program End Date 08/14/23      Measurement   Waist Circumference 40 inches    Waist Circumference End Program 37 inches    Hip Circumference 41.75 inches    Hip Circumference End Program 39.75 inches    Body fat 44.6 percent      Mobility and Daily Activities   I find it easy to walk up or down two or more flights of stairs. 3    I have no trouble taking out the trash. 3    I do housework such as vacuuming and dusting on my own without difficulty. 4    I can easily lift a gallon of milk (8lbs). 3    I can easily walk a mile. 2    I have no trouble reaching into high cupboards or reaching down to pick up something from the floor. 3    I do not have trouble doing out-door work such as Loss adjuster, chartered, raking leaves, or gardening. 3      Mobility and Daily Activities   I feel younger than my age. 3    I feel independent. 4    I feel energetic. 3    I live an active life.  4    I feel strong. 3    I feel healthy. 3    I feel active as other people my age. 3      How fit and strong are you.   Fit and Strong Total Score 44            Past Medical History:  Diagnosis Date   High cholesterol    Hypertension    Past Surgical History:  Procedure Laterality Date   cataract surgery Bilateral 2024   Dr. Nile Riggs   CHOLECYSTECTOMY     TUBAL LIGATION     Social History   Tobacco Use  Smoking Status Never  Smokeless Tobacco Never    Kathryn Mendez 08/14/2023, 3:02 PM  PREP Complete. Attended 12 of 12 education classes and 9 of 10 exercise sessions. Kathryn Mendez was very engaged in class and added to the positive group camaraderie. Improvement noted in FIT testing scores as well as a 6 point improvement on Fit to [employer]. Weight loss 6.2lbs. Waist/hip ratio decreased 3"/2" from the start of the program. Kathryn Mendez has a solid plan in place for continuing her journey towards improved health. Great work Garment/textile technologist!

## 2023-09-06 ENCOUNTER — Other Ambulatory Visit: Payer: Self-pay | Admitting: Internal Medicine

## 2023-10-15 ENCOUNTER — Ambulatory Visit: Payer: Medicare HMO | Admitting: Family Medicine

## 2023-10-15 ENCOUNTER — Encounter: Payer: Self-pay | Admitting: Family Medicine

## 2023-10-15 VITALS — BP 120/76 | HR 80 | Temp 98.1°F | Ht 62.0 in | Wt 156.0 lb

## 2023-10-15 DIAGNOSIS — M545 Low back pain, unspecified: Secondary | ICD-10-CM

## 2023-10-15 DIAGNOSIS — E782 Mixed hyperlipidemia: Secondary | ICD-10-CM

## 2023-10-15 DIAGNOSIS — I119 Hypertensive heart disease without heart failure: Secondary | ICD-10-CM

## 2023-10-15 DIAGNOSIS — M79604 Pain in right leg: Secondary | ICD-10-CM | POA: Diagnosis not present

## 2023-10-15 MED ORDER — TRIAMCINOLONE ACETONIDE 40 MG/ML IJ SUSP
60.0000 mg | Freq: Once | INTRAMUSCULAR | Status: AC
Start: 2023-10-15 — End: 2023-10-15
  Administered 2023-10-15: 60 mg via INTRAMUSCULAR

## 2023-10-15 MED ORDER — PREDNISONE 5 MG PO TABS: ORAL_TABLET | ORAL | 0 refills | Status: AC

## 2023-10-15 MED ORDER — ROSUVASTATIN CALCIUM 20 MG PO TABS: 20.0000 mg | ORAL_TABLET | Freq: Every day | ORAL | 3 refills | Status: DC

## 2023-10-15 MED ORDER — AMLODIPINE BESYLATE 10 MG PO TABS: 10.0000 mg | ORAL_TABLET | Freq: Every day | ORAL | 3 refills | Status: DC

## 2023-10-15 MED ORDER — MELOXICAM 15 MG PO TBDP: 15.0000 mg | ORAL_TABLET | Freq: Every day | ORAL | 0 refills | Status: DC | PRN

## 2023-10-15 NOTE — Progress Notes (Signed)
I,Jameka J Llittleton, CMA,acting as a Neurosurgeon for [employer], NP.,have documented all relevant documentation on the behalf of Ellender Hose, NP,as directed by  Ellender Hose, NP while in the presence of Ellender Hose, NP.  Subjective:  Patient ID: Kathryn Mendez , female    DOB: [birth year] , 72 y.o.   MRN: [id number]  Chief Complaint  Patient presents with   Back Pain    HPI  Patient is a 72 year old female that presents today for back pain that started on Sunday.  Patient states that the pain hits worse when she wakes up in the morning with pain in her lower back that radiates down to her right leg.She rates the pain 8/10. Patient states that she took some Tylenol and that gave her some relief but pain had been persistent. She denies any weakness, numbness or loss of bladder or bowel control since pain started.  Patient has a previous diagnosis of hypertension and hyperlipidemia.BP today is within normal limits, will continue current treatment Amlodipine 10 mg every day, hydralazine 10 mg TID and Crestor 20 mg every day. Patient has no other questions or concerns.     Past Medical History:  Diagnosis Date   High cholesterol    Hypertension      Family History  Problem Relation Age of Onset   Diabetes Mother    Hypertension Mother    Stroke Mother    Heart attack Father 59   Stroke Father    Diabetes Sister      Current Outpatient Medications:    albuterol (VENTOLIN HFA) 108 (90 Base) MCG/ACT inhaler, Inhale 2 puffs into the lungs every 4 (four) hours as needed for wheezing or shortness of breath., Disp: 18 g, Rfl: 3   allopurinol (ZYLOPRIM) 100 MG tablet, TAKE 1 TABLET(100 MG) BY MOUTH DAILY, Disp: 90 tablet, Rfl: 2   hydrALAZINE (APRESOLINE) 10 MG tablet, TAKE 1 TABLET BY MOUTH three times daily, Disp: 270 tablet, Rfl: 1   Meloxicam 15 MG TBDP, Take 15 mg by mouth daily as needed., Disp: 30 tablet, Rfl: 0   nitroGLYCERIN (NITROSTAT) 0.4 MG SL tablet, Place 1 tablet (0.4 mg  total) under the tongue every 5 (five) minutes x 3 doses as needed for chest pain., Disp: 25 tablet, Rfl: 2   predniSONE (DELTASONE) 5 MG tablet, Use as directed, Disp: 21 tablet, Rfl: 0   amLODipine (NORVASC) 10 MG tablet, Take 1 tablet (10 mg total) by mouth daily., Disp: 90 tablet, Rfl: 3   rosuvastatin (CRESTOR) 20 MG tablet, Take 1 tablet (20 mg total) by mouth daily., Disp: 90 tablet, Rfl: 3   Allergies  Allergen Reactions   Valsartan Anaphylaxis    angioedema     Review of Systems  Constitutional: Negative.   Respiratory: Negative.    Cardiovascular: Negative.  Negative for chest pain, palpitations and leg swelling.  Musculoskeletal:  Positive for back pain and gait problem. Negative for joint swelling.  Psychiatric/Behavioral: Negative.       Today's Vitals   10/15/23 1452  BP: 120/76  Pulse: 80  Temp: 98.1 F (36.7 C)  TempSrc: Oral  Weight: 156 lb (70.8 kg)  Height: 5\' 2"  (1.575 m)  PainSc: 8   PainLoc: Back   Body mass index is 28.53 kg/m.  Wt Readings from Last 3 Encounters:  10/15/23 156 lb (70.8 kg)  08/14/23 159 lb 12.8 oz (72.5 kg)  08/05/23 159 lb (72.1 kg)    The 10-year ASCVD risk score (Arnett DK,  et al., 2019) is: 12.4%   Values used to calculate the score:     Age: 89 years     Sex: Female     Is Non-Hispanic African American: Yes     Diabetic: No     Tobacco smoker: No     Systolic Blood Pressure: 120 mmHg     Is BP treated: Yes     HDL Cholesterol: 97 mg/dL     Total Cholesterol: 184 mg/dL  Objective:  Physical Exam Cardiovascular:     Rate and Rhythm: Normal rate.  Pulmonary:     Effort: Pulmonary effort is normal.  Musculoskeletal:        General: Tenderness present. No swelling.     Cervical back: Normal range of motion.  Skin:    General: Skin is warm and dry.  Neurological:     Mental Status: She is alert and oriented to person, place, and time.  Psychiatric:        Mood and Affect: Mood normal.        Behavior: Behavior  normal.         Assessment And Plan:  Lumbar pain with radiation down right leg Assessment & Plan: Take all meds as prescribed with FOOD.  Orders: -     Triamcinolone Acetonide -     predniSONE; Use as directed  Dispense: 21 tablet; Refill: 0 -     Meloxicam; Take 15 mg by mouth daily as needed.  Dispense: 30 tablet; Refill: 0  Mixed hyperlipidemia Assessment & Plan: Low fat diet advised. Continue crestor 20 mg every day.  Orders: -     Rosuvastatin Calcium; Take 1 tablet (20 mg total) by mouth daily.  Dispense: 90 tablet; Refill: 3  Hypertensive heart disease without heart failure Assessment & Plan: BP WNL, continue current tx.  Orders: -     amLODIPine Besylate; Take 1 tablet (10 mg total) by mouth daily.  Dispense: 90 tablet; Refill: 3    Return if symptoms worsen or fail to improve, for Keep next Appt.  Patient was given opportunity to ask questions. Patient verbalized understanding of the plan and was able to repeat key elements of the plan. All questions were answered to their satisfaction.    I, Ellender Hose, NP, have reviewed all documentation for this visit. The documentation on 10/25/2023 for the exam, diagnosis, procedures, and orders are all accurate and complete.    IF YOU HAVE BEEN REFERRED TO A SPECIALIST, IT MAY TAKE 1-2 WEEKS TO SCHEDULE/PROCESS THE REFERRAL. IF YOU HAVE NOT HEARD FROM US/SPECIALIST IN TWO WEEKS, PLEASE GIVE [place] A CALL AT [phone].

## 2023-10-26 DIAGNOSIS — M79604 Pain in right leg: Secondary | ICD-10-CM | POA: Insufficient documentation

## 2023-10-26 NOTE — Assessment & Plan Note (Signed)
Low fat diet advised. Continue crestor 20 mg every day.

## 2023-10-26 NOTE — Assessment & Plan Note (Signed)
Take all meds as prescribed with FOOD.

## 2023-10-26 NOTE — Assessment & Plan Note (Signed)
BP WNL, continue current tx.

## 2023-11-12 ENCOUNTER — Other Ambulatory Visit: Payer: Self-pay | Admitting: Family Medicine

## 2023-11-12 ENCOUNTER — Ambulatory Visit: Payer: Medicare HMO

## 2023-11-12 VITALS — BP 150/80 | HR 91 | Temp 97.4°F | Ht 61.6 in | Wt 156.8 lb

## 2023-11-12 DIAGNOSIS — Z0000 Encounter for general adult medical examination without abnormal findings: Secondary | ICD-10-CM

## 2023-11-12 DIAGNOSIS — Z23 Encounter for immunization: Secondary | ICD-10-CM | POA: Diagnosis not present

## 2023-11-12 DIAGNOSIS — M545 Low back pain, unspecified: Secondary | ICD-10-CM

## 2023-11-12 NOTE — Progress Notes (Signed)
 Subjective:   Kathryn Mendez is a 73 y.o. female who presents for Medicare Annual (Subsequent) preventive examination.  Visit Complete: In person    Cardiac Risk Factors include: advanced age (>72men, >64 women);dyslipidemia;hypertension     Objective:    Today's Vitals   11/12/23 1134 11/12/23 1149  BP: (!) 160/80 (!) 150/80  Pulse: 91   Temp: (!) 97.4 F (36.3 C)   TempSrc: Oral   SpO2: 96%   Weight: 156 lb 12.8 oz (71.1 kg)   Height: 5' 1.6" (1.565 m)    Body mass index is 29.05 kg/m.     11/12/2023   11:40 AM 07/23/2023    8:39 AM 01/24/2023   11:57 PM 01/24/2023    1:09 PM 10/16/2022    3:15 PM 09/27/2021   11:06 AM 09/20/2020   12:37 PM  Advanced Directives  Does Patient Have a Medical Advance Directive? No No  No No No No  Would patient like information on creating a medical advance directive? No - Patient declined No - Patient declined Yes (Inpatient - patient defers creating a medical advance directive at this time - Information given)  No - Patient declined  No - Patient declined    Current Medications (verified) Outpatient Encounter Medications as of 11/12/2023  Medication Sig   albuterol  (VENTOLIN  HFA) 108 (90 Base) MCG/ACT inhaler Inhale 2 puffs into the lungs every 4 (four) hours as needed for wheezing or shortness of breath.   allopurinol  (ZYLOPRIM ) 100 MG tablet TAKE 1 TABLET(100 MG) BY MOUTH DAILY   amLODipine  (NORVASC ) 10 MG tablet Take 1 tablet (10 mg total) by mouth daily.   hydrALAZINE  (APRESOLINE ) 10 MG tablet TAKE 1 TABLET BY MOUTH three times daily   meloxicam  (MOBIC ) 15 MG tablet TAKE 1 TABLET BY MOUTH DAILY AS NEEDED   nitroGLYCERIN  (NITROSTAT ) 0.4 MG SL tablet Place 1 tablet (0.4 mg total) under the tongue every 5 (five) minutes x 3 doses as needed for chest pain.   rosuvastatin  (CRESTOR ) 20 MG tablet Take 1 tablet (20 mg total) by mouth daily.   predniSONE  (DELTASONE ) 5 MG tablet Use as directed (Patient not taking: Reported on  11/12/2023)   No facility-administered encounter medications on file as of 11/12/2023.    Allergies (verified) Valsartan   History: Past Medical History:  Diagnosis Date   High cholesterol    Hypertension    Past Surgical History:  Procedure Laterality Date   cataract surgery Bilateral 2024   Dr. Gennie Kicks   CHOLECYSTECTOMY     TUBAL LIGATION     Family History  Problem Relation Age of Onset   Diabetes Mother    Hypertension Mother    Stroke Mother    Heart attack Father 37   Stroke Father    Diabetes Sister    Social History   Socioeconomic History   Marital status: Married    Spouse name: Not on file   Number of children: Not on file   Years of education: Not on file   Highest education level: Not on file  Occupational History   Occupation: retired  Tobacco Use   Smoking status: Never   Smokeless tobacco: Never  Vaping Use   Vaping status: Never Used  Substance and Sexual Activity   Alcohol use: Not Currently    Comment: occasional   Drug use: No   Sexual activity: Yes  Other Topics Concern   Not on file  Social History Narrative   Grandson lives with her.  Two  children.  Four grands.     Social Drivers of Corporate investment banker Strain: Low Risk  (11/12/2023)   Overall Financial Resource Strain (CARDIA)    Difficulty of Paying Living Expenses: Not hard at all  Food Insecurity: Food Insecurity Present (11/12/2023)   Hunger Vital Sign    Worried About Running Out of Food in the Last Year: Sometimes true    Ran Out of Food in the Last Year: Sometimes true  Transportation Needs: No Transportation Needs (11/12/2023)   PRAPARE - Administrator, Civil Service (Medical): No    Lack of Transportation (Non-Medical): No  Physical Activity: Insufficiently Active (11/12/2023)   Exercise Vital Sign    Days of Exercise per Week: 3 days    Minutes of Exercise per Session: 30 min  Stress: No Stress Concern Present (11/12/2023)   [employer] of  Occupational Health - Occupational Stress Questionnaire    Feeling of Stress : Not at all  Social Connections: Moderately Integrated (11/12/2023)   Social Connection and Isolation Panel [NHANES]    Frequency of Communication with Friends and Family: More than three times a week    Frequency of Social Gatherings with Friends and Family: Twice a week    Attends Religious Services: More than 4 times per year    Active Member of [employer] or Organizations: No    Attends Engineer, structural: Never    Marital Status: Married    Tobacco Counseling Counseling given: Not Answered   Clinical Intake:  Pre-visit preparation completed: Yes  Pain : No/denies pain     Nutritional Status: BMI 25 -29 Overweight Nutritional Risks: None Diabetes: No  How often do you need to have someone help you when you read instructions, pamphlets, or other written materials from your doctor or pharmacy?: 1 - Never  Interpreter Needed?: No  Information entered by :: NAllen LPN   Activities of Daily Living    11/12/2023   11:35 AM 01/24/2023    7:22 PM  In your present state of health, do you have any difficulty performing the following activities:  Hearing? 0 0  Vision? 0 0  Difficulty concentrating or making decisions? 0 0  Walking or climbing stairs? 0 0  Dressing or bathing? 0 0  Doing errands, shopping? 0 0  Preparing Food and eating ? N   Using the Toilet? N   In the past six months, have you accidently leaked urine? N   Do you have problems with loss of bowel control? N   Managing your Medications? N   Managing your Finances? N   Housekeeping or managing your Housekeeping? N     Patient Care Team: Cleave Curling, MD as PCP - General (Internal Medicine) Eilleen Grates, MD as PCP - Cardiology (Cardiology) Albert Huff, MD as Consulting Physician (Ophthalmology)  Indicate any recent Medical Services you may have received from other than [facility] providers in the past year (date may be  approximate).     Assessment:   This is a routine wellness examination for Kathryn Mendez.  Hearing/Vision screen Hearing Screening - Comments:: Denies hearing issues Vision Screening - Comments:: Regular eye exams,    Goals Addressed             This Visit's Progress    Patient Stated       11/12/2023, keep moving       Depression Screen    11/12/2023   11:42 AM 10/15/2023    2:54 PM 06/25/2023  8:50 AM 05/08/2023    8:27 AM 02/05/2023    3:24 PM 10/16/2022    3:16 PM 05/21/2022    2:10 PM  PHQ 2/9 Scores  PHQ - 2 Score 0 0 0 0 0 0 2  PHQ- 9 Score  0 0 0 0  2    Fall Risk    11/12/2023   11:41 AM 06/25/2023    8:49 AM 05/08/2023    8:27 AM 05/08/2023    8:26 AM 02/05/2023    3:24 PM  Fall Risk   Falls in the past year? 0 0 0 0 1  Number falls in past yr: 0 0 0 0 0  Injury with Fall? 0 0 0 0 1  Risk for fall due to : Medication side effect No Fall Risks No Fall Risks  History of fall(s)  Follow up Falls prevention discussed;Falls evaluation completed Falls evaluation completed Falls evaluation completed  Falls evaluation completed    MEDICARE RISK AT HOME: Medicare Risk at Home Any stairs in or around the home?: Yes If so, are there any without handrails?: Yes Home free of loose throw rugs in walkways, pet beds, electrical cords, etc?: Yes Adequate lighting in your home to reduce risk of falls?: Yes Life alert?: No Use of a cane, walker or w/c?: No Grab bars in the bathroom?: No Shower chair or bench in shower?: No Elevated toilet seat or a handicapped toilet?: Yes  TIMED UP AND GO:  Was the test performed?  Yes  Length of time to ambulate 10 feet: 5 sec Gait steady and fast without use of assistive device    Cognitive Function:        11/12/2023   11:42 AM 09/27/2021   11:08 AM 09/20/2020   12:38 PM 09/16/2019    8:46 AM 09/10/2018    9:09 AM  6CIT Screen  What Year? 0 points 0 points 0 points 0 points 0 points  What month? 0 points 0 points 0 points  0 points 0 points  What time? 0 points 0 points 0 points 0 points 0 points  Count back from 20 0 points 0 points 0 points 0 points 0 points  Months in reverse 4 points 4 points 4 points 0 points 4 points  Repeat phrase 4 points 8 points 2 points 4 points 2 points  Total Score 8 points 12 points 6 points 4 points 6 points    Immunizations Immunization History  Administered Date(s) Administered   Fluad Quad(high Dose 65+) 07/11/2020, 07/10/2022   Fluad Trivalent(High Dose 65+) 06/25/2023   Influenza, High Dose Seasonal PF 08/31/2018, 07/16/2019   Influenza-Unspecified 08/28/2018, 07/23/2021   PFIZER(Purple Top)SARS-COV-2 Vaccination 12/24/2019, 01/20/2020, 08/14/2020   PNEUMOCOCCAL CONJUGATE-20 10/04/2021   Pneumococcal Polysaccharide-23 09/10/2018   Tdap 08/02/2013   Unspecified SARS-COV-2 Vaccination 07/20/2023   Zoster Recombinant(Shingrix ) 09/26/2022, 11/26/2022    TDAP status: Completed at today's visit  Flu Vaccine status: Up to date  Pneumococcal vaccine status: Up to date  Covid-19 vaccine status: Completed vaccines  Qualifies for Shingles Vaccine? Yes   Zostavax completed Yes   Shingrix  Completed?: Yes  Screening Tests Health Maintenance  Topic Date Due   DTaP/Tdap/Td (2 - Td or Tdap) 08/03/2023   Medicare Annual Wellness (AWV)  11/11/2024   MAMMOGRAM  03/11/2025   Colonoscopy  05/27/2033   Pneumonia Vaccine 29+ Years old  Completed   INFLUENZA VACCINE  Completed   DEXA SCAN  Completed   COVID-19 Vaccine  Completed   Hepatitis C  Screening  Completed   Zoster Vaccines- Shingrix   Completed   HPV VACCINES  Aged Out    Health Maintenance  Health Maintenance Due  Topic Date Due   DTaP/Tdap/Td (2 - Td or Tdap) 08/03/2023    Colorectal cancer screening: No longer required.   Mammogram status: Completed 03/12/2023. Repeat every year  Bone Density status: Completed 09/04/2017.   Lung Cancer Screening: (Low Dose CT Chest recommended if Age 75-80 years, 20  pack-year currently smoking OR have quit w/in 15years.) does not qualify.   Lung Cancer Screening Referral: no  Additional Screening:  Hepatitis C Screening: does qualify; Completed 03/14/2014  Vision Screening: Recommended annual ophthalmology exams for early detection of glaucoma and other disorders of the eye. Is the patient up to date with their annual eye exam?  Yes  Who is the provider or what is the name of the office in which the patient attends annual eye exams? Can't remember name If pt is not established with a provider, would they like to be referred to a provider to establish care? No .   Dental Screening: Recommended annual dental exams for proper oral hygiene  Diabetic Foot Exam: n/a  Community Resource Referral / Chronic Care Management: CRR required this visit?  No   CCM required this visit?  No     Plan:     I have personally reviewed and noted the following in the patient's chart:   Medical and social history Use of alcohol, tobacco or illicit drugs  Current medications and supplements including opioid prescriptions. Patient is not currently taking opioid prescriptions. Functional ability and status Nutritional status Physical activity Advanced directives List of other physicians Hospitalizations, surgeries, and ER visits in previous 12 months Vitals Screenings to include cognitive, depression, and falls Referrals and appointments  In addition, I have reviewed and discussed with patient certain preventive protocols, quality metrics, and best practice recommendations. A written personalized care plan for preventive services as well as general preventive health recommendations were provided to patient.     Areatha Beecham, LPN   1/61/0960   After Visit Summary: (In Person-Printed) AVS printed and given to the patient  Nurse Notes: none

## 2023-11-12 NOTE — Addendum Note (Signed)
 Addended by: Twanda Stakes E on: 11/12/2023 12:05 PM   Modules accepted: Orders

## 2023-11-12 NOTE — Patient Instructions (Signed)
 Ms. Boedigheimer , Thank you for taking time to come for your Medicare Wellness Visit. I appreciate your ongoing commitment to your health goals. Please review the following plan we discussed and let me know if I can assist you in the future.   Referrals/Orders/Follow-Ups/Clinician Recommendations: none  This is a list of the screening recommended for you and due dates:  Health Maintenance  Topic Date Due   DTaP/Tdap/Td vaccine (2 - Td or Tdap) 08/03/2023   Medicare Annual Wellness Visit  11/11/2024   Mammogram  03/11/2025   Colon Cancer Screening  05/27/2033   Pneumonia Vaccine  Completed   Flu Shot  Completed   DEXA scan (bone density measurement)  Completed   COVID-19 Vaccine  Completed   Hepatitis C Screening  Completed   Zoster (Shingles) Vaccine  Completed   HPV Vaccine  Aged Out    Advanced directives: (Declined) Advance directive discussed with you today. Even though you declined this today, please call our office should you change your mind, and we can give you the proper paperwork for you to fill out.  Next Medicare Annual Wellness Visit scheduled for next year: No, office will schedule  insert Preventive Care attachment Insert FALL PREVENTION attachment if needed

## 2023-11-13 ENCOUNTER — Telehealth: Payer: Self-pay | Admitting: *Deleted

## 2023-11-13 NOTE — Progress Notes (Signed)
Complex Care Management Note  Care Guide Note 11/13/2023 Name: Kathryn Mendez MRN: [id number] DOB: 12/17/1950  Kathryn Mendez is a 73 y.o. year old female who sees Dorothyann Peng, MD for primary care. I reached out to Gardiner Coins by phone today to offer complex care management services.  [name] was given information about Complex Care Management services today including:   The Complex Care Management services include support from the care team which includes your Nurse Coordinator, Clinical Social Worker, or Pharmacist.  The Complex Care Management team is here to help remove barriers to the health concerns and goals most important to you. Complex Care Management services are voluntary, and the patient may decline or stop services at any time by request to their care team member.   Complex Care Management Consent Status: Patient agreed to services and verbal consent obtained.   Follow up plan:  Telephone appointment with complex care management team member scheduled for:  12/01/23  Encounter Outcome:  Patient Scheduled  Gwenevere Ghazi  [facility]  [facility], [facility] Guide  Direct Dial: [phone]  Fax [phone]

## 2023-11-25 ENCOUNTER — Other Ambulatory Visit: Payer: Self-pay | Admitting: Internal Medicine

## 2023-12-01 ENCOUNTER — Ambulatory Visit: Payer: Self-pay | Admitting: Licensed Clinical Social Worker

## 2023-12-01 NOTE — Patient Outreach (Signed)
  Care Coordination   Initial Visit Note   12/01/2023 Name: Kathryn Mendez MRN: [id number] DOB: 1951/07/19  Kathryn Mendez is a 73 y.o. year old female who sees Kathryn Peng, MD for primary care. I spoke with  Kathryn Mendez by phone today.  What matters to the patients health and wellness today?  Food insecurities and Utility assistance     Goals Addressed             This Visit's Progress    Care Coordinated Activities       Care Coordination Interventions: Patient stated that she runs low on food an she does get about $20 a month of SNAP benefits and the Sw discussed with the patient about the local food pantries and will mail the resource list out. Patient stated that her utility bills are getting high, SW discussed with the patient about calling the utility companies to set up payment arrangements and the SW will also mail out a resources list for utility assistance.  SW went over all SDOH and no other current needs. SW will follow up on 12/15/2023 at 11:00 am.         SDOH assessments and interventions completed:  Yes  SDOH Interventions Today    Flowsheet Row Most Recent Value  SDOH Interventions   Food Insecurity Interventions Community Resources Provided  [Sw will mail out food pantry list.]  Housing Interventions Intervention Not Indicated  Transportation Interventions Intervention Not Indicated  Utilities Interventions --  [SW will send out resource list]        Care Coordination Interventions:  Yes, provided  Interventions Today    Flowsheet Row Most Recent Value  General Interventions   General Interventions Discussed/Reviewed General Interventions Discussed, Community Resources  [Food and utility resources will be mailed]        Follow up plan: Follow up call scheduled for 12/15/2023 at 11:00 am    Encounter Outcome:  Patient Visit Completed  Kathryn Cooks, PhD [facility], [facility] Social Worker Direct Dial: [phone]  Fax: [phone]

## 2023-12-01 NOTE — Patient Instructions (Signed)
Visit Information  Thank you for taking time to visit with me today. Please don't hesitate to contact me if I can be of assistance to you.   Following are the goals we discussed today:   Goals Addressed             This Visit's Progress    Care Coordinated Activities       Care Coordination Interventions: Patient stated that she runs low on food an she does get about $20 a month of SNAP benefits and the Sw discussed with the patient about the local food pantries and will mail the resource list out. Patient stated that her utility bills are getting high, SW discussed with the patient about calling the utility companies to set up payment arrangements and the SW will also mail out a resources list for utility assistance.  SW went over all SDOH and no other current needs. SW will follow up on 12/15/2023 at 11:00 am.         Our next appointment is by telephone on 12/14/2022 at 11:00 am  Please call the care guide team at [phone] if you need to cancel or reschedule your appointment.   If you are experiencing a Mental Health or Behavioral Health Crisis or need someone to talk to, please   Patient verbalizes understanding of instructions and care plan provided today and agrees to view in MyChart. Active MyChart status and patient understanding of how to access instructions and care plan via MyChart confirmed with patient.     Jeanie Cooks, PhD [facility], [facility] Social Worker Direct Dial: [phone]  Fax: [phone]

## 2023-12-12 ENCOUNTER — Telehealth: Payer: Self-pay | Admitting: *Deleted

## 2023-12-12 NOTE — Progress Notes (Signed)
Complex Care Management Care Guide Note  12/12/2023 Name: NAZARETH NORENBERG MRN: [id number] DOB: May 10, 1951  BIRD TAILOR is a 73 y.o. year old female who is a primary care patient of Dorothyann Peng, MD and is actively engaged with the care management team. I reached out to Gardiner Coins by phone today to assist with re-scheduling  with the BSW.  Follow up plan: Unsuccessful telephone outreach attempt made. A HIPAA compliant phone message was left for the patient providing contact information and requesting a return call.  Gwenevere Ghazi  [facility]  [facility], [facility] Guide  Direct Dial: [phone]  Fax [phone]

## 2023-12-15 ENCOUNTER — Encounter: Payer: Self-pay | Admitting: Licensed Clinical Social Worker

## 2023-12-18 NOTE — Progress Notes (Signed)
Complex Care Management Care Guide Note  12/18/2023 Name: Kathryn Mendez MRN: [id number] DOB: January 02, 1951  Kathryn Mendez is a 73 y.o. year old female who is a primary care patient of Dorothyann Peng, MD and is actively engaged with the care management team. I reached out to Kathryn Mendez by phone today to assist with re-scheduling  with the BSW.  Follow up plan: Telephone appointment with complex care management team member scheduled for:  3/6  Kathryn Mendez  [facility]  [facility], [facility] Guide  Direct Dial: [phone]  Fax [phone]

## 2023-12-25 ENCOUNTER — Telehealth: Payer: Self-pay | Admitting: *Deleted

## 2023-12-25 NOTE — Progress Notes (Unsigned)
 Complex Care Management Care Guide Note  12/25/2023 Name: Kathryn Mendez MRN: [id number] DOB: [birth year]  Kathryn Mendez is a 73 y.o. year old female who is a primary care patient of Dorothyann Peng, MD and is actively engaged with the care management team. I reached out to Gardiner Coins by phone today to assist with re-scheduling  with the BSW.  Follow up plan: Unsuccessful telephone outreach attempt made. A HIPAA compliant phone message was left for the patient providing contact information and requesting a return call.  Gwenevere Ghazi  [facility] Health  Value-Based Care Institute, [facility] Guide  Direct Dial: [phone]  Fax [phone]

## 2023-12-26 NOTE — Progress Notes (Signed)
 Complex Care Management Care Guide Note  12/26/2023 Name: INGRID SHIFRIN MRN: [id number] DOB: May 24, 1951  VALEREE LEIDY is a 73 y.o. year old female who is a primary care patient of Dorothyann Peng, MD and is actively engaged with the care management team. I reached out to Gardiner Coins by phone today to assist with re-scheduling  with the BSW.  Follow up plan: Telephone appointment with complex care management team member scheduled for:  01/13/24  Gwenevere Ghazi  [facility]  Value-Based Care Institute, [facility] Guide  Direct Dial: [phone]  Fax [phone]

## 2024-01-01 ENCOUNTER — Encounter: Payer: Self-pay | Admitting: Licensed Clinical Social Worker

## 2024-01-13 ENCOUNTER — Ambulatory Visit: Payer: Self-pay | Admitting: Licensed Clinical Social Worker

## 2024-01-13 NOTE — Patient Outreach (Signed)
 Care Coordination   01/13/2024 Name: Kathryn Mendez MRN: [id number] DOB: 10/29/1950   Care Coordination Outreach Attempts:  An unsuccessful outreach was attempted for an appointment today.  Follow Up Plan:  Additional outreach attempts will be made to offer the patient complex care management information and services.   Encounter Outcome:  No Answer   Care Coordination Interventions:  No, not indicated    SW will attempt a 2nd follow up call on 01/28/2024 at 2:00 pm  Jeanie Cooks, PhD [facility], [facility] Social Worker Direct Dial: [phone]  Fax: [phone]

## 2024-01-27 ENCOUNTER — Ambulatory Visit: Payer: Self-pay | Admitting: Licensed Clinical Social Worker

## 2024-01-27 NOTE — Patient Outreach (Signed)
 Care Coordination   Follow Up Visit Note   01/27/2024 Name: Kathryn Mendez MRN: [id number] DOB: 06-03-1951  Kathryn Mendez is a 73 y.o. year old female who sees Dorothyann Peng, MD for primary care. I spoke with  Kathryn Mendez by phone today.  What matters to the patients health and wellness today?  Food Insecurities    Goals Addressed             This Visit's Progress    COMPLETED: Care Coordinated Activities       Care Coordination Interventions: Patient stated that she runs low on food an she does get about $20 a month of SNAP benefits and the Sw discussed with the patient about the local food pantries and will mail the resource list out. Patient stated that her utility bills are getting high, SW discussed with the patient about calling the utility companies to set up payment arrangements and the SW will also mail out a resources list for utility assistance.  SW went over all SDOH and no other current needs. SW will follow up on 12/15/2023 at 11:00 am.         SDOH assessments and interventions completed:  Yes  SDOH Interventions Today    Flowsheet Row Most Recent Value  SDOH Interventions   Food Insecurity Interventions Intervention Not Indicated  [Patient received the resources]  Housing Interventions Intervention Not Indicated  Transportation Interventions Intervention Not Indicated        Care Coordination Interventions:  Yes, provided  Interventions Today    Flowsheet Row Most Recent Value  General Interventions   General Interventions Discussed/Reviewed General Interventions Reviewed, Science writer recieved resources via mail]        Follow up plan: No further intervention required.   Encounter Outcome:  Patient Visit Completed  Jeanie Cooks, PhD [facility], [facility] Social Worker Direct Dial: [phone]  Fax: [phone]

## 2024-01-27 NOTE — Patient Instructions (Signed)
 Visit Information  Thank you for taking time to visit with me today. Please don't hesitate to contact me if I can be of assistance to you.   Following are the goals we discussed today:   Goals Addressed             This Visit's Progress    COMPLETED: Care Coordinated Activities       Care Coordination Interventions: Patient stated that she runs low on food an she does get about $20 a month of SNAP benefits and the Sw discussed with the patient about the local food pantries and will mail the resource list out. Patient stated that her utility bills are getting high, SW discussed with the patient about calling the utility companies to set up payment arrangements and the SW will also mail out a resources list for utility assistance.  SW went over all SDOH and no other current needs. SW will follow up on 12/15/2023 at 11:00 am.         No further follow up needed. SW encouraged the patient to contact the PCP if any SDOH needs arise.  Please call the care guide team at [phone] if you need to cancel or reschedule your appointment.   If you are experiencing a Mental Health or Behavioral Health Crisis or need someone to talk to, please call the Suicide and Crisis Lifeline: 988 go to [facility] [address], [place] [phone]) call 911  Patient verbalizes understanding of instructions and care plan provided today and agrees to view in MyChart. Active MyChart status and patient understanding of how to access instructions and care plan via MyChart confirmed with patient.     Jeanie Cooks, PhD [facility], [facility] Social Worker Direct Dial: [phone]  Fax: [phone]

## 2024-02-12 ENCOUNTER — Other Ambulatory Visit: Payer: Self-pay | Admitting: Internal Medicine

## 2024-02-12 DIAGNOSIS — Z0000 Encounter for general adult medical examination without abnormal findings: Secondary | ICD-10-CM

## 2024-03-05 ENCOUNTER — Encounter (HOSPITAL_COMMUNITY): Payer: Self-pay

## 2024-03-12 ENCOUNTER — Ambulatory Visit
Admission: RE | Admit: 2024-03-12 | Discharge: 2024-03-12 | Disposition: A | Discharge status: Home / Self Care | Source: Ambulatory Visit | Attending: Internal Medicine | Admitting: Internal Medicine

## 2024-03-12 DIAGNOSIS — Z0000 Encounter for general adult medical examination without abnormal findings: Secondary | ICD-10-CM

## 2024-03-12 DIAGNOSIS — Z1231 Encounter for screening mammogram for malignant neoplasm of breast: Secondary | ICD-10-CM | POA: Diagnosis not present

## 2024-05-13 DIAGNOSIS — H26493 Other secondary cataract, bilateral: Secondary | ICD-10-CM | POA: Diagnosis not present

## 2024-05-13 DIAGNOSIS — H18413 Arcus senilis, bilateral: Secondary | ICD-10-CM | POA: Diagnosis not present

## 2024-05-13 DIAGNOSIS — Z961 Presence of intraocular lens: Secondary | ICD-10-CM | POA: Diagnosis not present

## 2024-05-13 DIAGNOSIS — H35033 Hypertensive retinopathy, bilateral: Secondary | ICD-10-CM | POA: Diagnosis not present

## 2024-05-26 ENCOUNTER — Emergency Department (HOSPITAL_BASED_OUTPATIENT_CLINIC_OR_DEPARTMENT_OTHER)
Admission: EM | Admit: 2024-05-26 | Discharge: 2024-05-27 | Disposition: A | Discharge status: Home / Self Care | Attending: Emergency Medicine | Admitting: Emergency Medicine

## 2024-05-26 ENCOUNTER — Other Ambulatory Visit: Payer: Self-pay

## 2024-05-26 ENCOUNTER — Encounter (HOSPITAL_BASED_OUTPATIENT_CLINIC_OR_DEPARTMENT_OTHER): Payer: Self-pay | Admitting: Emergency Medicine

## 2024-05-26 DIAGNOSIS — S61411A Laceration without foreign body of right hand, initial encounter: Secondary | ICD-10-CM | POA: Diagnosis not present

## 2024-05-26 DIAGNOSIS — W268XXA Contact with other sharp object(s), not elsewhere classified, initial encounter: Secondary | ICD-10-CM | POA: Diagnosis not present

## 2024-05-26 DIAGNOSIS — I10 Essential (primary) hypertension: Secondary | ICD-10-CM | POA: Insufficient documentation

## 2024-05-26 DIAGNOSIS — S6991XA Unspecified injury of right wrist, hand and finger(s), initial encounter: Secondary | ICD-10-CM | POA: Diagnosis present

## 2024-05-26 DIAGNOSIS — Z79899 Other long term (current) drug therapy: Secondary | ICD-10-CM | POA: Insufficient documentation

## 2024-05-26 DIAGNOSIS — S61216A Laceration without foreign body of right little finger without damage to nail, initial encounter: Secondary | ICD-10-CM | POA: Insufficient documentation

## 2024-05-26 MED ORDER — LIDOCAINE-EPINEPHRINE (PF) 2 %-1:200000 IJ SOLN
20.0000 mL | Freq: Once | INTRAMUSCULAR | Status: AC
  Administered 2024-05-27: 20 mL
  Filled 2024-05-26: qty 20

## 2024-05-26 NOTE — ED Provider Notes (Signed)
 LACERATION REPAIR Performed by: Margit DELENA Paris Authorized by: Margit DELENA Paris Consent: Verbal consent obtained. Risks and benefits: risks, benefits and alternatives were discussed Consent given by: patient Patient identity confirmed: provided demographic data Prepped and Draped in normal sterile fashion Wound explored  Laceration Location: right palm base of 5th finger  Laceration Length: 2 cm  No Foreign Bodies seen or palpated  Anesthesia: local infiltration  Local anesthetic: lidocaine  2% w/epinephrine   Anesthetic total: 1 ml  Irrigation method: syringe Amount of cleaning: standard  Skin closure: 4-0 prolene  Number of sutures: 3  Technique: simple interrupted  Patient tolerance: Patient tolerated the procedure well with no immediate complications.    Paris Margit, PA-C 05/26/24 2342    Bari Charmaine FALCON, MD 05/27/24 [phone]

## 2024-05-26 NOTE — Discharge Instructions (Signed)
 You were seen today for a laceration.  Have sutures removed in 7 to 10 days.  Keep clean and dry.  Do not submerge.  Of note, you also had high blood pressure.  Make sure that you are taking your blood pressure medications and follow-up closely with your primary doctor.

## 2024-05-26 NOTE — ED Provider Notes (Signed)
 [place] EMERGENCY DEPARTMENT AT [facility] Provider Note   CSN: [id number] Arrival date & time: 05/26/24  2226     Patient presents with: No chief complaint on file.   Kathryn Mendez is a 73 y.o. female.   HPI     This is a 73 year old female who presents with right hand pain and laceration.  Patient reports that she was hanging some curtains when she missed stepped off the bed hitting her hand on the air vent.  She denies hitting her head or loss of consciousness.  She denies other injury.  Not on any blood thinners.  She is right-handed.  Reports tetanus shot was approximately 1 month ago.  Prior to Admission medications   Medication Sig Start Date End Date Taking? Authorizing Provider  albuterol  (VENTOLIN  HFA) 108 (90 Base) MCG/ACT inhaler Inhale 2 puffs into the lungs every 4 (four) hours as needed for wheezing or shortness of breath. 10/16/22   Jarold Medici, MD  allopurinol  (ZYLOPRIM ) 100 MG tablet TAKE 1 TABLET(100 MG) BY MOUTH DAILY 11/25/23   Jarold Medici, MD  amLODipine  (NORVASC ) 10 MG tablet Take 1 tablet (10 mg total) by mouth daily. 10/15/23   Petrina Pries, NP  hydrALAZINE  (APRESOLINE ) 10 MG tablet TAKE 1 TABLET BY MOUTH three times daily 06/25/23   Jarold Medici, MD  meloxicam  (MOBIC ) 15 MG tablet TAKE 1 TABLET BY MOUTH DAILY AS NEEDED 11/12/23   Petrina Pries, NP  nitroGLYCERIN  (NITROSTAT ) 0.4 MG SL tablet Place 1 tablet (0.4 mg total) under the tongue every 5 (five) minutes x 3 doses as needed for chest pain. 01/27/23   Duke, Jon Garre, PA  predniSONE  (DELTASONE ) 5 MG tablet Use as directed Patient not taking: Reported on 11/12/2023 10/15/23   Petrina Pries, NP  rosuvastatin  (CRESTOR ) 20 MG tablet Take 1 tablet (20 mg total) by mouth daily. 10/15/23 10/14/24  Petrina Pries, NP    Allergies: Valsartan    Review of Systems  Skin:  Positive for wound.  All other systems reviewed and are negative.   Updated Vital Signs BP (!) 201/75   Pulse 87    Temp 97.6 F (36.4 C) (Oral)   Resp 16   SpO2 97%   Physical Exam Vitals and nursing note reviewed.  Constitutional:      Appearance: She is well-developed. She is not ill-appearing.  HENT:     Head: Normocephalic and atraumatic.     Mouth/Throat:     Mouth: Mucous membranes are moist.  Cardiovascular:     Rate and Rhythm: Normal rate and regular rhythm.  Pulmonary:     Effort: Pulmonary effort is normal. No respiratory distress.  Musculoskeletal:     Cervical back: Neck supple.     Comments: Focused examination of the right hand with small 2 cm avulsion of skin over the dorsum of the hand just proximal to the fifth digit, flexion and extension of all digits intact, no active bleeding, neurovascularly intact  Skin:    General: Skin is warm and dry.  Neurological:     Mental Status: She is alert and oriented to person, place, and time.  Psychiatric:        Mood and Affect: Mood normal.     (all labs ordered are listed, but only abnormal results are displayed) Labs Reviewed - No data to display  EKG: None  Radiology: No results found.   Procedures   Medications Ordered in the ED  lidocaine -EPINEPHrine  (XYLOCAINE  W/EPI) 2 %-1:200000 (PF) injection 20 mL (  has no administration in time range)                                    Medical Decision Making Risk Prescription drug management.   This patient presents to the ED for concern of hand injury, this involves an extensive number of treatment options, and is a complaint that carries with it a high risk of complications and morbidity.  I considered the following differential and admission for this acute, potentially life threatening condition.  The differential diagnosis includes laceration, fracture  MDM:    This is a 73 year old female who presents with cut to the right hand.  She is nontoxic and vital signs are notable for blood pressure of 102/75.  History of hypertension.  Patient is neurologically intact.  No  significant tenderness at site of injury.  Low suspicion for occult fracture.  Laceration was repaired at bedside by PA.  See separate note for details.  Tetanus is up-to-date.  No advanced imaging indicated.  (Labs, imaging, consults)  Labs: I Ordered, and personally interpreted labs.  The pertinent results include: None  Imaging Studies ordered: I ordered imaging studies including none I independently visualized and interpreted imaging. I agree with the radiologist interpretation  Additional history obtained from chart review.  External records from outside source obtained and reviewed including prior evaluations  Cardiac Monitoring: The patient was not maintained on a cardiac monitor.  If on the cardiac monitor, I personally viewed and interpreted the cardiac monitored which showed an underlying rhythm of: N/A  Reevaluation: After the interventions noted above, I reevaluated the patient and found that they have :stayed the same  Social Determinants of Health:  lives independently  Disposition: Discharge  Co morbidities that complicate the patient evaluation  Past Medical History:  Diagnosis Date   High cholesterol    Hypertension      Medicines Meds ordered this encounter  Medications   lidocaine -EPINEPHrine  (XYLOCAINE  W/EPI) 2 %-1:200000 (PF) injection 20 mL    I have reviewed the patients home medicines and have made adjustments as needed  Problem List / ED Course: Problem List Items Addressed This Visit   None Visit Diagnoses       Laceration of right hand without foreign body, initial encounter    -  Primary                Final diagnoses:  Laceration of right hand without foreign body, initial encounter    ED Discharge Orders     None          Shaneen Reeser, Charmaine FALCON, MD 05/26/24 2340

## 2024-05-26 NOTE — ED Triage Notes (Addendum)
 Cut hand on air vent. Right hand avulsion base of right pinky. Tetanus shot 1 month ago.   HTN in triage. Denies CP, SOB, HA, Vision changes.

## 2024-05-27 NOTE — ED Notes (Signed)
 Dc instructions given, wound dressed with gauze and coband. C/D/I. Verbalized understanding. Out of ED with steady gait, all paperwork not in visible distress.

## 2024-06-06 ENCOUNTER — Emergency Department (HOSPITAL_BASED_OUTPATIENT_CLINIC_OR_DEPARTMENT_OTHER)
Admission: EM | Admit: 2024-06-06 | Discharge: 2024-06-06 | Disposition: A | Discharge status: Home / Self Care | Attending: Emergency Medicine | Admitting: Emergency Medicine

## 2024-06-06 ENCOUNTER — Encounter (HOSPITAL_BASED_OUTPATIENT_CLINIC_OR_DEPARTMENT_OTHER): Payer: Self-pay

## 2024-06-06 DIAGNOSIS — Z4802 Encounter for removal of sutures: Secondary | ICD-10-CM | POA: Diagnosis not present

## 2024-06-06 DIAGNOSIS — S61411D Laceration without foreign body of right hand, subsequent encounter: Secondary | ICD-10-CM | POA: Diagnosis not present

## 2024-06-06 NOTE — Discharge Instructions (Addendum)
 Today you were seen for suture removal.  Please keep the area clean and dry until fully healed.  If you have red streaking up your hand, uncontrollable vomiting, or fever that does not go down with Tylenol  or Motrin please return to the ED.  Thank you for letting us  treat you today. After performing a physical exam, I feel you are safe to go home. Please follow up with your PCP in the next several days and provide them with your records from this visit. Return to the Emergency Room if pain becomes severe or symptoms worsen.

## 2024-06-06 NOTE — ED Triage Notes (Signed)
 Patient here for suture removal from a laceration on right hand.

## 2024-06-06 NOTE — ED Provider Notes (Signed)
 [place] EMERGENCY DEPARTMENT AT [facility] Provider Note   CSN: [id number] Arrival date & time: 06/06/24  1356     Patient presents with: Suture / Staple Removal   Kathryn Mendez is a 73 y.o. female presents today for suture removal.  Patient had sutures placed in her right hand on 7/30 for a laceration.  Patient denies any puslike discharge, fever, warmth, nausea, vomiting, any other complaints at this time.    Suture / Staple Removal       Prior to Admission medications   Medication Sig Start Date End Date Taking? Authorizing Provider  albuterol  (VENTOLIN  HFA) 108 (90 Base) MCG/ACT inhaler Inhale 2 puffs into the lungs every 4 (four) hours as needed for wheezing or shortness of breath. 10/16/22   Jarold Medici, MD  allopurinol  (ZYLOPRIM ) 100 MG tablet TAKE 1 TABLET(100 MG) BY MOUTH DAILY 11/25/23   Jarold Medici, MD  amLODipine  (NORVASC ) 10 MG tablet Take 1 tablet (10 mg total) by mouth daily. 10/15/23   Petrina Pries, NP  hydrALAZINE  (APRESOLINE ) 10 MG tablet TAKE 1 TABLET BY MOUTH three times daily 06/25/23   Jarold Medici, MD  meloxicam  (MOBIC ) 15 MG tablet TAKE 1 TABLET BY MOUTH DAILY AS NEEDED 11/12/23   Petrina Pries, NP  nitroGLYCERIN  (NITROSTAT ) 0.4 MG SL tablet Place 1 tablet (0.4 mg total) under the tongue every 5 (five) minutes x 3 doses as needed for chest pain. 01/27/23   [facility], Jon Garre, PA  predniSONE  (DELTASONE ) 5 MG tablet Use as directed Patient not taking: Reported on 11/12/2023 10/15/23   Petrina Pries, NP  rosuvastatin  (CRESTOR ) 20 MG tablet Take 1 tablet (20 mg total) by mouth daily. 10/15/23 10/14/24  Petrina Pries, NP    Allergies: Valsartan    Review of Systems  Skin:  Positive for wound.    Updated Vital Signs BP (!) 184/78 (BP Location: Left Arm)   Pulse 84   Temp 98 F (36.7 C)   Resp 18   SpO2 97%   Physical Exam Vitals and nursing note reviewed.  Constitutional:      General: She is not in acute distress.     Appearance: Normal appearance. She is well-developed. She is not ill-appearing.  HENT:     Head: Normocephalic and atraumatic.     Right Ear: External ear normal.     Nose: Nose normal.  Eyes:     Extraocular Movements: Extraocular movements intact.     Conjunctiva/sclera: Conjunctivae normal.  Cardiovascular:     Rate and Rhythm: Normal rate and regular rhythm.  Pulmonary:     Effort: Pulmonary effort is normal. No respiratory distress.  Abdominal:     Palpations: Abdomen is soft.  Musculoskeletal:        General: No swelling.     Cervical back: Neck supple.  Skin:    General: Skin is warm and dry.     Capillary Refill: Capillary refill takes less than 2 seconds.     Comments: Well-healing laceration just proximal to the base of the right pinky finger on the palmar side.  No erythema or purulent discharge.  3 intact sutures noted on exam.  Neurological:     Mental Status: She is alert.  Psychiatric:        Mood and Affect: Mood normal.     (all labs ordered are listed, but only abnormal results are displayed) Labs Reviewed - No data to display  EKG: None  Radiology: No results found.   Suture Removal  Date/Time: 06/06/2024 2:23 PM  Performed by: Francis Ileana SAILOR, PA-C Authorized by: Francis Ileana SAILOR, PA-C   Consent:    Consent obtained:  Verbal   Consent given by:  Patient   Risks discussed:  Bleeding and pain Universal protocol:    Patient identity confirmed:  Arm band Location:    Location:  Upper extremity   Upper extremity location:  Hand   Hand location: Right palm. Procedure details:    Wound appearance:  No signs of infection, nonpurulent, good wound healing and clean   Number of sutures removed:  3 Post-procedure details:    Post-removal:  No dressing applied   Procedure completion:  Tolerated    Medications Ordered in the ED - No data to display                                  Medical Decision Making  This patient presents to the ED for  concern of suture removal differential diagnosis includes cellulitis, abscess, well-healing laceration    Problem List / ED Course:  Considered for admission or further workup however patient's vital signs and physical exam are reassuring.  Patient's laceration is well-healing and has no signs of infection at this time.  Patient given return precautions.  I feel patient is safe for discharge at this time.       Final diagnoses:  Visit for suture removal    ED Discharge Orders     None          Francis Ileana SAILOR, PA-C 06/06/24 1429    Kammerer, Megan L, DO 06/09/24 1622

## 2024-06-30 ENCOUNTER — Ambulatory Visit: Payer: Self-pay | Admitting: Internal Medicine

## 2024-06-30 ENCOUNTER — Ambulatory Visit: Payer: Medicare HMO | Admitting: Internal Medicine

## 2024-06-30 ENCOUNTER — Encounter: Payer: Self-pay | Admitting: Internal Medicine

## 2024-06-30 VITALS — BP 140/80 | HR 81 | Temp 97.7°F | Ht 61.0 in | Wt 159.6 lb

## 2024-06-30 DIAGNOSIS — R9431 Abnormal electrocardiogram [ECG] [EKG]: Secondary | ICD-10-CM

## 2024-06-30 DIAGNOSIS — I119 Hypertensive heart disease without heart failure: Secondary | ICD-10-CM | POA: Diagnosis not present

## 2024-06-30 DIAGNOSIS — E66811 Obesity, class 1: Secondary | ICD-10-CM

## 2024-06-30 DIAGNOSIS — J301 Allergic rhinitis due to pollen: Secondary | ICD-10-CM | POA: Diagnosis not present

## 2024-06-30 DIAGNOSIS — R809 Proteinuria, unspecified: Secondary | ICD-10-CM | POA: Diagnosis not present

## 2024-06-30 DIAGNOSIS — Z0000 Encounter for general adult medical examination without abnormal findings: Secondary | ICD-10-CM | POA: Diagnosis not present

## 2024-06-30 DIAGNOSIS — Z6830 Body mass index (BMI) 30.0-30.9, adult: Secondary | ICD-10-CM

## 2024-06-30 DIAGNOSIS — E782 Mixed hyperlipidemia: Secondary | ICD-10-CM

## 2024-06-30 DIAGNOSIS — I2510 Atherosclerotic heart disease of native coronary artery without angina pectoris: Secondary | ICD-10-CM | POA: Diagnosis not present

## 2024-06-30 DIAGNOSIS — R7309 Other abnormal glucose: Secondary | ICD-10-CM | POA: Diagnosis not present

## 2024-06-30 DIAGNOSIS — N6342 Unspecified lump in left breast, subareolar: Secondary | ICD-10-CM | POA: Diagnosis not present

## 2024-06-30 DIAGNOSIS — E6609 Other obesity due to excess calories: Secondary | ICD-10-CM

## 2024-06-30 LAB — POCT URINALYSIS DIP (CLINITEK)
Bilirubin, UA: NEGATIVE
Blood, UA: NEGATIVE
Glucose, UA: NEGATIVE mg/dL
Ketones, POC UA: NEGATIVE mg/dL
Nitrite, UA: NEGATIVE
POC PROTEIN,UA: >=300 — AB
Spec Grav, UA: 1.015 (ref 1.010–1.025)
Urobilinogen, UA: 0.2 U/dL
pH, UA: 6.5 (ref 5.0–8.0)

## 2024-06-30 NOTE — Assessment & Plan Note (Signed)
 CCTA results reviewed. She is encouraged to follow a heart healthy lifestyle. She is encouraged to aim for at least 150 minutes of exercise/week.  Most recent Cardiology note reviewed.  - continue with ASA and statin therapy

## 2024-06-30 NOTE — Progress Notes (Signed)
 I,Kathryn Mendez, CMA,acting as a Neurosurgeon for Kathryn LOISE Slocumb, MD.,have documented all relevant documentation on the behalf of Kathryn LOISE Slocumb, MD,as directed by  Kathryn LOISE Slocumb, MD while in the presence of Kathryn LOISE Slocumb, MD.  Subjective:    Patient ID: Kathryn Mendez , female    DOB: 02-15-1951 , 73 y.o.   MRN: [id number]  Chief Complaint  Patient presents with   Annual Exam    Patient presents today for annual exam. She reports compliance with medications. Denies headache, chest pain & sob.  She reports noticing high bp readings. She has not missed any doses. She reports taking Amlodipine  & Hydralazine  in the morning.    Hyperlipidemia   Hypertension    HPI Discussed the use of AI scribe software for clinical note transcription with the patient, who gave verbal consent to proceed.  History of Present Illness Kathryn Mendez is a 73 year old female with hypertension and coronary artery disease who presents for a blood pressure check.  She has been experiencing elevated blood pressure readings at home, with some improvement noted through her management efforts. During a recent emergency room visit, her blood pressure was elevated, which she attributes to stress from cutting herself. Her current medications include hydralazine  three times a day, amlodipine  10 mg, and allopurinol . She recalls better blood pressure control with metoprolol , which was discontinued due to QT prolongation.  She has a history of coronary artery disease and has not seen her cardiologist since April of last year, missing a follow-up appointment scheduled for October. Her insurance has been contacting her about overdue blood work, and she acknowledges it has been over a year since her last lab work.  She experiences swelling in her lower extremities. Her exercise routine has decreased to about twice a week, but she acknowledges that increasing her exercise to three times a week helps her feel better  and manage her blood pressure.  She has a history of allergies and was taken off all allergy medications during a hospital visit last year. She has tried Zyrtec  in the past and is open to trying it again. She recalls an allergic reaction to valsartan, which caused swelling of her throat and lips.  She drinks alcohol occasionally but not regularly. She attempts to perform her own breast exams and reports good bowel movements. No regular alcohol consumption.   Patient presents today for HM. She is no longer followed by GYN. She reports compliance with BP meds. She denies headaches, chest pain and shortness of breath.      Hypertension This is a chronic problem. The current episode started more than 1 year ago. The problem has been gradually improving since onset. The problem is controlled. Pertinent negatives include no blurred vision, chest pain, palpitations or shortness of breath. Risk factors for coronary artery disease include dyslipidemia, post-menopausal state, sedentary lifestyle and obesity. The current treatment provides moderate improvement. Hypertensive end-organ damage includes kidney disease.  Hyperlipidemia This is a chronic problem. The current episode started more than 1 year ago. Exacerbating diseases include obesity. Pertinent negatives include no chest pain or shortness of breath. Current antihyperlipidemic treatment includes statins. The current treatment provides moderate improvement of lipids.     Past Medical History:  Diagnosis Date   High cholesterol    Hypertension      Family History  Problem Relation Age of Onset   Diabetes Mother    Hypertension Mother    Stroke Mother    Heart attack  Father 66   Stroke Father    Diabetes Sister    Breast cancer Neg Hx    BRCA 1/2 Neg Hx      Current Outpatient Medications:    albuterol  (VENTOLIN  HFA) 108 (90 Base) MCG/ACT inhaler, Inhale 2 puffs into the lungs every 4 (four) hours as needed for wheezing or shortness  of breath., Disp: 18 g, Rfl: 3   amLODipine  (NORVASC ) 10 MG tablet, Take 1 tablet (10 mg total) by mouth daily., Disp: 90 tablet, Rfl: 3   hydrALAZINE  (APRESOLINE ) 10 MG tablet, TAKE 1 TABLET BY MOUTH three times daily, Disp: 270 tablet, Rfl: 1   meloxicam  (MOBIC ) 15 MG tablet, TAKE 1 TABLET BY MOUTH DAILY AS NEEDED, Disp: 30 tablet, Rfl: 1   nitroGLYCERIN  (NITROSTAT ) 0.4 MG SL tablet, Place 1 tablet (0.4 mg total) under the tongue every 5 (five) minutes x 3 doses as needed for chest pain., Disp: 25 tablet, Rfl: 2   rosuvastatin  (CRESTOR ) 20 MG tablet, Take 1 tablet (20 mg total) by mouth daily., Disp: 90 tablet, Rfl: 3   allopurinol  (ZYLOPRIM ) 100 MG tablet, TAKE 1 TABLET(100 MG) BY MOUTH DAILY, Disp: 90 tablet, Rfl: 2   predniSONE  (DELTASONE ) 5 MG tablet, Use as directed (Patient not taking: Reported on 06/30/2024), Disp: 21 tablet, Rfl: 0   Allergies  Allergen Reactions   Valsartan Anaphylaxis    angioedema      The patient states she uses post menopausal status for birth control. No LMP recorded. Patient is postmenopausal.. Negative for Dysmenorrhea. Negative for: breast discharge, breast lump(s), breast pain and breast self exam. Associated symptoms include abnormal vaginal bleeding. Pertinent negatives include abnormal bleeding (hematology), anxiety, decreased libido, depression, difficulty falling sleep, dyspareunia, history of infertility, nocturia, sexual dysfunction, sleep disturbances, urinary incontinence, urinary urgency, vaginal discharge and vaginal itching. Diet regular.The patient states her exercise level is  intermittent.  . The patient's tobacco use is:  Social History   Tobacco Use  Smoking Status Never  Smokeless Tobacco Never  . She has been exposed to passive smoke. The patient's alcohol use is:  Social History   Substance and Sexual Activity  Alcohol Use Not Currently   Comment: occasional    Review of Systems  Constitutional: Negative.   HENT: Negative.     Eyes: Negative.  Negative for blurred vision.  Respiratory: Negative.  Negative for shortness of breath.   Cardiovascular: Negative.  Negative for chest pain and palpitations.  Gastrointestinal: Negative.   Endocrine: Negative.   Genitourinary: Negative.   Musculoskeletal: Negative.   Skin: Negative.   Allergic/Immunologic: Negative.   Neurological: Negative.   Hematological: Negative.   Psychiatric/Behavioral: Negative.       Today's Vitals   06/30/24 0850 06/30/24 0901  BP: (!) 144/88 (!) 140/80  Pulse: 81   Temp: 97.7 F (36.5 C)   SpO2: 98%   Weight: 159 lb 9.6 oz (72.4 kg)   Height: 5' 1 (1.549 m)    Body mass index is 30.16 kg/m.  Wt Readings from Last 3 Encounters:  06/30/24 159 lb 9.6 oz (72.4 kg)  11/12/23 156 lb 12.8 oz (71.1 kg)  10/15/23 156 lb (70.8 kg)     Objective:  Physical Exam Vitals and nursing note reviewed.  Constitutional:      Appearance: Normal appearance.  HENT:     Head: Normocephalic and atraumatic.     Right Ear: Tympanic membrane, ear canal and external ear normal.     Left Ear: Tympanic membrane, ear canal  and external ear normal.     Nose: Nose normal.     Mouth/Throat:     Mouth: Mucous membranes are moist.     Pharynx: Oropharynx is clear.  Eyes:     Extraocular Movements: Extraocular movements intact.     Conjunctiva/sclera: Conjunctivae normal.     Pupils: Pupils are equal, round, and reactive to light.  Cardiovascular:     Rate and Rhythm: Normal rate and regular rhythm.     Pulses: Normal pulses.     Heart sounds: Normal heart sounds.  Pulmonary:     Effort: Pulmonary effort is normal.     Breath sounds: Normal breath sounds.  Chest:  Breasts:    Tanner Score is 5.     Right: Normal.     Comments: Left subareolar mass Abdominal:     General: Bowel sounds are normal.     Palpations: Abdomen is soft.  Genitourinary:    Comments: deferred Musculoskeletal:        General: Tenderness present.     Cervical back:  Normal range of motion and neck supple.  Skin:    General: Skin is warm and dry.  Neurological:     General: No focal deficit present.     Mental Status: She is alert and oriented to person, place, and time.  Psychiatric:        Mood and Affect: Mood normal.        Behavior: Behavior normal.         Assessment And Plan:     Annual physical exam Assessment & Plan: A full exam was performed.  Importance of monthly self breast exams was discussed with the patient.  She is advised to get 30-45 minutes of regular exercise, no less than four to five days per week. Both weight-bearing and aerobic exercises are recommended.  She is advised to follow a healthy diet with at least six fruits/veggies per day, decrease intake of red meat and other saturated fats and to increase fish intake to twice weekly.  Meats/fish should not be fried -- baked, boiled or broiled is preferable. It is also important to cut back on your sugar intake.  Be sure to read labels - try to avoid anything with added sugar, high fructose corn syrup or other sweeteners.  If you must use a sweetener, you can try stevia or monkfruit.  It is also important to avoid artificially sweetened foods/beverages and diet drinks. Lastly, wear SPF 50 sunscreen on exposed skin and when in direct sunlight for an extended period of time.  Be sure to avoid fast food restaurants and aim for at least 60 ounces of water daily.       Hypertensive heart disease without heart failure Assessment & Plan: Chronic, uncontrolled. Goal BP<120/80.  EKG performed, NSR w/o acute changes.  Encouraged to follow low sodium diet. She will continue with amlodipine  10mg  daily, hydralazine  10mg  three times daily. If elevated at next visit, will consider adding another agent. Metoprolol  discontinued due to QT prolongation. Valsartan avoided due to allergic reaction. Plan to adjust medication for better control and reduced pill burden. - Increase hydralazine  to two tablets  at breakfast and two at dinner. - Schedule follow-up in two weeks for blood pressure check/NV - Follow low sodium diet   Orders: -     CBC -     CMP14+EGFR -     Lipid panel -     POCT URINALYSIS DIP (CLINITEK) -     Microalbumin /  creatinine urine ratio -     EKG 12-Lead  Coronary artery calcification Assessment & Plan: CCTA results reviewed. She is encouraged to follow a heart healthy lifestyle. She is encouraged to aim for at least 150 minutes of exercise/week.  Most recent Cardiology note reviewed.  - continue with ASA and statin therapy     Mixed hyperlipidemia Assessment & Plan: Chronic, LDL goal is less than 70 given coronary artery calcification. Optimal goal is less than 55.  - Continue with rosuvastatin  20mg  - Follow heart healthy lifestyle.   Orders: -     CMP14+EGFR -     Lipid panel -     TSH  QT prolongation Assessment & Plan: QT prolongation contraindicates metoprolol . Avoid medications exacerbating QT prolongation.   Seasonal allergic rhinitis due to pollen Assessment & Plan: - Provide samples of Zyrtec  for trial, instruct to take half a pill nightly - Encouraged to give office a call in 1- 2 weeks to let us  know how she is doing.    Subareolar mass of left breast Assessment & Plan: She has had previous studies including diagnostic mammo and u/s which revealed stable hamartoma.    Other abnormal glucose Assessment & Plan: Previous labs reviewed, her A1c has been elevated in the past. I will check an A1c today. Reminded to avoid refined sugars including sugary drinks/foods and processed meats including bacon, sausages and deli meats.    Orders: -     Hemoglobin A1c  Class 1 obesity due to excess calories with serious comorbidity and body mass index (BMI) of 30.0 to 30.9 in adult Assessment & Plan: Her BMI is acceptable for her demographic. She is encouraged to aim for at least 150 minutes of exercise per week.    Other orders -     Protein  electrophoresis -     Specimen status report    Return in 2 weeks (on 07/14/2024), or NV - bp check AND flu shot, for 1 year physical, 6 month bp. Patient was given opportunity to ask questions. Patient verbalized understanding of the plan and was able to repeat key elements of the plan. All questions were answered to their satisfaction.   I, Kathryn LOISE Slocumb, MD, have reviewed all documentation for this visit. The documentation on 07/10/24 for the exam, diagnosis, procedures, and orders are all accurate and complete.

## 2024-06-30 NOTE — Patient Instructions (Signed)

## 2024-07-01 LAB — CMP14+EGFR
ALT: 19 IU/L (ref 0–32)
AST: 21 IU/L (ref 0–40)
Albumin: 4.1 g/dL (ref 3.8–4.8)
Alkaline Phosphatase: 85 IU/L (ref 44–121)
BUN/Creatinine Ratio: 14 (ref 12–28)
BUN: 12 mg/dL (ref 8–27)
Bilirubin Total: 0.5 mg/dL (ref 0.0–1.2)
CO2: 22 mmol/L (ref 20–29)
Calcium: 9.2 mg/dL (ref 8.7–10.3)
Chloride: 105 mmol/L (ref 96–106)
Creatinine, Ser: 0.83 mg/dL (ref 0.57–1.00)
Globulin, Total: 2.6 g/dL (ref 1.5–4.5)
Glucose: 106 mg/dL — ABNORMAL HIGH (ref 70–99)
Potassium: 3.6 mmol/L (ref 3.5–5.2)
Sodium: 142 mmol/L (ref 134–144)
Total Protein: 6.7 g/dL (ref 6.0–8.5)
eGFR: 75 mL/min/1.73 (ref >59)

## 2024-07-01 LAB — LIPID PANEL
Chol/HDL Ratio: 1.9 ratio (ref 0.0–4.4)
Cholesterol, Total: 195 mg/dL (ref 100–199)
HDL: 105 mg/dL (ref >39)
LDL Chol Calc (NIH): 78 mg/dL (ref 0–99)
Triglycerides: 66 mg/dL (ref 0–149)
VLDL Cholesterol Cal: 12 mg/dL (ref 5–40)

## 2024-07-01 LAB — CBC
Hematocrit: 45.2 % (ref 34.0–46.6)
Hemoglobin: 14.2 g/dL (ref 11.1–15.9)
MCH: 27.8 pg (ref 26.6–33.0)
MCHC: 31.4 g/dL — ABNORMAL LOW (ref 31.5–35.7)
MCV: 89 fL (ref 79–97)
Platelets: 302 x10E3/uL (ref 150–450)
RBC: 5.11 x10E6/uL (ref 3.77–5.28)
RDW: 12.8 % (ref 11.7–15.4)
WBC: 5.1 x10E3/uL (ref 3.4–10.8)

## 2024-07-01 LAB — HEMOGLOBIN A1C
Est. average glucose Bld gHb Est-mCnc: 114 mg/dL
Hgb A1c MFr Bld: 5.6 % (ref 4.8–5.6)

## 2024-07-01 LAB — MICROALBUMIN / CREATININE URINE RATIO
Creatinine, Urine: 184 mg/dL
Microalb/Creat Ratio: 337 mg/g{creat} — ABNORMAL HIGH (ref 0–29)
Microalbumin, Urine: 620 ug/mL

## 2024-07-01 LAB — TSH: TSH: 2.45 u[IU]/mL (ref 0.450–4.500)

## 2024-07-05 LAB — PROTEIN ELECTROPHORESIS
A/G Ratio: 1.3 (ref 0.7–1.7)
Albumin ELP: 3.8 g/dL (ref 2.9–4.4)
Alpha 1: 0.3 g/dL (ref 0.0–0.4)
Alpha 2: 0.7 g/dL (ref 0.4–1.0)
Beta: 1 g/dL (ref 0.7–1.3)
Gamma Globulin: 1.1 g/dL (ref 0.4–1.8)
Globulin, Total: 3 g/dL (ref 2.2–3.9)
Total Protein: 6.8 g/dL (ref 6.0–8.5)

## 2024-07-05 LAB — SPECIMEN STATUS REPORT

## 2024-07-06 ENCOUNTER — Ambulatory Visit
Admission: RE | Admit: 2024-07-06 | Discharge: 2024-07-06 | Disposition: A | Discharge status: Home / Self Care | Source: Ambulatory Visit | Attending: Internal Medicine | Admitting: Internal Medicine

## 2024-07-06 DIAGNOSIS — N281 Cyst of kidney, acquired: Secondary | ICD-10-CM | POA: Diagnosis not present

## 2024-07-06 DIAGNOSIS — R809 Proteinuria, unspecified: Secondary | ICD-10-CM

## 2024-07-07 ENCOUNTER — Other Ambulatory Visit: Payer: Self-pay | Admitting: Internal Medicine

## 2024-07-10 DIAGNOSIS — R9431 Abnormal electrocardiogram [ECG] [EKG]: Secondary | ICD-10-CM | POA: Insufficient documentation

## 2024-07-10 NOTE — Assessment & Plan Note (Signed)
She has had previous studies including diagnostic mammo and u/s which revealed stable hamartoma.

## 2024-07-10 NOTE — Assessment & Plan Note (Signed)
Her BMI is acceptable for her demographic. She is encouraged to aim for at least 150 minutes of exercise per week.

## 2024-07-10 NOTE — Assessment & Plan Note (Addendum)
 Chronic, uncontrolled. Goal BP<120/80.  EKG performed, NSR w/o acute changes.  Encouraged to follow low sodium diet. She will continue with amlodipine  10mg  daily, hydralazine  10mg  three times daily. If elevated at next visit, will consider adding another agent. Metoprolol  discontinued due to QT prolongation. Valsartan avoided due to allergic reaction. Plan to adjust medication for better control and reduced pill burden. - Increase hydralazine  to two tablets at breakfast and two at dinner. - Schedule follow-up in two weeks for blood pressure check/NV - Follow low sodium diet

## 2024-07-10 NOTE — Assessment & Plan Note (Signed)
 Chronic, LDL goal is less than 70 given coronary artery calcification. Optimal goal is less than 55.  - Continue with rosuvastatin  20mg  - Follow heart healthy lifestyle.

## 2024-07-10 NOTE — Assessment & Plan Note (Signed)

## 2024-07-10 NOTE — Assessment & Plan Note (Signed)
 Previous labs reviewed, her A1c has been elevated in the past. I will check an A1c today. Reminded to avoid refined sugars including sugary drinks/foods and processed meats including bacon, sausages and deli meats.

## 2024-07-10 NOTE — Assessment & Plan Note (Signed)
-   Provide samples of Zyrtec  for trial, instruct to take half a pill nightly - Encouraged to give office a call in 1- 2 weeks to let us  know how she is doing.

## 2024-07-10 NOTE — Assessment & Plan Note (Signed)
 QT prolongation contraindicates metoprolol . Avoid medications exacerbating QT prolongation.

## 2024-07-12 ENCOUNTER — Ambulatory Visit: Payer: Self-pay | Admitting: Internal Medicine

## 2024-07-12 ENCOUNTER — Other Ambulatory Visit

## 2024-07-13 ENCOUNTER — Ambulatory Visit (INDEPENDENT_AMBULATORY_CARE_PROVIDER_SITE_OTHER)

## 2024-07-13 VITALS — BP 130/70 | HR 76 | Temp 97.8°F | Ht 61.0 in | Wt 159.0 lb

## 2024-07-13 DIAGNOSIS — Z23 Encounter for immunization: Secondary | ICD-10-CM

## 2024-07-13 DIAGNOSIS — I119 Hypertensive heart disease without heart failure: Secondary | ICD-10-CM

## 2024-07-13 NOTE — Progress Notes (Signed)
 Patient is in office today for a nurse visit for Blood Pressure Check and FLU shot. Patient taking amLODipine  10mg  AM, HydrALAzine  10mg  2X DAILY, patient takes 2 tablets AM and 2 tablets PM.   Patient blood pressure was 160/70, Patient No chest pain, No shortness of breath, No dyspnea on exertion, No orthopnea, No paroxysmal nocturnal dyspnea, No edema, No palpitations, No syncope BP Readings from Last 3 Encounters:  07/13/24 (!) 160/70  06/30/24 (!) 140/80  06/06/24 (!) 184/78   Per provider- f/u 3 weeks NV and no salt.

## 2024-08-01 ENCOUNTER — Other Ambulatory Visit: Payer: Self-pay | Admitting: Internal Medicine

## 2024-08-03 ENCOUNTER — Ambulatory Visit: Payer: Self-pay

## 2024-08-03 VITALS — BP 130/60 | HR 75 | Temp 97.6°F | Ht 61.0 in | Wt 159.0 lb

## 2024-08-03 DIAGNOSIS — I119 Hypertensive heart disease without heart failure: Secondary | ICD-10-CM

## 2024-08-03 MED ORDER — HYDRALAZINE HCL 25 MG PO TABS: 25.0000 mg | ORAL_TABLET | Freq: Two times a day (BID) | ORAL | 1 refills | Status: AC

## 2024-08-03 NOTE — Patient Instructions (Signed)
 Hypertension, Adult Hypertension is another name for high blood pressure. High blood pressure forces your heart to work harder to pump blood. This can cause problems over time. There are two numbers in a blood pressure reading. There is a top number (systolic) over a bottom number (diastolic). It is best to have a blood pressure that is below 120/80. What are the causes? The cause of this condition is not known. Some other conditions can lead to high blood pressure. What increases the risk? Some lifestyle factors can make you more likely to develop high blood pressure: Smoking. Not getting enough exercise or physical activity. Being overweight. Having too much fat, sugar, calories, or salt (sodium) in your diet. Drinking too much alcohol. Other risk factors include: Having any of these conditions: Heart disease. Diabetes. High cholesterol. Kidney disease. Obstructive sleep apnea. Having a family history of high blood pressure and high cholesterol. Age. The risk increases with age. Stress. What are the signs or symptoms? High blood pressure may not cause symptoms. Very high blood pressure (hypertensive crisis) may cause: Headache. Fast or uneven heartbeats (palpitations). Shortness of breath. Nosebleed. Vomiting or feeling like you may vomit (nauseous). Changes in how you see. Very bad chest pain. Feeling dizzy. Seizures. How is this treated? This condition is treated by making healthy lifestyle changes, such as: Eating healthy foods. Exercising more. Drinking less alcohol. Your doctor may prescribe medicine if lifestyle changes do not help enough and if: Your top number is above 130. Your bottom number is above 80. Your personal target blood pressure may vary. Follow these instructions at home: Eating and drinking  If told, follow the DASH eating plan. To follow this plan: Fill one half of your plate at each meal with fruits and vegetables. Fill one fourth of your plate  at each meal with whole grains. Whole grains include whole-wheat pasta, brown rice, and whole-grain bread. Eat or drink low-fat dairy products, such as skim milk or low-fat yogurt. Fill one fourth of your plate at each meal with low-fat (lean) proteins. Low-fat proteins include fish, chicken without skin, eggs, beans, and tofu. Avoid fatty meat, cured and processed meat, or chicken with skin. Avoid pre-made or processed food. Limit the amount of salt in your diet to less than 1,500 mg each day. Do not drink alcohol if: Your doctor tells you not to drink. You are pregnant, may be pregnant, or are planning to become pregnant. If you drink alcohol: Limit how much you have to: 0-1 drink a day for women. 0-2 drinks a day for men. Know how much alcohol is in your drink. In the [place] one drink equals one 12 oz bottle of beer (355 mL), one 5 oz glass of wine (148 mL), or one 1 oz glass of hard liquor (44 mL). Lifestyle  Work with your doctor to stay at a healthy weight or to lose weight. Ask your doctor what the best weight is for you. Get at least 30 minutes of exercise that causes your heart to beat faster (aerobic exercise) most days of the week. This may include walking, swimming, or biking. Get at least 30 minutes of exercise that strengthens your muscles (resistance exercise) at least 3 days a week. This may include lifting weights or doing Pilates. Do not smoke or use any products that contain nicotine or tobacco. If you need help quitting, ask your doctor. Check your blood pressure at home as told by your doctor. Keep all follow-up visits. Medicines Take over-the-counter and prescription medicines  only as told by your doctor. Follow directions carefully. Do not skip doses of blood pressure medicine. The medicine does not work as well if you skip doses. Skipping doses also puts you at risk for problems. Ask your doctor about side effects or reactions to medicines that you should watch  for. Contact a doctor if: You think you are having a reaction to the medicine you are taking. You have headaches that keep coming back. You feel dizzy. You have swelling in your ankles. You have trouble with your vision. Get help right away if: You get a very bad headache. You start to feel mixed up (confused). You feel weak or numb. You feel faint. You have very bad pain in your: Chest. Belly (abdomen). You vomit more than once. You have trouble breathing. These symptoms may be an emergency. Get help right away. Call 911. Do not wait to see if the symptoms will go away. Do not drive yourself to the hospital. Summary Hypertension is another name for high blood pressure. High blood pressure forces your heart to work harder to pump blood. For most people, a normal blood pressure is less than 120/80. Making healthy choices can help lower blood pressure. If your blood pressure does not get lower with healthy choices, you may need to take medicine. This information is not intended to replace advice given to you by your health care provider. Make sure you discuss any questions you have with your health care provider. Document Revised: 08/02/2021 Document Reviewed: 08/02/2021 Elsevier Patient Education  2024 [employer].

## 2024-08-03 NOTE — Progress Notes (Signed)
 Patient presents today for a bpc. Patient reports compliance with her meds. She reports she takes amlodipine  10mg  in the mornings. She is taking hydralazine  10mg  2 tablets in the morning and 2 tablets in the evening. I checked her blood pressure and it was 140/70 P80. I had patient wait 10 minutes and rechecked her blood pressure it was 130/60 P75. Patient advised to continue with her current medications her blood pressure has improved greatly. She is to start hydralazine  25mg  bid once she runs out of her hydralazine  10mg . Patient is to follow up in 8 weeks with provider. YL,RMA   BP Readings from Last 3 Encounters:  07/13/24 130/70  06/30/24 (!) 140/80  06/06/24 (!) 184/78

## 2024-09-07 DIAGNOSIS — E785 Hyperlipidemia, unspecified: Secondary | ICD-10-CM | POA: Diagnosis not present

## 2024-09-07 DIAGNOSIS — I700 Atherosclerosis of aorta: Secondary | ICD-10-CM | POA: Diagnosis not present

## 2024-09-07 DIAGNOSIS — Z8249 Family history of ischemic heart disease and other diseases of the circulatory system: Secondary | ICD-10-CM | POA: Diagnosis not present

## 2024-09-07 DIAGNOSIS — M109 Gout, unspecified: Secondary | ICD-10-CM | POA: Diagnosis not present

## 2024-09-07 DIAGNOSIS — Z833 Family history of diabetes mellitus: Secondary | ICD-10-CM | POA: Diagnosis not present

## 2024-09-07 DIAGNOSIS — M1990 Unspecified osteoarthritis, unspecified site: Secondary | ICD-10-CM | POA: Diagnosis not present

## 2024-09-07 DIAGNOSIS — N189 Chronic kidney disease, unspecified: Secondary | ICD-10-CM | POA: Diagnosis not present

## 2024-09-07 DIAGNOSIS — I25119 Atherosclerotic heart disease of native coronary artery with unspecified angina pectoris: Secondary | ICD-10-CM | POA: Diagnosis not present

## 2024-09-07 DIAGNOSIS — I129 Hypertensive chronic kidney disease with stage 1 through stage 4 chronic kidney disease, or unspecified chronic kidney disease: Secondary | ICD-10-CM | POA: Diagnosis not present

## 2024-09-30 ENCOUNTER — Encounter: Payer: Self-pay | Admitting: Internal Medicine

## 2024-09-30 ENCOUNTER — Ambulatory Visit: Payer: Self-pay | Admitting: Internal Medicine

## 2024-09-30 VITALS — BP 134/80 | HR 80 | Temp 98.3°F | Ht 61.0 in | Wt 164.2 lb

## 2024-09-30 DIAGNOSIS — N281 Cyst of kidney, acquired: Secondary | ICD-10-CM | POA: Diagnosis not present

## 2024-09-30 DIAGNOSIS — I119 Hypertensive heart disease without heart failure: Secondary | ICD-10-CM | POA: Diagnosis not present

## 2024-09-30 DIAGNOSIS — E782 Mixed hyperlipidemia: Secondary | ICD-10-CM

## 2024-09-30 DIAGNOSIS — E2839 Other primary ovarian failure: Secondary | ICD-10-CM | POA: Diagnosis not present

## 2024-09-30 DIAGNOSIS — I2510 Atherosclerotic heart disease of native coronary artery without angina pectoris: Secondary | ICD-10-CM | POA: Diagnosis not present

## 2024-09-30 DIAGNOSIS — R809 Proteinuria, unspecified: Secondary | ICD-10-CM

## 2024-09-30 MED ORDER — ROSUVASTATIN CALCIUM 20 MG PO TABS: 20.0000 mg | ORAL_TABLET | Freq: Every day | ORAL | 3 refills | Status: DC

## 2024-09-30 NOTE — Patient Instructions (Signed)
 Hypertension, Adult Hypertension is another name for high blood pressure. High blood pressure forces your heart to work harder to pump blood. This can cause problems over time. There are two numbers in a blood pressure reading. There is a top number (systolic) over a bottom number (diastolic). It is best to have a blood pressure that is below 120/80. What are the causes? The cause of this condition is not known. Some other conditions can lead to high blood pressure. What increases the risk? Some lifestyle factors can make you more likely to develop high blood pressure: Smoking. Not getting enough exercise or physical activity. Being overweight. Having too much fat, sugar, calories, or salt (sodium) in your diet. Drinking too much alcohol. Other risk factors include: Having any of these conditions: Heart disease. Diabetes. High cholesterol. Kidney disease. Obstructive sleep apnea. Having a family history of high blood pressure and high cholesterol. Age. The risk increases with age. Stress. What are the signs or symptoms? High blood pressure may not cause symptoms. Very high blood pressure (hypertensive crisis) may cause: Headache. Fast or uneven heartbeats (palpitations). Shortness of breath. Nosebleed. Vomiting or feeling like you may vomit (nauseous). Changes in how you see. Very bad chest pain. Feeling dizzy. Seizures. How is this treated? This condition is treated by making healthy lifestyle changes, such as: Eating healthy foods. Exercising more. Drinking less alcohol. Your doctor may prescribe medicine if lifestyle changes do not help enough and if: Your top number is above 130. Your bottom number is above 80. Your personal target blood pressure may vary. Follow these instructions at home: Eating and drinking  If told, follow the DASH eating plan. To follow this plan: Fill one half of your plate at each meal with fruits and vegetables. Fill one fourth of your plate  at each meal with whole grains. Whole grains include whole-wheat pasta, brown rice, and whole-grain bread. Eat or drink low-fat dairy products, such as skim milk or low-fat yogurt. Fill one fourth of your plate at each meal with low-fat (lean) proteins. Low-fat proteins include fish, chicken without skin, eggs, beans, and tofu. Avoid fatty meat, cured and processed meat, or chicken with skin. Avoid pre-made or processed food. Limit the amount of salt in your diet to less than 1,500 mg each day. Do not drink alcohol if: Your doctor tells you not to drink. You are pregnant, may be pregnant, or are planning to become pregnant. If you drink alcohol: Limit how much you have to: 0-1 drink a day for women. 0-2 drinks a day for men. Know how much alcohol is in your drink. In the [place] one drink equals one 12 oz bottle of beer (355 mL), one 5 oz glass of wine (148 mL), or one 1 oz glass of hard liquor (44 mL). Lifestyle  Work with your doctor to stay at a healthy weight or to lose weight. Ask your doctor what the best weight is for you. Get at least 30 minutes of exercise that causes your heart to beat faster (aerobic exercise) most days of the week. This may include walking, swimming, or biking. Get at least 30 minutes of exercise that strengthens your muscles (resistance exercise) at least 3 days a week. This may include lifting weights or doing Pilates. Do not smoke or use any products that contain nicotine or tobacco. If you need help quitting, ask your doctor. Check your blood pressure at home as told by your doctor. Keep all follow-up visits. Medicines Take over-the-counter and prescription medicines  only as told by your doctor. Follow directions carefully. Do not skip doses of blood pressure medicine. The medicine does not work as well if you skip doses. Skipping doses also puts you at risk for problems. Ask your doctor about side effects or reactions to medicines that you should watch  for. Contact a doctor if: You think you are having a reaction to the medicine you are taking. You have headaches that keep coming back. You feel dizzy. You have swelling in your ankles. You have trouble with your vision. Get help right away if: You get a very bad headache. You start to feel mixed up (confused). You feel weak or numb. You feel faint. You have very bad pain in your: Chest. Belly (abdomen). You vomit more than once. You have trouble breathing. These symptoms may be an emergency. Get help right away. Call 911. Do not wait to see if the symptoms will go away. Do not drive yourself to the hospital. Summary Hypertension is another name for high blood pressure. High blood pressure forces your heart to work harder to pump blood. For most people, a normal blood pressure is less than 120/80. Making healthy choices can help lower blood pressure. If your blood pressure does not get lower with healthy choices, you may need to take medicine. This information is not intended to replace advice given to you by your health care provider. Make sure you discuss any questions you have with your health care provider. Document Revised: 08/02/2021 Document Reviewed: 08/02/2021 Elsevier Patient Education  2024 [employer].

## 2024-09-30 NOTE — Progress Notes (Signed)
 I,Kathryn Mendez, CMA,acting as a neurosurgeon for Kathryn LOISE Slocumb, MD.,have documented all relevant documentation on the behalf of Kathryn LOISE Slocumb, MD,as directed by  Kathryn LOISE Slocumb, MD while in the presence of Kathryn LOISE Slocumb, MD.  Subjective:  Patient ID: Kathryn Mendez , female    DOB: May 25, 1951 , 73 y.o.   MRN: [id number]  Chief Complaint  Patient presents with   Hypertension    Patient presents today for bp & cholesterol follow up. She reports compliance with medications. Denies headache, chest pain & sob.    Hyperlipidemia    HPI Discussed the use of AI scribe software for clinical note transcription with the patient, who gave verbal consent to proceed.  History of Present Illness Kathryn Mendez is a 73 year old female who presents for a blood pressure cfollow-up visit.  She experiences nasal congestion and difficulty breathing due to allergies, describing the sensation as 'a biscuit stuffed in my throat.' She uses a home remedy of lemon and honey to alleviate symptoms.  She is currently taking amlodipine , rosuvastatin  daily (although she sometimes skips doses), and hydralazine  10 mg twice a day for blood pressure management. She is allergic to valsartan. She also takes hydralazine  10 mg twice a day for blood pressure management. Her blood pressure at home is sometimes checked and is close to the target of less than 130/80 mmHg.  In September, she had liver and kidney function tests, and today only kidney function is being checked. She had an ultrasound of her kidneys which revealed a complex cyst in the right kidney, and a follow-up ultrasound is planned for March. Her urine appears slightly foamy. No symptoms of a bladder infection.  She has a history of prediabetes, which was normal on the last test, and her thyroid  and cholesterol levels were also normal. She has not had a bone density test in over three years and plans to have one scheduled soon.  She engages in  regular physical activity indoors due to cold weather and tries to stay active by not sitting for more than fifteen minutes at a time.   Hypertension This is a chronic problem. The current episode started more than 1 year ago. The problem has been gradually improving since onset. The problem is controlled. Pertinent negatives include no blurred vision, chest pain, palpitations or shortness of breath. Risk factors for coronary artery disease include dyslipidemia, post-menopausal state, sedentary lifestyle and obesity. The current treatment provides moderate improvement. Hypertensive end-organ damage includes kidney disease.     Past Medical History:  Diagnosis Date   High cholesterol    Hypertension      Family History  Problem Relation Age of Onset   Diabetes Mother    Hypertension Mother    Stroke Mother    Heart attack Father 22   Stroke Father    Diabetes Sister    Breast cancer Neg Hx    BRCA 1/2 Neg Hx      Current Outpatient Medications:    albuterol  (VENTOLIN  HFA) 108 (90 Base) MCG/ACT inhaler, Inhale 2 puffs into the lungs every 4 (four) hours as needed for wheezing or shortness of breath., Disp: 18 g, Rfl: 3   allopurinol  (ZYLOPRIM ) 100 MG tablet, TAKE 1 TABLET(100 MG) BY MOUTH DAILY, Disp: 90 tablet, Rfl: 2   amLODipine  (NORVASC ) 10 MG tablet, Take 1 tablet (10 mg total) by mouth daily., Disp: 90 tablet, Rfl: 3   hydrALAZINE  (APRESOLINE ) 10 MG tablet, TAKE 1 TABLET BY MOUTH  THREE TIMES DAILY, Disp: 270 tablet, Rfl: 1   hydrALAZINE  (APRESOLINE ) 25 MG tablet, Take 1 tablet (25 mg total) by mouth 2 (two) times daily., Disp: 180 tablet, Rfl: 1   meloxicam  (MOBIC ) 15 MG tablet, TAKE 1 TABLET BY MOUTH DAILY AS NEEDED, Disp: 30 tablet, Rfl: 1   nitroGLYCERIN  (NITROSTAT ) 0.4 MG SL tablet, Place 1 tablet (0.4 mg total) under the tongue every 5 (five) minutes x 3 doses as needed for chest pain., Disp: 25 tablet, Rfl: 2   predniSONE  (DELTASONE ) 5 MG tablet, Use as directed (Patient  not taking: Reported on 09/30/2024), Disp: 21 tablet, Rfl: 0   rosuvastatin  (CRESTOR ) 20 MG tablet, Take 1 tablet (20 mg total) by mouth daily., Disp: 90 tablet, Rfl: 3   Allergies  Allergen Reactions   Valsartan Anaphylaxis    angioedema     Review of Systems  Constitutional: Negative.   Eyes:  Negative for blurred vision.  Respiratory: Negative.  Negative for shortness of breath.   Cardiovascular: Negative.  Negative for chest pain and palpitations.  Gastrointestinal: Negative.   Neurological: Negative.   Psychiatric/Behavioral: Negative.       Today's Vitals   09/30/24 1438  BP: 134/80  Pulse: 80  Temp: 98.3 F (36.8 C)  SpO2: 98%  Weight: 164 lb 3.2 oz (74.5 kg)  Height: 5' 1 (1.549 m)   Body mass index is 31.03 kg/m.  Wt Readings from Last 3 Encounters:  09/30/24 164 lb 3.2 oz (74.5 kg)  08/03/24 159 lb (72.1 kg)  07/13/24 159 lb (72.1 kg)    The ASCVD Risk score (Arnett DK, et al., 2019) failed to calculate for the following reasons:   The valid HDL cholesterol range is 20 to 100 mg/dL  Objective:  Physical Exam Vitals and nursing note reviewed.  Constitutional:      Appearance: Normal appearance. She is obese.  HENT:     Head: Normocephalic and atraumatic.  Eyes:     Extraocular Movements: Extraocular movements intact.  Cardiovascular:     Rate and Rhythm: Normal rate and regular rhythm.     Heart sounds: Normal heart sounds.  Pulmonary:     Effort: Pulmonary effort is normal.     Breath sounds: Normal breath sounds.  Musculoskeletal:     Cervical back: Normal range of motion.  Skin:    General: Skin is warm.  Neurological:     General: No focal deficit present.     Mental Status: She is alert.  Psychiatric:        Mood and Affect: Mood normal.        Behavior: Behavior normal.       Assessment And Plan:   Assessment & Plan Hypertensive heart disease without heart failure Blood pressure controlled at 134/80 mmHg. Allergy to valsartan  limits kidney protection options. - Continue amlodipine  and hydralazine  as prescribed. - Encouraged regular exercise, including indoor activities, to maintain blood pressure control. Coronary artery calcification CCTA results reviewed. She is encouraged to follow a heart healthy lifestyle. She is encouraged to aim for at least 150 minutes of exercise/week.  Most recent Cardiology note reviewed.  - continue with ASA and statin therapy - continue with rosuvastatin .   Mixed hyperlipidemia Chronic, LDL goal is less than 70 given coronary artery calcification. Optimal goal is less than 55.  - Continue with rosuvastatin  20mg  - Follow heart healthy lifestyle.  Complex renal cyst Seen on renal u/s September 2025. Advised to repeat ultrasound in six months for re-evaluation  and to confirm stability.  - Schedule repeat ultrasound in March 2026. c Proteinuria, unspecified type Allergy to valsartan limits kidney protection options. - Ordered blood work to assess kidney function and determine suitability for alternative kidney protection medication. - Ordered urinalysis to recheck proteinuria and white cells. - Schedule follow-up ultrasound of kidneys in March. - Consider referral to a kidney specialist based on lab results.  Estrogen deficiency I will refer her to [facility] for bone density.     Orders Placed This Encounter  Procedures   Microscopic Examination   US  Renal   DG Bone Density   BMP8+EGFR   Urinalysis, Complete (81001)     Return if symptoms worsen or fail to improve.  Patient was given opportunity to ask questions. Patient verbalized understanding of the plan and was able to repeat key elements of the plan. All questions were answered to their satisfaction.    I, Kathryn LOISE Slocumb, MD, have reviewed all documentation for this visit. The documentation on 09/30/24 for the exam, diagnosis, procedures, and orders are all accurate and complete.   IF YOU HAVE BEEN REFERRED TO A  SPECIALIST, IT MAY TAKE 1-2 WEEKS TO SCHEDULE/PROCESS THE REFERRAL. IF YOU HAVE NOT HEARD FROM US /SPECIALIST IN TWO WEEKS, PLEASE GIVE US  A CALL AT [phone].

## 2024-10-01 LAB — BMP8+EGFR
BUN/Creatinine Ratio: 15 (ref 12–28)
BUN: 13 mg/dL (ref 8–27)
CO2: 24 mmol/L (ref 20–29)
Calcium: 9 mg/dL (ref 8.7–10.3)
Chloride: 103 mmol/L (ref 96–106)
Creatinine, Ser: 0.84 mg/dL (ref 0.57–1.00)
Glucose: 91 mg/dL (ref 70–99)
Potassium: 3.7 mmol/L (ref 3.5–5.2)
Sodium: 142 mmol/L (ref 134–144)
eGFR: 73 mL/min/1.73 (ref >59)

## 2024-10-01 LAB — URINALYSIS, COMPLETE
Bilirubin, UA: NEGATIVE
Glucose, UA: NEGATIVE
Ketones, UA: NEGATIVE
Nitrite, UA: POSITIVE — AB
RBC, UA: NEGATIVE
Specific Gravity, UA: 1.017 (ref 1.005–1.030)
Urobilinogen, Ur: 0.2 mg/dL (ref 0.2–1.0)
pH, UA: 6 (ref 5.0–7.5)

## 2024-10-01 LAB — MICROSCOPIC EXAMINATION
Casts: NONE SEEN /LPF
RBC, Urine: NONE SEEN /HPF (ref 0–2)

## 2024-10-03 NOTE — Assessment & Plan Note (Signed)
 CCTA results reviewed. She is encouraged to follow a heart healthy lifestyle. She is encouraged to aim for at least 150 minutes of exercise/week.  Most recent Cardiology note reviewed.  - continue with ASA and statin therapy - continue with rosuvastatin .

## 2024-10-03 NOTE — Assessment & Plan Note (Signed)
 Blood pressure controlled at 134/80 mmHg. Allergy to valsartan limits kidney protection options. - Continue amlodipine  and hydralazine  as prescribed. - Encouraged regular exercise, including indoor activities, to maintain blood pressure control.

## 2024-10-03 NOTE — Assessment & Plan Note (Signed)
 Chronic, LDL goal is less than 70 given coronary artery calcification. Optimal goal is less than 55.  - Continue with rosuvastatin  20mg  - Follow heart healthy lifestyle.

## 2024-10-09 ENCOUNTER — Other Ambulatory Visit: Payer: Self-pay | Admitting: Family Medicine

## 2024-10-09 DIAGNOSIS — E782 Mixed hyperlipidemia: Secondary | ICD-10-CM

## 2024-10-09 DIAGNOSIS — I119 Hypertensive heart disease without heart failure: Secondary | ICD-10-CM

## 2024-10-14 ENCOUNTER — Ambulatory Visit: Payer: Self-pay | Admitting: Internal Medicine

## 2024-10-14 MED ORDER — NITROFURANTOIN MONOHYD MACRO 100 MG PO CAPS: 100.0000 mg | ORAL_CAPSULE | Freq: Two times a day (BID) | ORAL | 0 refills | Status: AC

## 2024-12-01 ENCOUNTER — Ambulatory Visit: Payer: Medicare HMO

## 2024-12-01 VITALS — BP 126/60 | HR 88 | Temp 98.0°F | Ht 62.0 in | Wt 162.6 lb

## 2024-12-01 DIAGNOSIS — Z0000 Encounter for general adult medical examination without abnormal findings: Secondary | ICD-10-CM

## 2024-12-01 NOTE — Patient Instructions (Signed)
 Kathryn Mendez,  Thank you for taking the time for your Medicare Wellness Visit. I appreciate your continued commitment to your health goals. Please review the care plan we discussed, and feel free to reach out if I can assist you further.  Please note that Annual Wellness Visits do not include a physical exam. Some assessments may be limited, especially if the visit was conducted virtually. If needed, we may recommend an in-person follow-up with your provider.  Ongoing Care Seeing your primary care provider every 3 to 6 months helps us  monitor your health and provide consistent, personalized care.   Referrals If a referral was made during today's visit and you haven't received any updates within two weeks, please contact the referred provider directly to check on the status.  Recommended Screenings:  Health Maintenance  Topic Date Due   Medicare Annual Wellness Visit  11/11/2024   COVID-19 Vaccine (5 - 2025-26 season) 12/17/2024*   Breast Cancer Screening  03/12/2026   Colon Cancer Screening  05/27/2033   DTaP/Tdap/Td vaccine (3 - Td or Tdap) 11/11/2033   Pneumococcal Vaccine for age over 42  Completed   Flu Shot  Completed   Osteoporosis screening with Bone Density Scan  Completed   Hepatitis C Screening  Completed   Zoster (Shingles) Vaccine  Completed   Meningitis B Vaccine  Aged Out  *Topic was postponed. The date shown is not the original due date.       12/01/2024   11:03 AM  Advanced Directives  Does Patient Have a Medical Advance Directive? No  Would patient like information on creating a medical advance directive? No - Patient declined    Vision: Annual vision screenings are recommended for early detection of glaucoma, cataracts, and diabetic retinopathy. These exams can also reveal signs of chronic conditions such as diabetes and high blood pressure.  Dental: Annual dental screenings help detect early signs of oral cancer, gum disease, and other conditions linked to  overall health, including heart disease and diabetes.  Please see the attached documents for additional preventive care recommendations.

## 2024-12-01 NOTE — Progress Notes (Signed)
 "  Chief Complaint  Patient presents with   Medicare Wellness     Subjective:   Kathryn Mendez is a 74 y.o. female who presents for a Medicare Annual Wellness Visit.  Visit info / Clinical Intake: Medicare Wellness Visit Type:: Subsequent Annual Wellness Visit Persons participating in visit and providing information:: patient Medicare Wellness Visit Mode:: In-person (required for WTM) Interpreter Needed?: No Pre-visit prep was completed: yes AWV questionnaire completed by patient prior to visit?: no Living arrangements:: (!) lives alone Patient's Overall Health Status Rating: good Typical amount of pain: none Does pain affect daily life?: no Are you currently prescribed opioids?: no  Dietary Habits and Nutritional Risks How many meals a day?: 2 Eats fruit and vegetables daily?: yes Most meals are obtained by: preparing own meals In the last 2 weeks, have you had any of the following?: none Diabetic:: no  Functional Status Activities of Daily Living (to include ambulation/medication): Independent Ambulation: Independent Medication Administration: Independent Home Management (perform basic housework or laundry): Independent Manage your own finances?: yes Primary transportation is: driving Concerns about vision?: no *vision screening is required for WTM* Concerns about hearing?: no  Fall Screening Falls in the past year?: 1 (standing on the bed hanging curtain) Number of falls in past year: 0 Was there an injury with Fall?: 1 (cut open hand) Fall Risk Category Calculator: 2 Patient Fall Risk Level: Moderate Fall Risk  Fall Risk Patient at Risk for Falls Due to: Medication side effect Fall risk Follow up: Falls prevention discussed; Education provided; Falls evaluation completed  Home and Transportation Safety: All rugs have non-skid backing?: yes All stairs or steps have railings?: N/A, no stairs Grab bars in the bathtub or shower?: (!) no Have non-skid  surface in bathtub or shower?: (!) no Good home lighting?: yes Regular seat belt use?: yes Hospital stays in the last year:: no  Cognitive Assessment Difficulty concentrating, remembering, or making decisions? : yes Will 6CIT or Mini Cog be Completed: yes What year is it?: 0 points What month is it?: 0 points Give patient an address phrase to remember (5 components): [address] [place] About what time is it?: 3 points Count backwards from 20 to 1: 0 points Say the months of the year in reverse: 4 points Repeat the address phrase from earlier: 4 points 6 CIT Score: 11 points  Advance Directives (For Healthcare) Does Patient Have a Medical Advance Directive?: No Would patient like information on creating a medical advance directive?: No - Patient declined  Reviewed/Updated  Reviewed/Updated: Reviewed All (Medical, Surgical, Family, Medications, Allergies, Care Teams, Patient Goals)    Allergies (verified) Valsartan   Current Medications (verified) Outpatient Encounter Medications as of 12/01/2024  Medication Sig   albuterol  (VENTOLIN  HFA) 108 (90 Base) MCG/ACT inhaler Inhale 2 puffs into the lungs every 4 (four) hours as needed for wheezing or shortness of breath.   allopurinol  (ZYLOPRIM ) 100 MG tablet TAKE 1 TABLET(100 MG) BY MOUTH DAILY   amLODipine  (NORVASC ) 10 MG tablet TAKE 1 TABLET(10 MG) BY MOUTH DAILY   hydrALAZINE  (APRESOLINE ) 25 MG tablet Take 1 tablet (25 mg total) by mouth 2 (two) times daily.   meloxicam  (MOBIC ) 15 MG tablet TAKE 1 TABLET BY MOUTH DAILY AS NEEDED   nitroGLYCERIN  (NITROSTAT ) 0.4 MG SL tablet Place 1 tablet (0.4 mg total) under the tongue every 5 (five) minutes x 3 doses as needed for chest pain.   rosuvastatin  (CRESTOR ) 20 MG tablet TAKE 1 TABLET(20 MG) BY MOUTH DAILY  hydrALAZINE  (APRESOLINE ) 10 MG tablet TAKE 1 TABLET BY MOUTH THREE TIMES DAILY (Patient not taking: Reported on 12/01/2024)   predniSONE  (DELTASONE ) 5 MG tablet Use as directed  (Patient not taking: Reported on 12/01/2024)   No facility-administered encounter medications on file as of 12/01/2024.    History: Past Medical History:  Diagnosis Date   High cholesterol    Hypertension    Past Surgical History:  Procedure Laterality Date   cataract surgery Bilateral 2024   Dr. Roz   CHOLECYSTECTOMY     TUBAL LIGATION     Family History  Problem Relation Age of Onset   Diabetes Mother    Hypertension Mother    Stroke Mother    Heart attack Father 77   Stroke Father    Diabetes Sister    Breast cancer Neg Hx    BRCA 1/2 Neg Hx    Social History   Occupational History   Occupation: retired  Tobacco Use   Smoking status: Never   Smokeless tobacco: Never  Vaping Use   Vaping status: Never Used  Substance and Sexual Activity   Alcohol use: Not Currently    Comment: occasional   Drug use: No   Sexual activity: Yes   Tobacco Counseling Counseling given: Not Answered  SDOH Screenings   Food Insecurity: Food Insecurity Present (12/01/2024)  Housing: Low Risk (12/01/2024)  Transportation Needs: No Transportation Needs (12/01/2024)  Utilities: Not At Risk (12/01/2024)  Alcohol Screen: Low Risk (12/01/2024)  Depression (PHQ2-9): Low Risk (12/01/2024)  Financial Resource Strain: Medium Risk (12/01/2024)  Physical Activity: Insufficiently Active (12/01/2024)  Social Connections: Moderately Isolated (12/01/2024)  Stress: No Stress Concern Present (12/01/2024)  Tobacco Use: Low Risk (12/01/2024)  Health Literacy: Adequate Health Literacy (12/01/2024)   See flowsheets for full screening details  Depression Screen PHQ 2 & 9 Depression Scale- Over the past 2 weeks, how often have you been bothered by any of the following problems? Little interest or pleasure in doing things: 0 Feeling down, depressed, or hopeless (PHQ Adolescent also includes...irritable): 0 PHQ-2 Total Score: 0 Trouble falling or staying asleep, or sleeping too much: 0 Feeling tired or having little  energy: 0 Poor appetite or overeating (PHQ Adolescent also includes...weight loss): 0 Feeling bad about yourself - or that you are a failure or have let yourself or your family down: 0 Trouble concentrating on things, such as reading the newspaper or watching television (PHQ Adolescent also includes...like school work): 0 Moving or speaking so slowly that other people could have noticed. Or the opposite - being so fidgety or restless that you have been moving around a lot more than usual: 0 Thoughts that you would be better off dead, or of hurting yourself in some way: 0 PHQ-9 Total Score: 0 If you checked off any problems, how difficult have these problems made it for you to do your work, take care of things at home, or get along with other people?: Not difficult at all  Depression Treatment Depression Interventions/Treatment : EYV7-0 Score <4 Follow-up Not Indicated     Goals Addressed               This Visit's Progress     stay active and eat healthy (pt-stated)               Objective:    Today's Vitals   12/01/24 1052 12/01/24 1114  BP: 138/60 126/60  Pulse: 88   Temp: 98 F (36.7 C)   TempSrc: Oral  SpO2: 99%   Weight: 162 lb 9.6 oz (73.8 kg)   Height: 5' 2 (1.575 m)    Body mass index is 29.74 kg/m.  Hearing/Vision screen Vision Screening - Comments:: Regular eye exams Immunizations and Health Maintenance Health Maintenance  Topic Date Due   COVID-19 Vaccine (5 - 2025-26 season) 12/17/2024 (Originally 06/28/2024)   Medicare Annual Wellness (AWV)  12/01/2025   Mammogram  03/12/2026   Colonoscopy  05/27/2033   DTaP/Tdap/Td (3 - Td or Tdap) 11/11/2033   Pneumococcal Vaccine: 50+ Years  Completed   Influenza Vaccine  Completed   Bone Density Scan  Completed   Hepatitis C Screening  Completed   Zoster Vaccines- Shingrix   Completed   Meningococcal B Vaccine  Aged Out        Assessment/Plan:  This is a routine wellness examination for  Jamaiya.  Patient Care Team: Jarold Medici, MD as PCP - General (Internal Medicine) Lavona Agent, MD as PCP - Cardiology (Cardiology)  I have personally reviewed and noted the following in the patients chart:   Medical and social history Use of alcohol, tobacco or illicit drugs  Current medications and supplements including opioid prescriptions. Functional ability and status Nutritional status Physical activity Advanced directives List of other physicians Hospitalizations, surgeries, and ER visits in previous 12 months Vitals Screenings to include cognitive, depression, and falls Referrals and appointments  No orders of the defined types were placed in this encounter.  In addition, I have reviewed and discussed with patient certain preventive protocols, quality metrics, and best practice recommendations. A written personalized care plan for preventive services as well as general preventive health recommendations were provided to patient.   Ardella FORBES Dawn, LPN   05/01/7972   Return in 1 year (on 12/01/2025).  After Visit Summary: (In Person-Printed) AVS printed and given to the patient  Nurse Notes: No voiced or noted concerns at this time  "

## 2025-01-03 ENCOUNTER — Ambulatory Visit: Payer: Self-pay | Admitting: Internal Medicine

## 2025-01-06 ENCOUNTER — Other Ambulatory Visit

## 2025-07-06 ENCOUNTER — Encounter: Payer: Self-pay | Admitting: Internal Medicine

## 2025-12-14 ENCOUNTER — Ambulatory Visit: Payer: Self-pay
